# Patient Record
Sex: Female | Born: 1955
Health system: Southern US, Community
[De-identification: ages and names within clinical notes are randomized; demographics above are authoritative.]

## PROBLEM LIST (undated history)

## (undated) DIAGNOSIS — M1990 Unspecified osteoarthritis, unspecified site: Secondary | ICD-10-CM

## (undated) DIAGNOSIS — F329 Major depressive disorder, single episode, unspecified: Secondary | ICD-10-CM

## (undated) DIAGNOSIS — F32A Depression, unspecified: Secondary | ICD-10-CM

## (undated) DIAGNOSIS — B009 Herpesviral infection, unspecified: Secondary | ICD-10-CM

## (undated) DIAGNOSIS — M4302 Spondylolysis, cervical region: Secondary | ICD-10-CM

## (undated) DIAGNOSIS — E785 Hyperlipidemia, unspecified: Secondary | ICD-10-CM

## (undated) DIAGNOSIS — M419 Scoliosis, unspecified: Secondary | ICD-10-CM

## (undated) HISTORY — DX: Herpesviral infection, unspecified: B00.9

## (undated) HISTORY — DX: Depression, unspecified: F32.A

## (undated) HISTORY — PX: OTHER SURGICAL HISTORY: SHX169

## (undated) HISTORY — PX: PLACEMENT OF BREAST IMPLANTS: SHX6334

## (undated) HISTORY — DX: Major depressive disorder, single episode, unspecified: F32.9

---

## 1999-01-22 ENCOUNTER — Other Ambulatory Visit: Admission: RE | Admit: 1999-01-22 | Discharge: 1999-01-22 | Payer: Self-pay | Admitting: Obstetrics & Gynecology

## 2000-04-16 ENCOUNTER — Other Ambulatory Visit: Admission: RE | Admit: 2000-04-16 | Discharge: 2000-04-16 | Payer: Self-pay | Admitting: Obstetrics & Gynecology

## 2001-04-13 ENCOUNTER — Other Ambulatory Visit: Admission: RE | Admit: 2001-04-13 | Discharge: 2001-04-13 | Payer: Self-pay | Admitting: Obstetrics & Gynecology

## 2001-06-11 ENCOUNTER — Ambulatory Visit (HOSPITAL_COMMUNITY): Admission: RE | Admit: 2001-06-11 | Discharge: 2001-06-11 | Payer: Self-pay | Admitting: *Deleted

## 2001-06-11 ENCOUNTER — Encounter: Payer: Self-pay | Admitting: *Deleted

## 2002-03-22 ENCOUNTER — Other Ambulatory Visit: Admission: RE | Admit: 2002-03-22 | Discharge: 2002-03-22 | Payer: Self-pay | Admitting: Obstetrics & Gynecology

## 2002-06-10 ENCOUNTER — Observation Stay (HOSPITAL_COMMUNITY): Admission: AD | Admit: 2002-06-10 | Discharge: 2002-06-11 | Payer: Self-pay | Admitting: Obstetrics and Gynecology

## 2002-06-10 ENCOUNTER — Encounter: Payer: Self-pay | Admitting: Emergency Medicine

## 2003-07-05 ENCOUNTER — Other Ambulatory Visit: Admission: RE | Admit: 2003-07-05 | Discharge: 2003-07-05 | Payer: Self-pay | Admitting: Obstetrics & Gynecology

## 2004-02-13 ENCOUNTER — Encounter: Admission: RE | Admit: 2004-02-13 | Discharge: 2004-02-13 | Payer: Self-pay | Admitting: Family Medicine

## 2004-08-17 ENCOUNTER — Ambulatory Visit (HOSPITAL_COMMUNITY): Admission: RE | Admit: 2004-08-17 | Discharge: 2004-08-17 | Payer: Self-pay | Admitting: Orthopedic Surgery

## 2004-08-23 ENCOUNTER — Other Ambulatory Visit: Admission: RE | Admit: 2004-08-23 | Discharge: 2004-08-23 | Payer: Self-pay | Admitting: *Deleted

## 2004-09-07 ENCOUNTER — Ambulatory Visit (HOSPITAL_COMMUNITY): Admission: RE | Admit: 2004-09-07 | Discharge: 2004-09-07 | Payer: Self-pay | Admitting: Obstetrics and Gynecology

## 2004-09-10 ENCOUNTER — Other Ambulatory Visit: Admission: RE | Admit: 2004-09-10 | Discharge: 2004-09-10 | Payer: Self-pay | Admitting: *Deleted

## 2004-10-31 ENCOUNTER — Other Ambulatory Visit: Admission: RE | Admit: 2004-10-31 | Discharge: 2004-10-31 | Payer: Self-pay | Admitting: Obstetrics and Gynecology

## 2005-01-14 HISTORY — PX: APPENDECTOMY: SHX54

## 2005-07-12 ENCOUNTER — Other Ambulatory Visit: Admission: RE | Admit: 2005-07-12 | Discharge: 2005-07-12 | Payer: Self-pay | Admitting: Obstetrics and Gynecology

## 2005-07-12 ENCOUNTER — Other Ambulatory Visit: Admission: RE | Admit: 2005-07-12 | Discharge: 2005-07-12 | Payer: Self-pay | Admitting: *Deleted

## 2005-11-08 ENCOUNTER — Other Ambulatory Visit: Admission: RE | Admit: 2005-11-08 | Discharge: 2005-11-08 | Payer: Self-pay | Admitting: Obstetrics and Gynecology

## 2005-11-26 ENCOUNTER — Ambulatory Visit: Admission: RE | Admit: 2005-11-26 | Discharge: 2005-11-26 | Payer: Self-pay | Admitting: Gynecologic Oncology

## 2005-12-24 ENCOUNTER — Ambulatory Visit: Admission: RE | Admit: 2005-12-24 | Discharge: 2005-12-24 | Payer: Self-pay | Admitting: Gynecologic Oncology

## 2006-01-14 ENCOUNTER — Inpatient Hospital Stay (HOSPITAL_COMMUNITY): Admission: RE | Admit: 2006-01-14 | Discharge: 2006-01-21 | Payer: Self-pay | Admitting: Obstetrics & Gynecology

## 2006-01-14 HISTORY — PX: ABDOMINAL HYSTERECTOMY: SHX81

## 2006-02-06 ENCOUNTER — Ambulatory Visit (HOSPITAL_COMMUNITY): Admission: RE | Admit: 2006-02-06 | Discharge: 2006-02-06 | Payer: Self-pay | Admitting: Gynecologic Oncology

## 2006-02-09 ENCOUNTER — Emergency Department (HOSPITAL_COMMUNITY): Admission: EM | Admit: 2006-02-09 | Discharge: 2006-02-09 | Payer: Self-pay | Admitting: Emergency Medicine

## 2006-02-12 ENCOUNTER — Ambulatory Visit: Admission: RE | Admit: 2006-02-12 | Discharge: 2006-02-13 | Payer: Self-pay | Admitting: Obstetrics and Gynecology

## 2006-03-04 ENCOUNTER — Ambulatory Visit: Admission: RE | Admit: 2006-03-04 | Discharge: 2006-03-04 | Payer: Self-pay | Admitting: Gynecologic Oncology

## 2006-04-14 HISTORY — PX: VAGINAL PROLAPSE REPAIR: SHX830

## 2012-03-24 ENCOUNTER — Encounter (HOSPITAL_COMMUNITY): Payer: Self-pay | Admitting: Emergency Medicine

## 2012-03-24 ENCOUNTER — Emergency Department (HOSPITAL_COMMUNITY): Payer: Self-pay

## 2012-03-24 ENCOUNTER — Emergency Department (HOSPITAL_COMMUNITY)
Admission: EM | Admit: 2012-03-24 | Discharge: 2012-03-24 | Disposition: A | Discharge status: Home / Self Care | Payer: Self-pay | Attending: Emergency Medicine | Admitting: Emergency Medicine

## 2012-03-24 DIAGNOSIS — M5090 Cervical disc disorder, unspecified, unspecified cervical region: Secondary | ICD-10-CM | POA: Insufficient documentation

## 2012-03-24 DIAGNOSIS — R209 Unspecified disturbances of skin sensation: Secondary | ICD-10-CM | POA: Insufficient documentation

## 2012-03-24 DIAGNOSIS — M542 Cervicalgia: Secondary | ICD-10-CM | POA: Insufficient documentation

## 2012-03-24 MED ORDER — HYDROCODONE-ACETAMINOPHEN 5-325 MG PO TABS: 1.0000 | ORAL_TABLET | ORAL | Status: AC | PRN

## 2012-03-24 MED ORDER — OXYCODONE-ACETAMINOPHEN 5-325 MG PO TABS
1.0000 | ORAL_TABLET | Freq: Once | ORAL | Status: AC
  Administered 2012-03-24: 1 via ORAL
  Filled 2012-03-24: qty 1

## 2012-03-24 MED ORDER — PREDNISONE 10 MG PO TABS: 40.0000 mg | ORAL_TABLET | Freq: Every day | ORAL | Status: DC

## 2012-03-24 MED ORDER — DIAZEPAM 5 MG PO TABS
5.0000 mg | ORAL_TABLET | Freq: Once | ORAL | Status: AC
  Administered 2012-03-24: 5 mg via ORAL
  Filled 2012-03-24: qty 1

## 2012-03-24 MED ORDER — METHOCARBAMOL 500 MG PO TABS: 500.0000 mg | ORAL_TABLET | Freq: Four times a day (QID) | ORAL | Status: AC | PRN

## 2012-03-24 NOTE — Progress Notes (Signed)
Discussed the importance of a pcp for f/u. Reviewed [facility] number to assist with finding self pay provider close to her residence. Reviewed resources for medications, housing, DSS, health Department and other resources in [place] Pt voiced understanding and appreciation of resources provided

## 2012-03-24 NOTE — ED Provider Notes (Signed)
Medical screening examination/treatment/procedure(s) were performed by non-physician practitioner and as supervising physician I was immediately available for consultation/collaboration.  Ethelda Chick, MD 03/24/12 [phone]

## 2012-03-24 NOTE — ED Provider Notes (Signed)
History     CSN: [id number]  Arrival date & time 03/24/12  0906   First MD Initiated Contact with Patient 03/24/12 [phone]      No chief complaint on file.   (Consider location/radiation/quality/duration/timing/severity/associated sxs/prior treatment) The history is provided by the patient.  Pt is a generally healthy 55 y/o F who presents to the ED with c/c of sudden onset left-sided neck pain that began when she woke up on 3/24. There was no known injury. The pain is sharp, non-radiating, moderate severity. There is assoc subjective numbness to the skin of the neck over the place of most severe pain, though she denies any extremity weakness or numbness. Has been taking naproxen and using intermittent heat and ice without much relief. Denies fever, chills, HA, IV drug use, or hx cancer.  History reviewed. No pertinent past medical history.  Past Surgical History  Procedure Date  . Appendectomy   . Abdominal hysterectomy     No family history on file.  History  Substance Use Topics  . Smoking status: Never Smoker   . Smokeless tobacco: Not on file  . Alcohol Use: No     Review of Systems  HENT: Positive for neck pain.   All other systems reviewed and are negative.    Allergies  Sulfa antibiotics  Home Medications   Current Outpatient Rx  Name Route Sig Dispense Refill  . AMPHETAMINE-DEXTROAMPHETAMINE 20 MG PO TABS Oral Take 20 mg by mouth 3 (three) times daily.    Marland Kitchen FLUOXETINE HCL 40 MG PO CAPS Oral Take 40 mg by mouth daily.    Marland Kitchen NAPROXEN 250 MG PO TABS Oral Take 250 mg by mouth 2 (two) times daily with a meal. Pain    . VITAMIN E 1000 UNITS PO CAPS Oral Take 1,000 Units by mouth daily.      BP 144/80  Pulse 81  Temp 98.3 F (36.8 C)  Resp 22  SpO2 100%  Physical Exam  Nursing note and vitals reviewed. Constitutional: She is oriented to person, place, and time. She appears well-developed and well-nourished. No distress.  HENT:  Head: Normocephalic and  atraumatic.  Right Ear: External ear normal.  Left Ear: External ear normal.  Mouth/Throat: Oropharynx is clear and moist.  Eyes: Conjunctivae are normal. Pupils are equal, round, and reactive to light.  Neck: Normal range of motion. Neck supple.       See MSK exam  Cardiovascular: Normal rate, regular rhythm and normal heart sounds.   Pulmonary/Chest: Effort normal and breath sounds normal. No respiratory distress. She has no wheezes. She exhibits no tenderness.  Abdominal: Soft. Bowel sounds are normal. She exhibits no distension. There is no tenderness.  Musculoskeletal: Normal range of motion. She exhibits no edema.       Cervical back: She exhibits normal range of motion, no bony tenderness, no deformity, no spasm and normal pulse.       Back:       Increased pain with side flexion of neck. No change in pain or pain/tingling to the extremities with axial loading.  Lymphadenopathy:    She has no cervical adenopathy.  Neurological: She is alert and oriented to person, place, and time. No cranial nerve deficit.  Skin: Skin is warm and dry. No rash noted.  Psychiatric: She has a normal mood and affect.    ED Course  Procedures (including critical care time)  Labs Reviewed - No data to display Dg Cervical Spine Complete  03/24/2012  *RADIOLOGY  REPORT*  Clinical Data: Left-sided neck pain.  No injury.  CERVICAL SPINE - COMPLETE 4+ VIEW  Comparison: None.  Findings: The cervical spine is visualized the skull base through the cervicothoracic junction.  There is significant loss of disc height and endplate changes at C5-6 and C6-7.  Degenerative anterolisthesis and exaggerated flexion is present at C4-5. Osteophyte formation and facet spurring leads to bilateral foraminal narrowing at C5-6.  The lung apices are clear.  IMPRESSION:  1.  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 This could certainly account for the patient's symptoms. 2.   No acute fracture or traumatic subluxation.   Original Report Authenticated By: Jamesetta Orleans. MATTERN, M.D.     Dx 1: neck pain   MDM  Neck pain with NKI. Examination without neuro or motor deficit. Pain reproducible. X-ray without evidence of acute changes, though spondylosis could be contributing to pt pain. I suspect mainly muscular component with inflammation causing prolonged course. Will give rx for pain medication as well as short course of steroids.        Shaaron Adler, PA-C 03/24/12 1200

## 2012-03-24 NOTE — Discharge Instructions (Signed)
Cervical Radiculopathy Cervical radiculopathy happens when a nerve in the neck is pinched or bruised by a slipped (herniated) disk or by arthritic changes in the bones of the cervical spine. This can occur due to an injury or as part of the normal aging process. Pressure on the cervical nerves can cause pain or numbness that runs from your neck all the way down into your arm and fingers. CAUSES  There are many possible causes, including:  Injury.   Muscle tightness in the neck from overuse.   Swollen, painful joints (arthritis).   Breakdown or degeneration in the bones and joints of the spine (spondylosis) due to aging.   Bone spurs that may develop near the cervical nerves.  SYMPTOMS  Symptoms include pain, weakness, or numbness in the affected arm and hand. Pain can be severe or irritating. Symptoms may be worse when extending or turning the neck. DIAGNOSIS  Your caregiver will ask about your symptoms and do a physical exam. He or she may test your strength and reflexes. X-rays, CT scans, and MRI scans may be needed in cases of injury or if the symptoms do not go away after a period of time. Electromyography (EMG) or nerve conduction testing may be done to study how your nerves and muscles are working. TREATMENT  Your caregiver may recommend certain exercises to help relieve your symptoms. Cervical radiculopathy can, and often does, get better with time and treatment. If your problems continue, treatment options may include:  Wearing a soft collar for short periods of time.   Physical therapy to strengthen the neck muscles.   Medicines, such as nonsteroidal anti-inflammatory drugs (NSAIDs), oral corticosteroids, or spinal injections.   Surgery. Different types of surgery may be done depending on the cause of your problems.  HOME CARE INSTRUCTIONS   Put ice on the affected area.   Put ice in a plastic bag.   Place a towel between your skin and the bag.   Leave the ice on for 15  to 20 minutes, 3 to 4 times a day or as directed by your caregiver.   Use a flat pillow when you sleep.   Only take over-the-counter or prescription medicines for pain, discomfort, or fever as directed by your caregiver.   If physical therapy was prescribed, follow your caregiver's directions.   If a soft collar was prescribed, use it as directed.  SEEK IMMEDIATE MEDICAL CARE IF:   Your pain gets much worse and cannot be controlled with medicines.   You have weakness or numbness in your hand, arm, face, or leg.   You have a high fever or a stiff, rigid neck.   You lose bowel or bladder control (incontinence).   You have trouble with walking, balance, or speaking.  MAKE SURE YOU:   Understand these instructions.   Will watch your condition.   Will get help right away if you are not doing well or get worse.  Document Released: 09/03/2001 Document Revised: 11/28/2011 Document Reviewed: 07/23/2011 [employer] Patient Information 2012 [employer]. 

## 2012-03-24 NOTE — ED Notes (Signed)
Pt reports constant neck pain that started on March 24th and is a 7/10 and can be worse at times especially at night. Pain makes it difficult to sleep. Pain does not radiate and she reports some numbness in the area. Pt able to ambulate without difficulty and in NAD.

## 2012-03-27 ENCOUNTER — Emergency Department (HOSPITAL_COMMUNITY)
Admission: EM | Admit: 2012-03-27 | Discharge: 2012-03-27 | Discharge status: Against Medical Advice | Payer: Self-pay | Attending: Emergency Medicine | Admitting: Emergency Medicine

## 2012-03-27 ENCOUNTER — Other Ambulatory Visit: Payer: Self-pay

## 2012-03-27 ENCOUNTER — Encounter (HOSPITAL_COMMUNITY): Payer: Self-pay | Admitting: Family Medicine

## 2012-03-27 DIAGNOSIS — M79609 Pain in unspecified limb: Secondary | ICD-10-CM | POA: Insufficient documentation

## 2012-03-27 DIAGNOSIS — M542 Cervicalgia: Secondary | ICD-10-CM | POA: Insufficient documentation

## 2012-03-27 DIAGNOSIS — R209 Unspecified disturbances of skin sensation: Secondary | ICD-10-CM | POA: Insufficient documentation

## 2012-03-27 HISTORY — DX: Scoliosis, unspecified: M41.9

## 2012-03-27 HISTORY — DX: Unspecified osteoarthritis, unspecified site: M19.90

## 2012-03-27 HISTORY — DX: Spondylolysis, cervical region: M43.02

## 2012-03-27 NOTE — ED Notes (Signed)
Pt reports new numbness and pain to left arm.

## 2012-03-27 NOTE — ED Notes (Signed)
Pt reports she fell and broke her nose in March and thinks she injured her neck. States on 03/14/12 she began having severe neck pain to back of neck. States she was seen here on Tuesday of this week and was discharged with pain meds.  States pain has gotten worse. NAD noted at this time.

## 2014-10-30 ENCOUNTER — Emergency Department (HOSPITAL_COMMUNITY)
Admission: EM | Admit: 2014-10-30 | Discharge: 2014-10-30 | Disposition: A | Discharge status: Home / Self Care | Payer: Self-pay | Attending: Emergency Medicine | Admitting: Emergency Medicine

## 2014-10-30 ENCOUNTER — Encounter (HOSPITAL_COMMUNITY): Payer: Self-pay | Admitting: Emergency Medicine

## 2014-10-30 DIAGNOSIS — F329 Major depressive disorder, single episode, unspecified: Secondary | ICD-10-CM | POA: Insufficient documentation

## 2014-10-30 DIAGNOSIS — F32A Depression, unspecified: Secondary | ICD-10-CM

## 2014-10-30 DIAGNOSIS — Z79899 Other long term (current) drug therapy: Secondary | ICD-10-CM | POA: Insufficient documentation

## 2014-10-30 DIAGNOSIS — R197 Diarrhea, unspecified: Secondary | ICD-10-CM | POA: Insufficient documentation

## 2014-10-30 DIAGNOSIS — M1990 Unspecified osteoarthritis, unspecified site: Secondary | ICD-10-CM | POA: Insufficient documentation

## 2014-10-30 DIAGNOSIS — Z7952 Long term (current) use of systemic steroids: Secondary | ICD-10-CM | POA: Insufficient documentation

## 2014-10-30 LAB — CBC
HCT: 41.2 % (ref 36.0–46.0)
Hemoglobin: 14.3 g/dL (ref 12.0–15.0)
MCH: 33.1 pg (ref 26.0–34.0)
MCHC: 34.7 g/dL (ref 30.0–36.0)
MCV: 95.4 fL (ref 78.0–100.0)
Platelets: 268 10*3/uL (ref 150–400)
RBC: 4.32 MIL/uL (ref 3.87–5.11)
RDW: 12.9 % (ref 11.5–15.5)
WBC: 5.6 10*3/uL (ref 4.0–10.5)

## 2014-10-30 LAB — RAPID URINE DRUG SCREEN, HOSP PERFORMED
AMPHETAMINES: NOT DETECTED
Barbiturates: NOT DETECTED
Benzodiazepines: NOT DETECTED
Cocaine: NOT DETECTED
OPIATES: NOT DETECTED
TETRAHYDROCANNABINOL: NOT DETECTED

## 2014-10-30 LAB — URINALYSIS, ROUTINE W REFLEX MICROSCOPIC
Bilirubin Urine: NEGATIVE
GLUCOSE, UA: NEGATIVE mg/dL
HGB URINE DIPSTICK: NEGATIVE
KETONES UR: NEGATIVE mg/dL
Leukocytes, UA: NEGATIVE
NITRITE: NEGATIVE
PH: 5.5 (ref 5.0–8.0)
PROTEIN: NEGATIVE mg/dL
Specific Gravity, Urine: 1.011 (ref 1.005–1.030)
Urobilinogen, UA: 0.2 mg/dL (ref 0.0–1.0)

## 2014-10-30 LAB — COMPREHENSIVE METABOLIC PANEL
ALBUMIN: 3.8 g/dL (ref 3.5–5.2)
ALT: 13 U/L (ref 0–35)
AST: 18 U/L (ref 0–37)
Alkaline Phosphatase: 53 U/L (ref 39–117)
Anion gap: 14 (ref 5–15)
BILIRUBIN TOTAL: 0.3 mg/dL (ref 0.3–1.2)
BUN: 10 mg/dL (ref 6–23)
CHLORIDE: 105 meq/L (ref 96–112)
CO2: 22 mEq/L (ref 19–32)
Calcium: 9.4 mg/dL (ref 8.4–10.5)
Creatinine, Ser: 0.76 mg/dL (ref 0.50–1.10)
GFR calc Af Amer: >90 mL/min (ref >90)
GLUCOSE: 133 mg/dL — AB (ref 70–99)
POTASSIUM: 4.1 meq/L (ref 3.7–5.3)
Sodium: 141 mEq/L (ref 137–147)
Total Protein: 7 g/dL (ref 6.0–8.3)

## 2014-10-30 LAB — SALICYLATE LEVEL: Salicylate Lvl: <2 mg/dL — ABNORMAL LOW (ref 2.8–20.0)

## 2014-10-30 LAB — ETHANOL

## 2014-10-30 LAB — ACETAMINOPHEN LEVEL

## 2014-10-30 MED ORDER — NICOTINE 21 MG/24HR TD PT24: 21.0000 mg | MEDICATED_PATCH | Freq: Every day | TRANSDERMAL | Status: DC

## 2014-10-30 NOTE — Discharge Instructions (Signed)
Depression °Depression refers to feeling sad, low, down in the dumps, blue, gloomy, or empty. In general, there are two kinds of depression: °· Normal sadness or normal grief. This kind of depression is one that we all feel from time to time after upsetting life experiences, such as the loss of a job or the ending of a relationship. This kind of depression is considered normal, is short lived, and resolves within a few days to 2 weeks. Depression experienced after the loss of a loved one (bereavement) often lasts longer than 2 weeks but normally gets better with time. °· Clinical depression. This kind of depression lasts longer than normal sadness or normal grief or interferes with your ability to function at home, at work, and in school. It also interferes with your personal relationships. It affects almost every aspect of your life. Clinical depression is an illness. °Symptoms of depression can also be caused by conditions other than those mentioned above, such as: °· Physical illness. Some physical illnesses, including underactive thyroid gland (hypothyroidism), severe anemia, specific types of cancer, diabetes, uncontrolled seizures, heart and lung problems, strokes, and chronic pain are commonly associated with symptoms of depression. °· Side effects of some prescription medicine. In some people, certain types of medicine can cause symptoms of depression. °· Substance abuse. Abuse of alcohol and illicit drugs can cause symptoms of depression. °SYMPTOMS °Symptoms of normal sadness and normal grief include the following: °· Feeling sad or crying for short periods of time. °· Not caring about anything (apathy). °· Difficulty sleeping or sleeping too much. °· No longer able to enjoy the things you used to enjoy. °· Desire to be by oneself all the time (social isolation). °· Lack of energy or motivation. °· Difficulty concentrating or remembering. °· Change in appetite or weight. °· Restlessness or  agitation. °Symptoms of clinical depression include the same symptoms of normal sadness or normal grief and also the following symptoms: °· Feeling sad or crying all the time. °· Feelings of guilt or worthlessness. °· Feelings of hopelessness or helplessness. °· Thoughts of suicide or the desire to harm yourself (suicidal ideation). °· Loss of touch with reality (psychotic symptoms). Seeing or hearing things that are not real (hallucinations) or having false beliefs about your life or the people around you (delusions and paranoia). °DIAGNOSIS  °The diagnosis of clinical depression is usually based on how bad the symptoms are and how long they have lasted. Your health care provider will also ask you questions about your medical history and substance use to find out if physical illness, use of prescription medicine, or substance abuse is causing your depression. Your health care provider may also order blood tests. °TREATMENT  °Often, normal sadness and normal grief do not require treatment. However, sometimes antidepressant medicine is given for bereavement to ease the depressive symptoms until they resolve. °The treatment for clinical depression depends on how bad the symptoms are but often includes antidepressant medicine, counseling with a mental health professional, or both. Your health care provider will help to determine what treatment is best for you. °Depression caused by physical illness usually goes away with appropriate medical treatment of the illness. If prescription medicine is causing depression, talk with your health care provider about stopping the medicine, decreasing the dose, or changing to another medicine. °Depression caused by the abuse of alcohol or illicit drugs goes away when you stop using these substances. Some adults need professional help in order to stop drinking or using drugs. °SEEK IMMEDIATE MEDICAL   CARE IF:  You have thoughts about hurting yourself or others.  You lose touch  with reality (have psychotic symptoms).  You are taking medicine for depression and have a serious side effect. FOR MORE INFORMATION  National Alliance on Mental Illness: www.nami.[employer]  National Institute of Mental Health: http://www.maynard.net/www.nimh.nih.gov Document Released: 12/06/2000 Document Revised: 04/25/2014 Document Reviewed: 03/09/2012 [facility] Patient Information 2015 [place], [place]. This information is not intended to replace advice given to you by your health care provider. Make sure you discuss any questions you have with your health care provider.  Diarrhea Diarrhea is frequent loose and watery bowel movements. It can cause you to feel weak and dehydrated. Dehydration can cause you to become tired and thirsty, have a dry mouth, and have decreased urination that often is dark yellow. Diarrhea is a sign of another problem, most often an infection that will not last long. In most cases, diarrhea typically lasts 2-3 days. However, it can last longer if it is a sign of something more serious. It is important to treat your diarrhea as directed by your caregiver to lessen or prevent future episodes of diarrhea. CAUSES  Some common causes include:  Gastrointestinal infections caused by viruses, bacteria, or parasites.  Food poisoning or food allergies.  Certain medicines, such as antibiotics, chemotherapy, and laxatives.  Artificial sweeteners and fructose.  Digestive disorders. HOME CARE INSTRUCTIONS  Ensure adequate fluid intake (hydration): Have 1 cup (8 oz) of fluid for each diarrhea episode. Avoid fluids that contain simple sugars or sports drinks, fruit juices, whole milk products, and sodas. Your urine should be clear or pale yellow if you are drinking enough fluids. Hydrate with an oral rehydration solution that you can purchase at pharmacies, retail stores, and online. You can prepare an oral rehydration solution at home by mixing the following ingredients together:   - tsp table  salt.   tsp baking soda.   tsp salt substitute containing potassium chloride.  1  tablespoons sugar.  1 L (34 oz) of water.  Certain foods and beverages may increase the speed at which food moves through the gastrointestinal (GI) tract. These foods and beverages should be avoided and include:  Caffeinated and alcoholic beverages.  High-fiber foods, such as raw fruits and vegetables, nuts, seeds, and whole grain breads and cereals.  Foods and beverages sweetened with sugar alcohols, such as xylitol, sorbitol, and mannitol.  Some foods may be well tolerated and may help thicken stool including:  Starchy foods, such as rice, toast, pasta, low-sugar cereal, oatmeal, grits, baked potatoes, crackers, and bagels.  Bananas.  Applesauce.  Add probiotic-rich foods to help increase healthy bacteria in the GI tract, such as yogurt and fermented milk products.  Wash your hands well after each diarrhea episode.  Only take over-the-counter or prescription medicines as directed by your caregiver.  Take a warm bath to relieve any burning or pain from frequent diarrhea episodes. SEEK IMMEDIATE MEDICAL CARE IF:   You are unable to keep fluids down.  You have persistent vomiting.  You have blood in your stool, or your stools are black and tarry.  You do not urinate in 6-8 hours, or there is only a small amount of very dark urine.  You have abdominal pain that increases or localizes.  You have weakness, dizziness, confusion, or light-headedness.  You have a severe headache.  Your diarrhea gets worse or does not get better.  You have a fever or persistent symptoms for more than 2-3 days.  You have a  fever and your symptoms suddenly get worse. MAKE SURE YOU:   Understand these instructions.  Will watch your condition.  Will get help right away if you are not doing well or get worse. Document Released: 11/29/2002 Document Revised: 04/25/2014 Document Reviewed:  08/16/2012 [facility] Patient Information 2015 [place]ExitCare, [place]. This information is not intended to replace advice given to you by your health care provider. Make sure you discuss any questions you have with your health care provider.

## 2014-10-30 NOTE — ED Notes (Signed)
Pt given discharge instructions to include quitting smoking.

## 2014-10-30 NOTE — ED Notes (Signed)
Pt states that for the past 2 weeks she has had explosive diarrhea denies any blood or dark stools, denies that anything makes it better or worse. Pt states that food "just goes through her" denies any abd pain. Has not seen anyone for this. Pt also spoke with [facility] and they told pt to come to the ER for medical clearance and detox from prescribed medications such as trazadone. She wants to come off the medication. She has an appointment with them on Tues. Denies any SI/Hi.

## 2014-10-30 NOTE — ED Provider Notes (Signed)
CSN: [id number]     Arrival date & time 10/30/14  1152 History   First MD Initiated Contact with Patient 10/30/14 1233     Chief Complaint  Patient presents with  . Diarrhea  . Drug / Alcohol Assessment     (Consider location/radiation/quality/duration/timing/severity/associated sxs/prior Treatment) HPI Comments: Patient [facility]. She states that she would like to get off of her clonazepam, Seroquel, Zoloft. She denies any other drug use or alcohol use. She denies SI or HI but does report she has been depressed for some time due to her current social situation. She also reports about 6 weeks of diarrhea that occurs after she eats. She has anywhere from 1-3 episodes a day about 45 minutes an hour 50 minutes after by mouth intake area distal she states looks like "whenever she eats". Nonbloody. She's had no recent travel, no fevers, no recent healthcare admissions, no sick contacts.  Patient is a 58 y.o. female presenting with diarrhea and drug/alcohol assessment.  Diarrhea Associated symptoms: no abdominal pain, no arthralgias, no chills, no diaphoresis, no fever, no headaches and no vomiting   Drug / Alcohol Assessment Associated symptoms: no abdominal pain, no agitation, no confusion, no headaches, no nausea, no palpitations, no shortness of breath, no vomiting and no weakness     Past Medical History  Diagnosis Date  . Spondylolysis of cervical region   . Scoliosis   . Arthritis    Past Surgical History  Procedure Laterality Date  . Appendectomy    . Abdominal hysterectomy     No family history on file. History  Substance Use Topics  . Smoking status: Never Smoker   . Smokeless tobacco: Not on file  . Alcohol Use: No   OB History    No data available     Review of Systems  Constitutional: Negative for fever, chills, diaphoresis, activity change, appetite change and fatigue.  HENT: Negative for congestion, facial swelling,  rhinorrhea and sore throat.   Eyes: Negative for photophobia and discharge.  Respiratory: Negative for cough, chest tightness and shortness of breath.   Cardiovascular: Negative for chest pain, palpitations and leg swelling.  Gastrointestinal: Positive for diarrhea. Negative for nausea, vomiting and abdominal pain.  Endocrine: Negative for polydipsia and polyuria.  Genitourinary: Negative for dysuria, frequency, difficulty urinating and pelvic pain.  Musculoskeletal: Negative for back pain, arthralgias, neck pain and neck stiffness.  Skin: Negative for color change and wound.  Allergic/Immunologic: Negative for immunocompromised state.  Neurological: Negative for facial asymmetry, weakness, numbness and headaches.  Hematological: Does not bruise/bleed easily.  Psychiatric/Behavioral: Negative for confusion and agitation.      Allergies  Sulfa antibiotics  Home Medications   Prior to Admission medications   Medication Sig Start Date End Date Taking? Authorizing Provider  clonazePAM (KLONOPIN) 0.5 MG tablet Take 0.5 mg by mouth 2 (two) times daily as needed for anxiety.   Yes Historical Provider, MD  QUEtiapine (SEROQUEL XR) 300 MG 24 hr tablet Take 300 mg by mouth at bedtime.   Yes Historical Provider, MD  sertraline (ZOLOFT) 100 MG tablet Take 100 mg by mouth daily.   Yes Historical Provider, MD  amphetamine-dextroamphetamine (ADDERALL) 20 MG tablet Take 20 mg by mouth 3 (three) times daily.    Historical Provider, MD  FLUoxetine (PROZAC) 40 MG capsule Take 40 mg by mouth daily.    Historical Provider, MD  nicotine (NICODERM CQ) 21 mg/24hr patch Place 1 patch (21 mg total) onto the  skin daily. 10/30/14   Toy CookeyMegan Eitan Doubleday, MD  predniSONE (DELTASONE) 10 MG tablet Take 4 tablets (40 mg total) by mouth daily. 03/24/12   Shaaron AdlerStephanie Justice Wolfe, PA-C  vitamin E (VITAMIN E) 1000 UNIT capsule Take 1,000 Units by mouth daily.    Historical Provider, MD   BP 133/79 mmHg  Pulse 77  Temp(Src) 98.8  F (37.1 C) (Oral)  Resp 16  SpO2 100% Physical Exam  Constitutional: She is oriented to person, place, and time. She appears well-developed and well-nourished. No distress.  HENT:  Head: Normocephalic and atraumatic.  Mouth/Throat: No oropharyngeal exudate.  Eyes: Pupils are equal, round, and reactive to light.  Neck: Normal range of motion. Neck supple.  Cardiovascular: Normal rate, regular rhythm and normal heart sounds.  Exam reveals no gallop and no friction rub.   No murmur heard. Pulmonary/Chest: Effort normal and breath sounds normal. No respiratory distress. She has no wheezes. She has no rales.  Abdominal: Soft. Bowel sounds are normal. She exhibits no distension and no mass. There is no tenderness. There is no rebound and no guarding.  Musculoskeletal: Normal range of motion. She exhibits no edema or tenderness.  Neurological: She is alert and oriented to person, place, and time.  Skin: Skin is warm and dry.  Psychiatric: Thought content is not paranoid and not delusional. She exhibits a depressed mood. She expresses no homicidal and no suicidal ideation. She expresses no suicidal plans and no homicidal plans.    ED Course  Procedures (including critical care time) Labs Review Labs Reviewed  COMPREHENSIVE METABOLIC PANEL - Abnormal; Notable for the following:    Glucose, Bld 133 (*)    All other components within normal limits  SALICYLATE LEVEL - Abnormal; Notable for the following:    Salicylate Lvl <2.0 (*)    All other components within normal limits  URINE CULTURE  CLOSTRIDIUM DIFFICILE BY PCR  STOOL CULTURE  ACETAMINOPHEN LEVEL  CBC  ETHANOL  URINALYSIS, ROUTINE W REFLEX MICROSCOPIC  URINE RAPID DRUG SCREEN (HOSP PERFORMED)    Imaging Review No results found.   EKG Interpretation None      MDM   Final diagnoses:  Depression  Diarrhea    Pt is a 58 y.o. female with Pmhx as above who presents with request for medical clearance treatment [facility].  Patient states that she is on multiple medications that she would like to get off of including Seroquel, Zoloft, clonazepam. She has been taking the clonazepam regularly twice a day as directed for about one month. She takes this Seroquel intermittently for sleep and takes Zoloft daily. She denies any other drug use or alcohol use. She states that she has been depressed for several years due to losing her job and social situation. She denies SI or HI as well as AVH. She is a secondary complaint of diarrhea for about 6 weeks. Stool is non bloody. Abdominal exam is benign.  Chief she has no risk factors for Clostridium difficile. I do not suspect that this is an infectious diarrhea. Patient did not have episode of diarrhea in the emergency Department and could not send a sample to the lab.  She is medically clear until she can follow-up with [facility] as planned. I will also refer her to GI for her ongoing diarrhea. I have recommended trial of increased fiber and/or probiotics.      Toy CookeyMegan Jamilya Sarrazin, MD 10/30/14 [phone]

## 2014-11-01 LAB — URINE CULTURE
CULTURE: NO GROWTH
Colony Count: NO GROWTH
SPECIAL REQUESTS: NORMAL

## 2015-03-07 ENCOUNTER — Encounter (HOSPITAL_COMMUNITY): Payer: Self-pay | Admitting: Emergency Medicine

## 2015-03-07 ENCOUNTER — Emergency Department (HOSPITAL_COMMUNITY): Payer: Self-pay

## 2015-03-07 ENCOUNTER — Inpatient Hospital Stay (HOSPITAL_COMMUNITY)
Admission: EM | Admit: 2015-03-07 | Discharge: 2015-03-12 | DRG: 871 | Disposition: A | Discharge status: Home / Self Care | Payer: Self-pay | Attending: Internal Medicine | Admitting: Internal Medicine

## 2015-03-07 ENCOUNTER — Other Ambulatory Visit (HOSPITAL_COMMUNITY): Payer: Self-pay

## 2015-03-07 DIAGNOSIS — A419 Sepsis, unspecified organism: Principal | ICD-10-CM | POA: Diagnosis present

## 2015-03-07 DIAGNOSIS — F329 Major depressive disorder, single episode, unspecified: Secondary | ICD-10-CM | POA: Diagnosis present

## 2015-03-07 DIAGNOSIS — G4700 Insomnia, unspecified: Secondary | ICD-10-CM | POA: Diagnosis present

## 2015-03-07 DIAGNOSIS — J9601 Acute respiratory failure with hypoxia: Secondary | ICD-10-CM | POA: Diagnosis present

## 2015-03-07 DIAGNOSIS — R05 Cough: Secondary | ICD-10-CM

## 2015-03-07 DIAGNOSIS — Z720 Tobacco use: Secondary | ICD-10-CM | POA: Diagnosis present

## 2015-03-07 DIAGNOSIS — F32A Depression, unspecified: Secondary | ICD-10-CM | POA: Diagnosis present

## 2015-03-07 DIAGNOSIS — E871 Hypo-osmolality and hyponatremia: Secondary | ICD-10-CM | POA: Diagnosis present

## 2015-03-07 DIAGNOSIS — R059 Cough, unspecified: Secondary | ICD-10-CM

## 2015-03-07 DIAGNOSIS — J189 Pneumonia, unspecified organism: Secondary | ICD-10-CM | POA: Diagnosis present

## 2015-03-07 DIAGNOSIS — J1001 Influenza due to other identified influenza virus with the same other identified influenza virus pneumonia: Secondary | ICD-10-CM | POA: Diagnosis present

## 2015-03-07 DIAGNOSIS — F17210 Nicotine dependence, cigarettes, uncomplicated: Secondary | ICD-10-CM | POA: Diagnosis present

## 2015-03-07 DIAGNOSIS — E860 Dehydration: Secondary | ICD-10-CM | POA: Diagnosis present

## 2015-03-07 DIAGNOSIS — Z8701 Personal history of pneumonia (recurrent): Secondary | ICD-10-CM

## 2015-03-07 LAB — BASIC METABOLIC PANEL
Anion gap: 8 (ref 5–15)
BUN: 7 mg/dL (ref 6–23)
CHLORIDE: 98 mmol/L (ref 96–112)
CO2: 26 mmol/L (ref 19–32)
Calcium: 8.5 mg/dL (ref 8.4–10.5)
Creatinine, Ser: 0.62 mg/dL (ref 0.50–1.10)
GFR calc Af Amer: >90 mL/min (ref >90)
Glucose, Bld: 117 mg/dL — ABNORMAL HIGH (ref 70–99)
POTASSIUM: 3.7 mmol/L (ref 3.5–5.1)
SODIUM: 132 mmol/L — AB (ref 135–145)

## 2015-03-07 LAB — CBC
HCT: 39 % (ref 36.0–46.0)
Hemoglobin: 13.2 g/dL (ref 12.0–15.0)
MCH: 32.2 pg (ref 26.0–34.0)
MCHC: 33.8 g/dL (ref 30.0–36.0)
MCV: 95.1 fL (ref 78.0–100.0)
PLATELETS: 161 10*3/uL (ref 150–400)
RBC: 4.1 MIL/uL (ref 3.87–5.11)
RDW: 12.7 % (ref 11.5–15.5)
WBC: 3.3 10*3/uL — AB (ref 4.0–10.5)

## 2015-03-07 LAB — EXPECTORATED SPUTUM ASSESSMENT W REFEX TO RESP CULTURE

## 2015-03-07 LAB — CBC WITH DIFFERENTIAL/PLATELET
Basophils Absolute: 0 10*3/uL (ref 0.0–0.1)
Basophils Relative: 0 % (ref 0–1)
EOS PCT: 0 % (ref 0–5)
Eosinophils Absolute: 0 10*3/uL (ref 0.0–0.7)
HCT: 42 % (ref 36.0–46.0)
Hemoglobin: 14.6 g/dL (ref 12.0–15.0)
LYMPHS ABS: 0.8 10*3/uL (ref 0.7–4.0)
LYMPHS PCT: 17 % (ref 12–46)
MCH: 32.4 pg (ref 26.0–34.0)
MCHC: 34.8 g/dL (ref 30.0–36.0)
MCV: 93.3 fL (ref 78.0–100.0)
MONO ABS: 0.3 10*3/uL (ref 0.1–1.0)
Monocytes Relative: 6 % (ref 3–12)
Neutro Abs: 3.3 10*3/uL (ref 1.7–7.7)
Neutrophils Relative %: 77 % (ref 43–77)
Platelets: 167 10*3/uL (ref 150–400)
RBC: 4.5 MIL/uL (ref 3.87–5.11)
RDW: 12.6 % (ref 11.5–15.5)
WBC: 4.4 10*3/uL (ref 4.0–10.5)

## 2015-03-07 LAB — STREP PNEUMONIAE URINARY ANTIGEN: Strep Pneumo Urinary Antigen: NEGATIVE

## 2015-03-07 LAB — I-STAT CG4 LACTIC ACID, ED: Lactic Acid, Venous: 0.77 mmol/L (ref 0.5–2.0)

## 2015-03-07 LAB — TSH: TSH: 0.946 u[IU]/mL (ref 0.350–4.500)

## 2015-03-07 LAB — CREATININE, SERUM
Creatinine, Ser: 0.61 mg/dL (ref 0.50–1.10)
GFR calc Af Amer: >90 mL/min (ref >90)
GFR calc non Af Amer: >90 mL/min (ref >90)

## 2015-03-07 LAB — EXPECTORATED SPUTUM ASSESSMENT W GRAM STAIN, RFLX TO RESP C

## 2015-03-07 LAB — D-DIMER, QUANTITATIVE (NOT AT ARMC): D DIMER QUANT: 0.47 ug{FEU}/mL (ref 0.00–0.48)

## 2015-03-07 MED ORDER — CETYLPYRIDINIUM CHLORIDE 0.05 % MT LIQD
7.0000 mL | Freq: Two times a day (BID) | OROMUCOSAL | Status: DC
  Administered 2015-03-07 – 2015-03-11 (×7): 7 mL via OROMUCOSAL

## 2015-03-07 MED ORDER — IPRATROPIUM-ALBUTEROL 0.5-2.5 (3) MG/3ML IN SOLN
3.0000 mL | Freq: Three times a day (TID) | RESPIRATORY_TRACT | Status: DC
  Administered 2015-03-08 – 2015-03-11 (×8): 3 mL via RESPIRATORY_TRACT
  Filled 2015-03-07 (×10): qty 3

## 2015-03-07 MED ORDER — DEXTROSE 5 % IV SOLN
500.0000 mg | INTRAVENOUS | Status: DC
  Administered 2015-03-07: 500 mg via INTRAVENOUS
  Filled 2015-03-07: qty 500

## 2015-03-07 MED ORDER — DEXTROSE 5 % IV SOLN
1.0000 g | INTRAVENOUS | Status: DC
  Administered 2015-03-08 – 2015-03-12 (×5): 1 g via INTRAVENOUS
  Filled 2015-03-07 (×6): qty 10

## 2015-03-07 MED ORDER — NICOTINE 21 MG/24HR TD PT24
21.0000 mg | MEDICATED_PATCH | Freq: Every day | TRANSDERMAL | Status: DC
  Administered 2015-03-07 – 2015-03-12 (×6): 21 mg via TRANSDERMAL
  Filled 2015-03-07 (×6): qty 1

## 2015-03-07 MED ORDER — OSELTAMIVIR PHOSPHATE 75 MG PO CAPS
75.0000 mg | ORAL_CAPSULE | Freq: Two times a day (BID) | ORAL | Status: AC
  Administered 2015-03-07 – 2015-03-11 (×10): 75 mg via ORAL
  Filled 2015-03-07 (×10): qty 1

## 2015-03-07 MED ORDER — DM-GUAIFENESIN ER 30-600 MG PO TB12
1.0000 | ORAL_TABLET | Freq: Two times a day (BID) | ORAL | Status: DC
  Administered 2015-03-07 – 2015-03-12 (×11): 1 via ORAL
  Filled 2015-03-07 (×12): qty 1

## 2015-03-07 MED ORDER — ACETAMINOPHEN 325 MG PO TABS
650.0000 mg | ORAL_TABLET | Freq: Four times a day (QID) | ORAL | Status: DC | PRN
  Administered 2015-03-07: 650 mg via ORAL
  Filled 2015-03-07 (×3): qty 2

## 2015-03-07 MED ORDER — ALBUTEROL SULFATE (2.5 MG/3ML) 0.083% IN NEBU: 2.5000 mg | INHALATION_SOLUTION | RESPIRATORY_TRACT | Status: DC | PRN

## 2015-03-07 MED ORDER — QUETIAPINE FUMARATE ER 50 MG PO TB24
100.0000 mg | ORAL_TABLET | Freq: Every day | ORAL | Status: DC
  Administered 2015-03-07 – 2015-03-11 (×5): 100 mg via ORAL
  Filled 2015-03-07 (×6): qty 2

## 2015-03-07 MED ORDER — MORPHINE SULFATE 2 MG/ML IJ SOLN
2.0000 mg | Freq: Four times a day (QID) | INTRAMUSCULAR | Status: DC | PRN
  Administered 2015-03-07 – 2015-03-09 (×7): 2 mg via INTRAVENOUS
  Filled 2015-03-07 (×7): qty 1

## 2015-03-07 MED ORDER — MORPHINE SULFATE 2 MG/ML IJ SOLN
2.0000 mg | Freq: Once | INTRAMUSCULAR | Status: AC
  Administered 2015-03-07: 2 mg via INTRAVENOUS
  Filled 2015-03-07: qty 1

## 2015-03-07 MED ORDER — BUSPIRONE HCL 15 MG PO TABS
15.0000 mg | ORAL_TABLET | Freq: Two times a day (BID) | ORAL | Status: DC
  Administered 2015-03-07 – 2015-03-12 (×10): 15 mg via ORAL
  Filled 2015-03-07 (×11): qty 1

## 2015-03-07 MED ORDER — SODIUM CHLORIDE 0.9 % IV SOLN
INTRAVENOUS | Status: AC
  Administered 2015-03-07 – 2015-03-08 (×2): via INTRAVENOUS

## 2015-03-07 MED ORDER — ROPINIROLE HCL 1 MG PO TABS
1.0000 mg | ORAL_TABLET | Freq: Every day | ORAL | Status: DC | PRN
  Administered 2015-03-07 – 2015-03-11 (×5): 2 mg via ORAL
  Filled 2015-03-07 (×9): qty 2

## 2015-03-07 MED ORDER — ACETAMINOPHEN 325 MG PO TABS
650.0000 mg | ORAL_TABLET | Freq: Four times a day (QID) | ORAL | Status: DC | PRN
  Administered 2015-03-07 – 2015-03-10 (×4): 650 mg via ORAL
  Filled 2015-03-07 (×2): qty 2

## 2015-03-07 MED ORDER — DEXTROSE 5 % IV SOLN
500.0000 mg | INTRAVENOUS | Status: DC
  Administered 2015-03-08: 500 mg via INTRAVENOUS
  Filled 2015-03-07 (×2): qty 500

## 2015-03-07 MED ORDER — DEXTROSE 5 % IV SOLN
1.0000 g | INTRAVENOUS | Status: DC
  Administered 2015-03-07: 1 g via INTRAVENOUS
  Filled 2015-03-07: qty 10

## 2015-03-07 MED ORDER — SODIUM CHLORIDE 0.9 % IV BOLUS (SEPSIS)
1000.0000 mL | Freq: Once | INTRAVENOUS | Status: AC
  Administered 2015-03-07: 1000 mL via INTRAVENOUS

## 2015-03-07 MED ORDER — ENOXAPARIN SODIUM 40 MG/0.4ML ~~LOC~~ SOLN
40.0000 mg | SUBCUTANEOUS | Status: DC
  Administered 2015-03-07 – 2015-03-10 (×4): 40 mg via SUBCUTANEOUS
  Filled 2015-03-07 (×6): qty 0.4

## 2015-03-07 MED ORDER — ONDANSETRON HCL 4 MG/2ML IJ SOLN
4.0000 mg | Freq: Once | INTRAMUSCULAR | Status: AC
  Administered 2015-03-07: 4 mg via INTRAVENOUS
  Filled 2015-03-07: qty 2

## 2015-03-07 MED ORDER — IPRATROPIUM-ALBUTEROL 0.5-2.5 (3) MG/3ML IN SOLN
3.0000 mL | RESPIRATORY_TRACT | Status: DC
  Administered 2015-03-07: 3 mL via RESPIRATORY_TRACT
  Filled 2015-03-07: qty 3

## 2015-03-07 MED ORDER — CHLORHEXIDINE GLUCONATE 0.12 % MT SOLN
15.0000 mL | Freq: Two times a day (BID) | OROMUCOSAL | Status: DC
  Administered 2015-03-07 – 2015-03-11 (×9): 15 mL via OROMUCOSAL
  Filled 2015-03-07 (×12): qty 15

## 2015-03-07 MED ORDER — SERTRALINE HCL 50 MG PO TABS
150.0000 mg | ORAL_TABLET | Freq: Every day | ORAL | Status: DC
  Administered 2015-03-07 – 2015-03-12 (×6): 150 mg via ORAL
  Filled 2015-03-07 (×6): qty 1

## 2015-03-07 NOTE — ED Notes (Signed)
Ambulated pt in hall. Pt's HR 112. O2 sat was between 88% and 92% RA.

## 2015-03-07 NOTE — H&P (Signed)
[facility] History and Physical  Cynthia Best ZOX:[id number] DOB: 1956/04/24 DOA: 03/07/2015  Referring physician: Dr. Jaci Carrel PCP: No primary care provider on file.  Specialists:    Chief Complaint: Cough and fever  HPI: Cynthia Best is a 59 y.o. female  With a history of tobacco abuse and depression, presented to the emergency department with complaints of shortness of breath and cough along with fever. Patient states her fever started just today. She also states her her roommate also was sick recently and went to the urgent care but was told he had an upper respiratory infection. Patient has had several coworkers with flu. Patient states she's been coughing however it has worsened.  She does state is productive however she has not been able to spit it out. She denies any chest pain, dizziness, headache. Patient does complain of some back pain with coughing. Patient denies any recent travel. In the emergency department, patient had a chest x-ray was noted to have pneumonia. She did have fever with tachycardia and was started on antibiotics. Oxygen saturations also dropped with ambulation. [facility] was called for admission.  Review of Systems:  Constitutional: Complains of fever, chills, weakness. HEENT: Denies photophobia, eye pain, redness, hearing loss, ear pain, congestion, sore throat, rhinorrhea, sneezing, mouth sores, trouble swallowing, neck pain, neck stiffness and tinnitus.   Respiratory: Complains of shortness of breath and cough. Cardiovascular: Denies chest pain, palpitations and leg swelling.  Gastrointestinal: Denies nausea, vomiting, abdominal pain, diarrhea, constipation, blood in stool and abdominal distention.  Genitourinary: Denies dysuria, urgency, frequency, hematuria, flank pain and difficulty urinating.  Musculoskeletal: Complains of back pain from cough. Skin: Denies pallor, rash and wound.  Neurological: Denies dizziness, seizures, syncope, weakness,  light-headedness, numbness and headaches.  Hematological: Denies adenopathy. Easy bruising, personal or family bleeding history  Psychiatric/Behavioral: Denies suicidal ideation, mood changes, confusion, nervousness, sleep disturbance and agitation  Past Medical History  Diagnosis Date  . Spondylolysis of cervical region   . Scoliosis   . Arthritis    Past Surgical History  Procedure Laterality Date  . Appendectomy    . Abdominal hysterectomy     Social History:  Reports smoking, approximately 1 pack per day, would like to quit. Denies any alcohol or illicit drug use. Currently lives at home with a roommate.  Allergies  Allergen Reactions  . Sulfa Antibiotics Other (See Comments)    Doesn't remember     Family history -Reviewed with patient, no history of hypertension or diabetes, coronary artery disease  Prior to Admission medications   Medication Sig Start Date End Date Taking? Authorizing Provider  busPIRone (BUSPAR) 15 MG tablet Take 15 mg by mouth 2 (two) times daily.   Yes Historical Provider, MD  QUEtiapine (SEROQUEL XR) 200 MG 24 hr tablet Take 100 mg by mouth at bedtime.   Yes Historical Provider, MD  rOPINIRole (REQUIP) 1 MG tablet Take 1-2 mg by mouth daily as needed (restless leg syndrome.).   Yes Historical Provider, MD  sertraline (ZOLOFT) 100 MG tablet Take 150 mg by mouth daily.    Yes Historical Provider, MD  nicotine (NICODERM CQ) 21 mg/24hr patch Place 1 patch (21 mg total) onto the skin daily. Patient not taking: Reported on 03/07/2015 10/30/14   Toy Cookey, MD   Physical Exam: Filed Vitals:   03/07/15 1055  BP: 105/56  Pulse: 88  Temp: 100.4 F (38 C)  Resp: 24     General: Well developed, well nourished, NAD, appears stated age  HEENT: NCAT, PERRLA, EOMI, Anicteic Sclera, mucous membranes dry.   Neck: Supple, no JVD, no masses  Cardiovascular: S1 S2 auscultated, no rubs, murmurs or gallops. Tachycardic  Respiratory: Diminished breath  sounds, no wheezing  Abdomen: Soft, nontender, nondistended, + bowel sounds  Extremities: warm dry without cyanosis clubbing or edema  Neuro: AAOx3, cranial nerves grossly intact. Strength 5/5 in patient's upper and lower extremities bilaterally  Skin: Without rashes exudates or nodules  Psych: Normal affect and demeanor with intact judgement and insight  Labs on Admission:  Basic Metabolic Panel:  Recent Labs Lab 03/07/15 0856  NA 132*  K 3.7  CL 98  CO2 26  GLUCOSE 117*  BUN 7  CREATININE 0.62  CALCIUM 8.5   Liver Function Tests: No results for input(s): AST, ALT, ALKPHOS, BILITOT, PROT, ALBUMIN in the last 168 hours. No results for input(s): LIPASE, AMYLASE in the last 168 hours. No results for input(s): AMMONIA in the last 168 hours. CBC:  Recent Labs Lab 03/07/15 0856  WBC 4.4  NEUTROABS 3.3  HGB 14.6  HCT 42.0  MCV 93.3  PLT 167   Cardiac Enzymes: No results for input(s): CKTOTAL, CKMB, CKMBINDEX, TROPONINI in the last 168 hours.  BNP (last 3 results) No results for input(s): BNP in the last 8760 hours.  ProBNP (last 3 results) No results for input(s): PROBNP in the last 8760 hours.  CBG: No results for input(s): GLUCAP in the last 168 hours.  Radiological Exams on Admission: Dg Chest 2 View  03/07/2015   CLINICAL DATA:  Fever cough and short of breath  EXAM: CHEST  2 VIEW  COMPARISON:  01/17/2006  FINDINGS: Patchy airspace disease right upper lobe anteriorly, worrisome for pneumonia. This was not present previously. There may also be some mild left perihilar infiltrate.  Lung volume normal.  Negative for heart failure or effusion  Thoracic scoliosis.  IMPRESSION: Bilateral airspace disease, concerning for pneumonia.  No effusion.   Electronically Signed   By: Marlan Palauharles  Clark M.D.   On: 03/07/2015 09:11    EKG: None  Assessment/Plan Sepsis secondary to community acquired pneumonia -Patient is febrile with tachycardia and tachypnea upon  admission. -Chest x-ray shows: Bilateral airspace disease concerning for pneumonia -Will test patient for influenza as she has had some flulike symptoms with achiness as well as fever; will start patient on Tamiflu empirically -Will place patient on ceftriaxone and azithromycin, antitussives, Tylenol for fever -Will obtain sputum culture and Gram stain, urine strep pneumonia and legionella antigens -Lactic acid 0.77  Acute hypoxic respiratory failure likely secondary to the above -Upon ambulating, patient's oxygen saturations fell to 88%, 91% on room air at rest -Will continue treatment plan as above -Place on supplemental oxygen to maintain saturations above 92%  Tobacco abuse -Will place patient on nicotine patch -Counseled on smoking cessation  Depression -Continue Zoloft, Seroquel, BuSpar  Insomnia -Continue Seroquel  Hyponatremia -Likely secondary to dehydration and insensible losses -Will place on IV fluids and continue to monitor CBC -Will check urine electrolytes as well as TSH  DVT prophylaxis: Lovenox  Code Status: Full  Condition: Guarded  Family Communication: None at bedside. Admission, patients condition and plan of care including tests being ordered have been discussed with the patient,  who indicates understanding and agrees with the plan and Code Status.  Disposition Plan: Admitted  Time spent: 60 minutes  Qualyn Oyervides D.O. [facility] Pager [phone]  If 7PM-7AM, please contact night-coverage [email or web address] Password [email or web address] 03/07/2015, 11:06 AM

## 2015-03-07 NOTE — ED Notes (Signed)
Pt reports productive cough that started on Friday, cough has since become unproductive. Pt has not slept in 24 hours. Pain with coughing in back. No wheezing or rhonchi noted. Pt reports symptoms are similar to when she had pneumonia.

## 2015-03-07 NOTE — ED Provider Notes (Signed)
CSN: [id number]     Arrival date & time 03/07/15  [id number] History   First MD Initiated Contact with Patient 03/07/15 [phone]     Chief Complaint  Patient presents with  . Cough     (Consider location/radiation/quality/duration/timing/severity/associated sxs/prior Treatment) HPI Comments: Patient presents to the ER for evaluation of cough, fever and chills. Symptoms began 4 days ago. She reports that she has a history of pneumonia and this feels similar. No vomiting, diarrhea. Patient reports that the cough has been productive of sputum and has been very persistent. She is having pain in the muscles in her back and around her rib cage from coughing.  Patient is a 59 y.o. female presenting with cough.  Cough Associated symptoms: chills, diaphoresis and fever     Past Medical History  Diagnosis Date  . Spondylolysis of cervical region   . Scoliosis   . Arthritis    Past Surgical History  Procedure Laterality Date  . Appendectomy    . Abdominal hysterectomy     History reviewed. No pertinent family history. History  Substance Use Topics  . Smoking status: Never Smoker   . Smokeless tobacco: Not on file  . Alcohol Use: No   OB History    No data available     Review of Systems  Constitutional: Positive for fever, chills and diaphoresis.  Respiratory: Positive for cough.   All other systems reviewed and are negative.     Allergies  Sulfa antibiotics  Home Medications   Prior to Admission medications   Medication Sig Start Date End Date Taking? Authorizing Provider  busPIRone (BUSPAR) 15 MG tablet Take 15 mg by mouth 2 (two) times daily.   Yes Historical Provider, MD  QUEtiapine (SEROQUEL XR) 200 MG 24 hr tablet Take 100 mg by mouth at bedtime.   Yes Historical Provider, MD  rOPINIRole (REQUIP) 1 MG tablet Take 1-2 mg by mouth daily as needed (restless leg syndrome.).   Yes Historical Provider, MD  sertraline (ZOLOFT) 100 MG tablet Take 150 mg by mouth daily.    Yes  Historical Provider, MD  amphetamine-dextroamphetamine (ADDERALL) 20 MG tablet Take 20 mg by mouth 3 (three) times daily.    Historical Provider, MD  clonazePAM (KLONOPIN) 0.5 MG tablet Take 0.5 mg by mouth 2 (two) times daily as needed for anxiety.    Historical Provider, MD  FLUoxetine (PROZAC) 40 MG capsule Take 40 mg by mouth daily.    Historical Provider, MD  nicotine (NICODERM CQ) 21 mg/24hr patch Place 1 patch (21 mg total) onto the skin daily. Patient not taking: Reported on 03/07/2015 10/30/14   Toy CookeyMegan Docherty, MD  predniSONE (DELTASONE) 10 MG tablet Take 4 tablets (40 mg total) by mouth daily. Patient not taking: Reported on 03/07/2015 03/24/12   Lorenz CoasterStephanie Wolfe, PA-C  QUEtiapine (SEROQUEL XR) 300 MG 24 hr tablet Take 300 mg by mouth at bedtime.    Historical Provider, MD  vitamin E (VITAMIN E) 1000 UNIT capsule Take 1,000 Units by mouth daily.    Historical Provider, MD   BP 172/97 mmHg  Pulse 111  Temp(Src) 99.9 F (37.7 C) (Oral)  Resp 24  Wt 145 lb 4 oz (65.885 kg)  SpO2 92% Physical Exam  Constitutional: She is oriented to person, place, and time. She appears well-developed and well-nourished. No distress.  HENT:  Head: Normocephalic and atraumatic.  Right Ear: Hearing normal.  Left Ear: Hearing normal.  Nose: Nose normal.  Mouth/Throat: Oropharynx is clear and moist and mucous  membranes are normal.  Eyes: Conjunctivae and EOM are normal. Pupils are equal, round, and reactive to light.  Neck: Normal range of motion. Neck supple.  Cardiovascular: Regular rhythm, S1 normal and S2 normal.  Exam reveals no gallop and no friction rub.   No murmur heard. Pulmonary/Chest: Effort normal. No respiratory distress. She has rales in the right lower field. She exhibits no tenderness.  Abdominal: Soft. Normal appearance and bowel sounds are normal. There is no hepatosplenomegaly. There is no tenderness. There is no rebound, no guarding, no tenderness at McBurney's point and negative  Murphy's sign. No hernia.  Musculoskeletal: Normal range of motion.  Neurological: She is alert and oriented to person, place, and time. She has normal strength. No cranial nerve deficit or sensory deficit. Coordination normal. GCS eye subscore is 4. GCS verbal subscore is 5. GCS motor subscore is 6.  Skin: Skin is warm, dry and intact. No rash noted. No cyanosis.  Psychiatric: She has a normal mood and affect. Her speech is normal and behavior is normal. Thought content normal.  Nursing note and vitals reviewed.   ED Course  Procedures (including critical care time) Labs Review Labs Reviewed  BASIC METABOLIC PANEL - Abnormal; Notable for the following:    Sodium 132 (*)    Glucose, Bld 117 (*)    All other components within normal limits  CULTURE, BLOOD (ROUTINE X 2)  CULTURE, BLOOD (ROUTINE X 2)  CBC WITH DIFFERENTIAL/PLATELET  I-STAT CG4 LACTIC ACID, ED    Imaging Review Dg Chest 2 View  03/07/2015   CLINICAL DATA:  Fever cough and short of breath  EXAM: CHEST  2 VIEW  COMPARISON:  01/17/2006  FINDINGS: Patchy airspace disease right upper lobe anteriorly, worrisome for pneumonia. This was not present previously. There may also be some mild left perihilar infiltrate.  Lung volume normal.  Negative for heart failure or effusion  Thoracic scoliosis.  IMPRESSION: Bilateral airspace disease, concerning for pneumonia.  No effusion.   Electronically Signed   By: Marlan Palau M.D.   On: 03/07/2015 09:11     EKG Interpretation None      MDM   Final diagnoses:  Cough  CAP (community acquired pneumonia)   Patient presents to the ER for evaluation of fever, chills, cough. Symptoms have been ongoing for 4 days. Cough is very harsh, has had significant proximal symptoms now resulting in pain in her ribs and back muscles. At arrival she is mildly hypoxic. Room air oxygen saturation is 91%. She was tachycardic, but febrile at arrival. She remains somewhat tachycardic after temperature has  improved, however. She does not appear to be septic at this time, however. Lactate is normal. White count is slightly elevated at 12. Chest x-ray confirms bilateral pneumonia. Patient was ambulated here in the ER and her oxygen saturations dropped to 80%. She does not have any known lung disease, is not on oxygen or inhalers at home. No bronchospasm here in the ER. Patient will require hospitalization for further management of bilateral kidney acquired pneumonia with hypoxia.    Gilda Crease, MD 03/07/15 1029

## 2015-03-08 LAB — BASIC METABOLIC PANEL
Anion gap: 4 — ABNORMAL LOW (ref 5–15)
BUN: 6 mg/dL (ref 6–23)
CALCIUM: 7.9 mg/dL — AB (ref 8.4–10.5)
CO2: 30 mmol/L (ref 19–32)
Chloride: 104 mmol/L (ref 96–112)
Creatinine, Ser: 0.65 mg/dL (ref 0.50–1.10)
GFR calc non Af Amer: >90 mL/min (ref >90)
Glucose, Bld: 98 mg/dL (ref 70–99)
POTASSIUM: 4.2 mmol/L (ref 3.5–5.1)
Sodium: 138 mmol/L (ref 135–145)

## 2015-03-08 LAB — CBC
HEMATOCRIT: 37.1 % (ref 36.0–46.0)
Hemoglobin: 12.4 g/dL (ref 12.0–15.0)
MCH: 31.9 pg (ref 26.0–34.0)
MCHC: 33.4 g/dL (ref 30.0–36.0)
MCV: 95.4 fL (ref 78.0–100.0)
Platelets: 153 10*3/uL (ref 150–400)
RBC: 3.89 MIL/uL (ref 3.87–5.11)
RDW: 12.8 % (ref 11.5–15.5)
WBC: 3.6 10*3/uL — AB (ref 4.0–10.5)

## 2015-03-08 LAB — LEGIONELLA ANTIGEN, URINE

## 2015-03-08 LAB — INFLUENZA PANEL BY PCR (TYPE A & B)
H1N1FLUPCR: DETECTED — AB
Influenza A By PCR: POSITIVE — AB
Influenza B By PCR: NEGATIVE

## 2015-03-08 LAB — HIV ANTIBODY (ROUTINE TESTING W REFLEX): HIV Screen 4th Generation wRfx: NONREACTIVE

## 2015-03-08 MED ORDER — ZOLPIDEM TARTRATE 5 MG PO TABS
5.0000 mg | ORAL_TABLET | Freq: Once | ORAL | Status: AC
  Administered 2015-03-08: 5 mg via ORAL
  Filled 2015-03-08: qty 1

## 2015-03-08 MED ORDER — ZOLPIDEM TARTRATE 5 MG PO TABS
5.0000 mg | ORAL_TABLET | Freq: Every evening | ORAL | Status: DC | PRN
  Administered 2015-03-08 – 2015-03-10 (×3): 5 mg via ORAL
  Filled 2015-03-08 (×3): qty 1

## 2015-03-08 NOTE — Progress Notes (Addendum)
Patient ID: Cynthia Best, female   DOB: December 27, 1955, 59 y.o.   MRN: [id number] [facility] PROGRESS NOTE  Cynthia Best VQM:[id number] DOB: 03-19-1956 DOA: 03/07/2015 PCP: No primary care provider on file.  Brief narrative:    59 y.o. female with past medical history of tobacco abuse, depression who presented to [facility] hospital with worsening shortness of breath, cough, fevers for past few days and worsening in last 24 hours prior to this admission. Patient reports having roommate who is sick and was just recently diagnosed with upper respiratory tract infection. Patient also reported several coworkers who had a flu. On admission, blood pressure was 95/76, heart rate 111, respiratory rate 24, T max 102.5 F and oxygen saturation 90% on room air. Blood work was unremarkable. Chest x-ray showed bilateral airspace disease concerning for pneumonia. She was started on azithromycin and Rocephin.  Assessment/Plan:    Principal Problem:   Sepsis due to pneumonia / community acquired pneumonia / acute respiratory failure with hypoxia - Sepsis criteria met on the admission with hypotension, tachycardia, tachypnea, fever and hypoxia. In addition evidence of pneumonia noted on chest x-ray. - Continue azithromycin and Rocephin. - Follow-up influenza panel. - Blood cultures to date are negative. Strep pneumonia is negative. Legionella is pending. - Respiratory status is stable. May continue duoneb 3 times daily scheduled and albuterol every 4 hours as needed for shortness of breath or wheezing  Active Problems:   Depression - Continue BuSpar, Seroquel and Zoloft    Tobacco abuse - Counseled on cessation.   DVT Prophylaxis  - Lovenox subcutaneous ordered   Code Status: Full.  Family Communication:  plan of care discussed with the patient Disposition Plan: Patient is on antibiotics for pneumonia. Possible discharge in the next 24 hours.  IV access:  Peripheral IV  Procedures and  diagnostic studies:    Dg Chest 2 View 03/07/2015  Bilateral airspace disease, concerning for pneumonia.  No effusion.   Electronically Signed   By: Franchot Gallo M.D.   On: 03/07/2015 09:11    Medical Consultants:  None  Other Consultants:  None  IAnti-Infectives:   Azithromycin 03/07/2015 --> Rocephin 03/07/2015 --> Tamiflu 03/07/2015 -->   Leisa Lenz, MD  [facility] Pager [phone]  If 7PM-7AM, please contact night-coverage [email or web address] Password [email or web address] 03/08/2015, 11:19 AM   LOS: 1 day    HPI/Subjective: No acute overnight events.  Objective: Filed Vitals:   03/07/15 1827 03/07/15 1922 03/07/15 2004 03/08/15 0508  BP:   115/63 122/57  Pulse:   84 84  Temp: 99.1 F (37.3 C)  99.5 F (37.5 C) 99.7 F (37.6 C)  TempSrc: Oral  Oral Oral  Resp:   16 16  Height:      Weight:      SpO2:  98% 97% 98%   No intake or output data in the 24 hours ending 03/08/15 1119  Exam:   General:  Pt is alert, follows commands appropriately, not in acute distress  Cardiovascular: Regular rate and rhythm, S1/S2, no murmurs  Respiratory: Clear to auscultation bilaterally, no wheezing, no crackles, no rhonchi  Abdomen: Soft, non tender, non distended, bowel sounds present  Extremities: No edema, pulses DP and PT palpable bilaterally  Neuro: Grossly nonfocal  Data Reviewed: Basic Metabolic Panel:  Recent Labs Lab 03/07/15 0856 03/07/15 1333 03/08/15 0705  NA 132*  --  138  K 3.7  --  4.2  CL 98  --  104  CO2 26  --  30  GLUCOSE 117*  --  98  BUN 7  --  6  CREATININE 0.62 0.61 0.65  CALCIUM 8.5  --  7.9*   Liver Function Tests: No results for input(s): AST, ALT, ALKPHOS, BILITOT, PROT, ALBUMIN in the last 168 hours. No results for input(s): LIPASE, AMYLASE in the last 168 hours. No results for input(s): AMMONIA in the last 168 hours. CBC:  Recent Labs Lab 03/07/15 0856 03/07/15 1539 03/08/15 0705  WBC 4.4 3.3* 3.6*  NEUTROABS 3.3  --   --    HGB 14.6 13.2 12.4  HCT 42.0 39.0 37.1  MCV 93.3 95.1 95.4  PLT 167 161 153   Cardiac Enzymes: No results for input(s): CKTOTAL, CKMB, CKMBINDEX, TROPONINI in the last 168 hours. BNP: Invalid input(s): POCBNP CBG: No results for input(s): GLUCAP in the last 168 hours.  Recent Results (from the past 240 hour(s))  Culture, blood (routine x 2)     Status: None (Preliminary result)   Collection Time: 03/07/15  9:43 AM  Result Value Ref Range Status   Specimen Description BLOOD LEFT HAND  Final   Special Requests BOTTLES DRAWN AEROBIC AND ANAEROBIC 5ML  Final   Culture   Final           BLOOD CULTURE RECEIVED NO GROWTH TO DATE    Report Status PENDING  Incomplete  Culture, blood (routine x 2)     Status: None (Preliminary result)   Collection Time: 03/07/15  9:47 AM  Result Value Ref Range Status   Specimen Description BLOOD RAC  Final   Special Requests BOTTLES DRAWN AEROBIC AND ANAEROBIC 5ML  Final   Culture   Final           BLOOD CULTURE RECEIVED NO GROWTH TO DATE    Report Status PENDING  Incomplete  Culture, sputum-assessment     Status: None   Collection Time: 03/07/15  4:39 PM  Result Value Ref Range Status   Specimen Description SPUTUM  Final   Special Requests NONE  Final   Sputum evaluation   Final    THIS SPECIMEN IS ACCEPTABLE. RESPIRATORY CULTURE REPORT TO FOLLOW.   Report Status 03/07/2015 FINAL  Final  Culture, respiratory (NON-Expectorated)     Status: None (Preliminary result)   Collection Time: 03/07/15  4:39 PM  Result Value Ref Range Status   Specimen Description SPUTUM  Final   Special Requests NONE  Final   Gram Stain   Final    ABUNDANT WBC PRESENT,BOTH PMN AND MONONUCLEAR FEW SQUAMOUS EPITHELIAL CELLS PRESENT NO ORGANISMS SEEN    Culture PENDING  Incomplete   Report Status PENDING  Incomplete     Scheduled Meds: . azithromycin  500 mg Intravenous Q24H  . busPIRone  15 mg Oral BID  . cefTRIAXone (ROCEPHIN)  IV  1 g Intravenous Q24H  .  dextromethorphan-guai  1 tablet Oral BID  . enoxaparin (LOVENOX) injection  40 mg Subcutaneous Q24H  . ipratropium-albuterol  3 mL Nebulization TID  . nicotine  21 mg Transdermal Daily  . oseltamivir  75 mg Oral BID  . QUEtiapine  100 mg Oral QHS  . sertraline  150 mg Oral Daily   Continuous Infusions: . sodium chloride 100 mL/hr at 03/07/15 1627

## 2015-03-09 DIAGNOSIS — J1100 Influenza due to unidentified influenza virus with unspecified type of pneumonia: Secondary | ICD-10-CM

## 2015-03-09 MED ORDER — HYDROMORPHONE HCL 1 MG/ML IJ SOLN
1.0000 mg | INTRAMUSCULAR | Status: DC | PRN
  Administered 2015-03-09 – 2015-03-12 (×16): 1 mg via INTRAVENOUS
  Filled 2015-03-09 (×17): qty 1

## 2015-03-09 MED ORDER — AZITHROMYCIN 500 MG PO TABS
500.0000 mg | ORAL_TABLET | Freq: Every day | ORAL | Status: DC
  Administered 2015-03-09 – 2015-03-12 (×4): 500 mg via ORAL
  Filled 2015-03-09 (×4): qty 1

## 2015-03-09 NOTE — Progress Notes (Signed)
PHARMACIST - PHYSICIAN COMMUNICATION DR:   Devine CONCERNING: Antibiotic IV to Oral Route Change Policy  RECOMMENDATION: This patient is receiving Azithromycin by the intravenous route.  Based on criteria approved by the Pharmacy and Therapeutics Committee, the antibiotic(s) is/are being converted to the equivalent oral dose form(s).   DESCRIPTION: These criteria include:  Patient being treated for a respiratory tract infection, urinary tract infection, cellulitis or clostridium difficile associated diarrhea if on metronidazole  The patient is not neutropenic and does not exhibit a GI malabsorption state  The patient is eating (either orally or via tube) and/or has been taking other orally administered medications for a least 24 hours  The patient is improving clinically and has a Tmax < 100.5  If you have questions about this conversion, please contact the Pharmacy Department  []  ( [phone] )  [place] []  ( [phone] )  [place]  []  ( [phone] )  [facility] [x]  ( [phone] )  [facility]   Mikell Camp, PharmD, BCPS   

## 2015-03-09 NOTE — Progress Notes (Addendum)
Patient ID: Cynthia Best, female   DOB: January 12, 1956, 59 y.o.   MRN: [id number] [facility] PROGRESS NOTE  JALYSSA FLEISHER JJO:[id number] DOB: August 14, 1956 DOA: 03/07/2015 PCP: No primary care provider on file.  Brief narrative:    59 y.o. female with past medical history of tobacco abuse, depression who presented to [facility] hospital with worsening shortness of breath, cough, fevers for past few days and worsening in last 24 hours prior to this admission. Patient reports having roommate who is sick and was just recently diagnosed with upper respiratory tract infection. Patient also reported several coworkers who had a flu. On admission, blood pressure was 95/76, heart rate 111, respiratory rate 24, T max 102.5 F and oxygen saturation 90% on room air. Blood work was unremarkable. Chest x-ray showed bilateral airspace disease concerning for pneumonia. She was started on azithromycin and Rocephin. Influenza panel is positive. She is on Tamiflu  Assessment/Plan:    Principal Problem:   Sepsis due to influenza pneumonia / community acquired pneumonia / acute respiratory failure with hypoxia - Sepsis criteria met on the admission with hypotension, tachycardia, tachypnea, fever and hypoxia. In addition evidence of pneumonia noted on chest x-ray. In addition she was found to have influenza pneumonia. - We will continue current antibiotics, azithromycin and Rocephin in addition to Tamiflu. - Blood cultures to date are negative. Strep pneumonia is negative. Legionella is pending. - Respiratory status remains stable.  Active Problems:   Depression - Continue BuSpar, Seroquel and Zoloft    Tobacco abuse - Counseled on cessation.   DVT Prophylaxis  - Lovenox subcutaneous ordered   Code Status: Full.  Family Communication:  plan of care discussed with the patient Disposition Plan: Patient feels weak this morning but will see if she feels better later on today. She may go home either today or  tomorrow.  IV access:  Peripheral IV  Procedures and diagnostic studies:    Dg Chest 2 View 03/07/2015  Bilateral airspace disease, concerning for pneumonia.  No effusion.   Electronically Signed   By: Franchot Gallo M.D.   On: 03/07/2015 09:11    Medical Consultants:  None  Other Consultants:  None  IAnti-Infectives:   Azithromycin 03/07/2015 --> Rocephin 03/07/2015 --> Tamiflu 03/07/2015 -->   Leisa Lenz, MD  [facility] Pager [phone]  If 7PM-7AM, please contact night-coverage [email or web address] Password [email or web address] 03/09/2015, 11:10 AM   LOS: 2 days    HPI/Subjective: No acute overnight events.  Objective: Filed Vitals:   03/08/15 1909 03/08/15 2018 03/09/15 0439 03/09/15 0957  BP:  147/54 127/62   Pulse:  101 82   Temp:  99.2 F (37.3 C) 99.4 F (37.4 C)   TempSrc:  Oral Oral   Resp:  16 16   Height:      Weight:      SpO2: 97% 97% 93% 93%    Intake/Output Summary (Last 24 hours) at 03/09/15 1110 Last data filed at 03/09/15 1039  Gross per 24 hour  Intake    360 ml  Output    500 ml  Net   -140 ml    Exam:   General:  Pt is not in acute distress  Cardiovascular: Rhythm regular, controlled rate, S1/S2, no murmurs  Respiratory: No wheezing, no crackles  Abdomen: Nontender, nondistended, pressure bowel sounds  Extremities: No lower extremity swelling, pulses palpable  Neuro: No focal deficits  Data Reviewed: Basic Metabolic Panel:  Recent Labs Lab 03/07/15 0856 03/07/15 1333 03/08/15 0705  NA 132*  --  138  K 3.7  --  4.2  CL 98  --  104  CO2 26  --  30  GLUCOSE 117*  --  98  BUN 7  --  6  CREATININE 0.62 0.61 0.65  CALCIUM 8.5  --  7.9*   Liver Function Tests: No results for input(s): AST, ALT, ALKPHOS, BILITOT, PROT, ALBUMIN in the last 168 hours. No results for input(s): LIPASE, AMYLASE in the last 168 hours. No results for input(s): AMMONIA in the last 168 hours. CBC:  Recent Labs Lab 03/07/15 0856 03/07/15 1539  03/08/15 0705  WBC 4.4 3.3* 3.6*  NEUTROABS 3.3  --   --   HGB 14.6 13.2 12.4  HCT 42.0 39.0 37.1  MCV 93.3 95.1 95.4  PLT 167 161 153   Cardiac Enzymes: No results for input(s): CKTOTAL, CKMB, CKMBINDEX, TROPONINI in the last 168 hours. BNP: Invalid input(s): POCBNP CBG: No results for input(s): GLUCAP in the last 168 hours.  Recent Results (from the past 240 hour(s))  Culture, blood (routine x 2)     Status: None (Preliminary result)   Collection Time: 03/07/15  9:43 AM  Result Value Ref Range Status   Specimen Description BLOOD LEFT HAND  Final   Special Requests BOTTLES DRAWN AEROBIC AND ANAEROBIC 5ML  Final   Culture   Final           BLOOD CULTURE RECEIVED NO GROWTH TO DATE    Report Status PENDING  Incomplete  Culture, blood (routine x 2)     Status: None (Preliminary result)   Collection Time: 03/07/15  9:47 AM  Result Value Ref Range Status   Specimen Description BLOOD RAC  Final   Special Requests BOTTLES DRAWN AEROBIC AND ANAEROBIC 5ML  Final   Culture   Final           BLOOD CULTURE RECEIVED NO GROWTH TO DATE    Report Status PENDING  Incomplete  Culture, sputum-assessment     Status: None   Collection Time: 03/07/15  4:39 PM  Result Value Ref Range Status   Specimen Description SPUTUM  Final   Special Requests NONE  Final   Sputum evaluation   Final    THIS SPECIMEN IS ACCEPTABLE. RESPIRATORY CULTURE REPORT TO FOLLOW.   Report Status 03/07/2015 FINAL  Final  Culture, respiratory (NON-Expectorated)     Status: None (Preliminary result)   Collection Time: 03/07/15  4:39 PM  Result Value Ref Range Status   Specimen Description SPUTUM  Final   Special Requests NONE  Final   Gram Stain   Final    ABUNDANT WBC PRESENT,BOTH PMN AND MONONUCLEAR FEW SQUAMOUS EPITHELIAL CELLS PRESENT NO ORGANISMS SEEN    Culture PENDING  Incomplete   Report Status PENDING  Incomplete     Scheduled Meds: . azithromycin  500 mg Intravenous Q24H  . busPIRone  15 mg Oral BID   . cefTRIAXone (ROCEPHIN)  IV  1 g Intravenous Q24H  . dextromethorphan-guai  1 tablet Oral BID  . enoxaparin (LOVENOX) injection  40 mg Subcutaneous Q24H  . ipratropium-albuterol  3 mL Nebulization TID  . nicotine  21 mg Transdermal Daily  . oseltamivir  75 mg Oral BID  . QUEtiapine  100 mg Oral QHS  . sertraline  150 mg Oral Daily   Continuous Infusions:

## 2015-03-10 LAB — CULTURE, RESPIRATORY

## 2015-03-10 LAB — CULTURE, RESPIRATORY W GRAM STAIN: Culture: NORMAL

## 2015-03-10 NOTE — Care Management Note (Signed)
CARE MANAGEMENT NOTE 03/10/2015  Patient:  Delma OfficerLOWE,Roni L   Account Number:  [id number]  Date Initiated:  03/10/2015  Documentation initiated by:  Sandford CrazeLEMENTS,Kaulana Brindle  Subjective/Objective Assessment:   59 yo admitted with Sepsis due to pneumonia     Action/Plan:   From home with roomate   Anticipated DC Date:  03/12/2015   Anticipated DC Plan:  HOME/SELF CARE      DC Planning Services  CM consult      Choice offered to / List presented to:             Status of service:  In process, will continue to follow Medicare Important Message given?   (If response is "NO", the following Medicare IM given date fields will be blank) Date Medicare IM given:   Medicare IM given by:   Date Additional Medicare IM given:   Additional Medicare IM given by:    Discharge Disposition:    Per UR Regulation:  Reviewed for med. necessity/level of care/duration of stay  If discussed at Long Length of Stay Meetings, dates discussed:    Comments:  03/10/15 Sandford Crazeora Comfort Iversen RN,BSN,NCM [phone] Chart reviewed. Pt has no PCP and no insurance. [facility]  She was encouraged to make a f/u appointment there at DC. It was explained to pt that they have a pharmacy and could assist her with medications at DC. CM will continue to follow.

## 2015-03-10 NOTE — Progress Notes (Signed)
Pt's O2 sat was checked without oxygen and she sated at 89%. Will cont to monitor.

## 2015-03-10 NOTE — Progress Notes (Addendum)
Patient ID: Cynthia Best, female   DOB: 1956/07/01, 59 y.o.   MRN: [id number] [facility] PROGRESS NOTE  Cynthia Best [id number]:[id number] DOB: 09-17-1956 DOA: 03/07/2015 PCP: No primary care provider on file.  Brief narrative:    59 y.o. female with past medical history of tobacco abuse, depression who presented to [facility] with worsening shortness of breath, cough, fevers for past few days and worsening in last 24 hours prior to this admission. Patient reports having roommate who is sick and was just recently diagnosed with upper respiratory tract infection. Patient also reported several coworkers who had a flu. On admission, blood pressure was 95/76, heart rate 111, respiratory rate 24, T max 102.5 F and oxygen saturation 90% on room air. Blood work was unremarkable. Chest x-ray showed bilateral airspace disease concerning for pneumonia. She was started on azithromycin and Rocephin. Influenza panel is positive. She is on Tamiflu  Assessment/Plan:    Principal Problem:   Sepsis due to influenza pneumonia / community acquired pneumonia / acute respiratory failure with hypoxia - Sepsis criteria met on the admission with hypotension, tachycardia, tachypnea, fever and hypoxia. In addition evidence of pneumonia noted on chest x-ray. - Continue azithromycin and Rocephin in addition to Tamiflu. - Blood cultures so far negative. Strep pneumonia  negative. Legionella negative.  Active Problems:   Depression - Continue BuSpar, Seroquel and Zoloft    Tobacco abuse - Counseled on cessation.   DVT Prophylaxis  - Lovenox subcutaneous ordered   Code Status: Full.  Family Communication:  plan of care discussed with the patient Disposition Plan: Patient still feels weak, discharge likely in next 24 hours  IV access:  Peripheral IV  Procedures and diagnostic studies:    Dg Chest 2 View 03/07/2015  Bilateral airspace disease, concerning for pneumonia.  No effusion.   Electronically  Signed   By: Franchot Gallo M.D.   On: 03/07/2015 09:11    Medical Consultants:  None  Other Consultants:  None  IAnti-Infectives:   Azithromycin 03/07/2015 --> Rocephin 03/07/2015 --> Tamiflu 03/07/2015 -->   Leisa Lenz, MD  [facility] Pager [phone]  If 7PM-7AM, please contact night-coverage [email or web address] Password [email or web address] 03/10/2015, 1:02 PM   LOS: 3 days    HPI/Subjective: No acute overnight events.  Objective: Filed Vitals:   03/10/15 0845 03/10/15 0846 03/10/15 1146 03/10/15 1157  BP:      Pulse:      Temp:   100.1 F (37.8 C) 100.3 F (37.9 C)  TempSrc:   Oral Oral  Resp:      Height:      Weight:      SpO2: 93% 89%      Intake/Output Summary (Last 24 hours) at 03/10/15 1302 Last data filed at 03/10/15 0846  Gross per 24 hour  Intake    820 ml  Output    650 ml  Net    170 ml    Exam:   General:  Pt is alert, anxious  Cardiovascular: Rhythm regular, appreciate S1, S2  Respiratory: bilateral air entry, no crackles  Abdomen: (+) BS, non tender, non distended  Extremities: No edema, pulses palpable  Neuro: Non focal  Data Reviewed: Basic Metabolic Panel:  Recent Labs Lab 03/07/15 0856 03/07/15 1333 03/08/15 0705  NA 132*  --  138  K 3.7  --  4.2  CL 98  --  104  CO2 26  --  30  GLUCOSE 117*  --  98  BUN 7  --  6  CREATININE 0.62 0.61 0.65  CALCIUM 8.5  --  7.9*   Liver Function Tests: No results for input(s): AST, ALT, ALKPHOS, BILITOT, PROT, ALBUMIN in the last 168 hours. No results for input(s): LIPASE, AMYLASE in the last 168 hours. No results for input(s): AMMONIA in the last 168 hours. CBC:  Recent Labs Lab 03/07/15 0856 03/07/15 1539 03/08/15 0705  WBC 4.4 3.3* 3.6*  NEUTROABS 3.3  --   --   HGB 14.6 13.2 12.4  HCT 42.0 39.0 37.1  MCV 93.3 95.1 95.4  PLT 167 161 153   Cardiac Enzymes: No results for input(s): CKTOTAL, CKMB, CKMBINDEX, TROPONINI in the last 168 hours. BNP: Invalid input(s):  POCBNP CBG: No results for input(s): GLUCAP in the last 168 hours.  Recent Results (from the past 240 hour(s))  Culture, blood (routine x 2)     Status: None (Preliminary result)   Collection Time: 03/07/15  9:43 AM  Result Value Ref Range Status   Specimen Description BLOOD LEFT HAND  Final   Special Requests BOTTLES DRAWN AEROBIC AND ANAEROBIC 5ML  Final   Culture   Final           BLOOD CULTURE RECEIVED NO GROWTH TO DATE    Report Status PENDING  Incomplete  Culture, blood (routine x 2)     Status: None (Preliminary result)   Collection Time: 03/07/15  9:47 AM  Result Value Ref Range Status   Specimen Description BLOOD RAC  Final   Special Requests BOTTLES DRAWN AEROBIC AND ANAEROBIC 5ML  Final   Culture   Final           BLOOD CULTURE RECEIVED NO GROWTH TO DATE    Report Status PENDING  Incomplete  Culture, sputum-assessment     Status: None   Collection Time: 03/07/15  4:39 PM  Result Value Ref Range Status   Specimen Description SPUTUM  Final   Special Requests NONE  Final   Sputum evaluation   Final    THIS SPECIMEN IS ACCEPTABLE. RESPIRATORY CULTURE REPORT TO FOLLOW.   Report Status 03/07/2015 FINAL  Final  Culture, respiratory (NON-Expectorated)     Status: None (Preliminary result)   Collection Time: 03/07/15  4:39 PM  Result Value Ref Range Status   Specimen Description SPUTUM  Final   Special Requests NONE  Final   Gram Stain   Final    ABUNDANT WBC PRESENT,BOTH PMN AND MONONUCLEAR FEW SQUAMOUS EPITHELIAL CELLS PRESENT NO ORGANISMS SEEN    Culture PENDING  Incomplete   Report Status PENDING  Incomplete     Scheduled Meds: . azithromycin  500 mg Intravenous Q24H  . busPIRone  15 mg Oral BID  . cefTRIAXone (ROCEPHIN)  IV  1 g Intravenous Q24H  . dextromethorphan-guai  1 tablet Oral BID  . enoxaparin (LOVENOX) injection  40 mg Subcutaneous Q24H  . ipratropium-albuterol  3 mL Nebulization TID  . nicotine  21 mg Transdermal Daily  . oseltamivir  75 mg Oral  BID  . QUEtiapine  100 mg Oral QHS  . sertraline  150 mg Oral Daily   Continuous Infusions:

## 2015-03-11 DIAGNOSIS — J101 Influenza due to other identified influenza virus with other respiratory manifestations: Secondary | ICD-10-CM

## 2015-03-11 MED ORDER — ONDANSETRON HCL 4 MG/2ML IJ SOLN
4.0000 mg | Freq: Four times a day (QID) | INTRAMUSCULAR | Status: DC | PRN
  Administered 2015-03-11: 4 mg via INTRAVENOUS
  Filled 2015-03-11: qty 2

## 2015-03-11 NOTE — Progress Notes (Addendum)
Patient ID: Cynthia Best, female   DOB: 1956/12/10, 59 y.o.   MRN: [id number] [facility] PROGRESS NOTE  Cynthia Best [id number]:[id number] DOB: Jun 10, 1956 DOA: 03/07/2015 PCP: No primary care provider on file.  Brief narrative:    59 y.o. female with past medical history of tobacco abuse, depression who presented to [facility] hospital with worsening shortness of breath, cough, fevers for past few days and worsening in last 24 hours prior to this admission. Patient reports having roommate who is sick and was just recently diagnosed with upper respiratory tract infection. Patient also reported several coworkers who had a flu. On admission, blood pressure was 95/76, heart rate 111, respiratory rate 24, T max 102.5 F and oxygen saturation 90% on room air. Blood work was unremarkable. Chest x-ray showed bilateral airspace disease concerning for pneumonia. She was started on azithromycin and Rocephin. Influenza panel is positive. She is on Tamiflu.  Assessment/Plan:    Principal Problem:   Sepsis due to influenza pneumonia / community acquired pneumonia / acute respiratory failure with hypoxia - Sepsis criteria met on the admission with hypotension, tachycardia, tachypnea, fever and hypoxia. In addition evidence of pneumonia noted on chest x-ray and positive H1N1. - Patient has spiked fever overnight, T max 101.4 F - Continue current antibiotics: Azithromycin and Rocephin in addition to Tamiflu. - Blood cultures so far negative. Strep pneumonia  negative. Legionella negative.  Active Problems:   Depression - Continue BuSpar, Seroquel and Zoloft - Stable     Tobacco abuse - Counseled on cessation.   DVT Prophylaxis  - Lovenox subcutaneous ordered while pt is in hospital    Code Status: Full.  Family Communication:  plan of care discussed with the patient Disposition Plan: Fever overnight so will observe for next 24 hours.   IV access:  Peripheral IV  Procedures and diagnostic studies:     Dg Chest 2 View 03/07/2015  Bilateral airspace disease, concerning for pneumonia.  No effusion.      Medical Consultants:  None  Other Consultants:  None  IAnti-Infectives:   Azithromycin 03/07/2015 --> Rocephin 03/07/2015 --> Tamiflu 03/07/2015 -->   Leisa Lenz, MD  [facility] Pager [phone]  If 7PM-7AM, please contact night-coverage [email or web address] Password [facility] 03/11/2015, 12:18 PM   LOS: 4 days    HPI/Subjective: No acute overnight events.  Objective: Filed Vitals:   03/10/15 2142 03/10/15 2223 03/11/15 0155 03/11/15 0435  BP:  136/90  125/70  Pulse:  82  84  Temp: 98.8 F (37.1 C) 97.6 F (36.4 C) 98.5 F (36.9 C) 98.8 F (37.1 C)  TempSrc: Oral Oral Oral Oral  Resp:  20  20  Height:      Weight:      SpO2:  97%  97%    Intake/Output Summary (Last 24 hours) at 03/11/15 1218 Last data filed at 03/11/15 0554  Gross per 24 hour  Intake    600 ml  Output   1400 ml  Net   -800 ml    Exam:   General:  Pt is awake, alert, no distress  Cardiovascular: RRR, appreciate S1,S2  Respiratory: mild wheezing in upper lung lobes, no crackles   Abdomen: no tenderness or distention, appreciate BS  Extremities: No LE swelling, palpable pulses   Neuro: No focal neuro deficits   Data Reviewed: Basic Metabolic Panel:  Recent Labs Lab 03/07/15 0856 03/07/15 1333 03/08/15 0705  NA 132*  --  138  K 3.7  --  4.2  CL 98  --  104  CO2 26  --  30  GLUCOSE 117*  --  98  BUN 7  --  6  CREATININE 0.62 0.61 0.65  CALCIUM 8.5  --  7.9*   Liver Function Tests: No results for input(s): AST, ALT, ALKPHOS, BILITOT, PROT, ALBUMIN in the last 168 hours. No results for input(s): LIPASE, AMYLASE in the last 168 hours. No results for input(s): AMMONIA in the last 168 hours. CBC:  Recent Labs Lab 03/07/15 0856 03/07/15 1539 03/08/15 0705  WBC 4.4 3.3* 3.6*  NEUTROABS 3.3  --   --   HGB 14.6 13.2 12.4  HCT 42.0 39.0 37.1  MCV 93.3 95.1 95.4  PLT  167 161 153   Cardiac Enzymes: No results for input(s): CKTOTAL, CKMB, CKMBINDEX, TROPONINI in the last 168 hours. BNP: Invalid input(s): POCBNP CBG: No results for input(s): GLUCAP in the last 168 hours.  Culture, blood (routine x 2)     Status: None (Preliminary result)   Collection Time: 03/07/15  9:43 AM  Result Value Ref Range Status   Specimen Description BLOOD LEFT HAND  Final   Culture   Final           BLOOD CULTURE RECEIVED NO GROWTH TO DATE    Report Status PENDING  Incomplete  Culture, blood (routine x 2)     Status: None (Preliminary result)   Collection Time: 03/07/15  9:47 AM  Result Value Ref Range Status   Specimen Description BLOOD RAC  Final   Culture   Final           BLOOD CULTURE RECEIVED NO GROWTH TO DATE    Report Status PENDING  Incomplete  Culture, sputum-assessment     Status: None   Collection Time: 03/07/15  4:39 PM  Result Value Ref Range Status   Specimen Description SPUTUM  Final   Special Requests NONE  Final   Sputum evaluation   Final    THIS SPECIMEN IS ACCEPTABLE. RESPIRATORY CULTURE REPORT TO FOLLOW.   Report Status 03/07/2015 FINAL  Final  Culture, respiratory (NON-Expectorated)     Status: None (Preliminary result)   Collection Time: 03/07/15  4:39 PM  Result Value Ref Range Status   Specimen Description SPUTUM  Final   Special Requests NONE  Final   Gram Stain   Final    NO ORGANISMS SEEN     Scheduled Meds: . azithromycin  500 mg Intravenous Q24H  . busPIRone  15 mg Oral BID  . cefTRIAXone (ROCEPHIN)  IV  1 g Intravenous Q24H  . dextromethorphan-guai  1 tablet Oral BID  . enoxaparin (LOVENOX) injection  40 mg Subcutaneous Q24H  . ipratropium-albuterol  3 mL Nebulization TID  . nicotine  21 mg Transdermal Daily  . oseltamivir  75 mg Oral BID  . QUEtiapine  100 mg Oral QHS  . sertraline  150 mg Oral Daily   Continuous Infusions:

## 2015-03-12 ENCOUNTER — Emergency Department (HOSPITAL_COMMUNITY)
Admission: EM | Admit: 2015-03-12 | Discharge: 2015-03-13 | Disposition: A | Discharge status: Home / Self Care | Payer: Self-pay | Attending: Emergency Medicine | Admitting: Emergency Medicine

## 2015-03-12 ENCOUNTER — Encounter (HOSPITAL_COMMUNITY): Payer: Self-pay | Admitting: Oncology

## 2015-03-12 DIAGNOSIS — J189 Pneumonia, unspecified organism: Secondary | ICD-10-CM

## 2015-03-12 DIAGNOSIS — Z87891 Personal history of nicotine dependence: Secondary | ICD-10-CM | POA: Insufficient documentation

## 2015-03-12 DIAGNOSIS — R0902 Hypoxemia: Secondary | ICD-10-CM | POA: Insufficient documentation

## 2015-03-12 DIAGNOSIS — J159 Unspecified bacterial pneumonia: Secondary | ICD-10-CM | POA: Insufficient documentation

## 2015-03-12 DIAGNOSIS — Z79899 Other long term (current) drug therapy: Secondary | ICD-10-CM | POA: Insufficient documentation

## 2015-03-12 DIAGNOSIS — Z8739 Personal history of other diseases of the musculoskeletal system and connective tissue: Secondary | ICD-10-CM | POA: Insufficient documentation

## 2015-03-12 LAB — CBC
HCT: 32.2 % — ABNORMAL LOW (ref 36.0–46.0)
Hemoglobin: 10.9 g/dL — ABNORMAL LOW (ref 12.0–15.0)
MCH: 31.7 pg (ref 26.0–34.0)
MCHC: 33.9 g/dL (ref 30.0–36.0)
MCV: 93.6 fL (ref 78.0–100.0)
Platelets: 476 10*3/uL — ABNORMAL HIGH (ref 150–400)
RBC: 3.44 MIL/uL — ABNORMAL LOW (ref 3.87–5.11)
RDW: 12.8 % (ref 11.5–15.5)
WBC: 8.4 10*3/uL (ref 4.0–10.5)

## 2015-03-12 MED ORDER — LEVOFLOXACIN 750 MG PO TABS: 750.0000 mg | ORAL_TABLET | Freq: Every day | ORAL | Status: DC

## 2015-03-12 MED ORDER — OXYCODONE-ACETAMINOPHEN 5-325 MG PO TABS: 1.0000 | ORAL_TABLET | Freq: Three times a day (TID) | ORAL | Status: DC | PRN

## 2015-03-12 MED ORDER — DM-GUAIFENESIN ER 30-600 MG PO TB12: 1.0000 | ORAL_TABLET | Freq: Two times a day (BID) | ORAL | Status: DC | PRN

## 2015-03-12 NOTE — ED Notes (Signed)
Per EMS pt reported being dx w/ H1N1 and PNE.  Pt is on home O2 at this time.  Upon arrival of EMS pt was 84% on RA.  Pt placed on 4L o2 w/ an improvement of sats to 96%.  Pt reported to EMS that she is almost out of home O2.

## 2015-03-12 NOTE — Progress Notes (Signed)
Patient discharged home, all discharge medications and instructions reviewed and questions answered. Patient to be assisted to vehicle by wheelchair.  

## 2015-03-12 NOTE — Discharge Instructions (Signed)

## 2015-03-12 NOTE — Progress Notes (Addendum)
Room air sats 80% at rest.

## 2015-03-12 NOTE — ED Notes (Signed)
Bed: [id number] Expected date: 03/12/15 Expected time: 10:41 PM Means of arrival: Ambulance Comments: Short of breath

## 2015-03-12 NOTE — Care Management Note (Addendum)
CARE MANAGEMENT NOTE 03/12/2015  Patient:  Cynthia Best,Cynthia Best   Account Number:  [id number]  Date Initiated:  03/10/2015  Documentation initiated by:  Sandford CrazeLEMENTS,NORA  Subjective/Objective Assessment:   59 yo admitted with Sepsis due to pneumonia     Action/Plan:   From home with roomate   Anticipated DC Date:  03/12/2015   Anticipated DC Plan:  HOME/SELF CARE      DC Planning Services  CM consult  MATCH Program      Choice offered to / List presented to:             Status of service:  Completed, signed off Medicare Important Message given?   (If response is "NO", the following Medicare IM given date fields will be blank) Date Medicare IM given:   Medicare IM given by:   Date Additional Medicare IM given:   Additional Medicare IM given by:    Discharge Disposition:    Per UR Regulation:  Reviewed for med. necessity/level of care/duration of stay  If discussed at Long Length of Stay Meetings, dates discussed:    Comments:  03/12/15 1615 [facility]. Rubie MaidCrystal Fraidy Mccarrick RN BSN CCM [phone]  03/12/15 1315 - CM spoke with patient at the bedside. Provided with MATCH letter. [facility]. Rubie MaidCrystal Anyi Fels RN BSN CCM [phone]  03/10/15 Sandford Crazeora Clements RN,BSN,NCM [phone] Chart reviewed. Pt has no PCP and no insurance. [facility]  She was encouraged to make a f/u appointment there at DC. It was explained to pt that they have a pharmacy and could assist her with medications at DC. CM will continue to follow.

## 2015-03-12 NOTE — Discharge Summary (Signed)
Physician Discharge Summary  Cynthia Best PVX:[id number] DOB: 1956/10/10 DOA: 03/07/2015  PCP: No primary care provider on file.  Admit date: 03/07/2015 Discharge date: 03/12/2015  Recommendations for Outpatient Follow-up:  1. Take Levaquin for 5 days on discharge 2. Already completed tamiflu in hospital so not needed at the time of the discharge   Discharge Diagnoses:  Principal Problem:   Sepsis due to pneumonia Active Problems:   CAP (community acquired pneumonia)   Acute respiratory failure with hypoxia   Tobacco abuse   Depression   Insomnia    Discharge Condition: stable   Diet recommendation: as tolerated   History of present illness:  59 y.o. female with past medical history of tobacco abuse, depression who presented to [facility] hospital with worsening shortness of breath, cough, fevers for past few days and worsening in last 24 hours prior to this admission. Patient reports having roommate who is sick and was just recently diagnosed with upper respiratory tract infection. Patient also reported several coworkers who had a flu. On admission, blood pressure was 95/76, heart rate 111, respiratory rate 24, T max 102.5 F and oxygen saturation 90% on room air. Blood work was unremarkable. Chest x-ray showed bilateral airspace disease concerning for pneumonia. She was started on azithromycin and Rocephin. Influenza panel was positive. She completed Tamiflu.  Assessment/Plan:    Principal Problem:  Sepsis due to influenza pneumonia / community acquired pneumonia / acute respiratory failure with hypoxia - Sepsis criteria met on the admission with hypotension, tachycardia, tachypnea, fever and hypoxia. In addition evidence of pneumonia noted on chest x-ray and positive H1N1. - She was started on broad spectrum abx, azithromycin and rocephin. In addition, she was started on Tamiflu for H1N1. She completed tamiflu. She is discharged with Levaquin for 5 days on discharge. - Blood  cultures so far negative. Strep pneumonia negative. Legionella negative.  Active Problems:  Depression - Continue BuSpar, Seroquel and Zoloft - Stable    Tobacco abuse - Counseled on cessation.   DVT Prophylaxis  - Lovenox subcutaneous ordered while pt is in hospital    Code Status: Full.  Family Communication: plan of care discussed with the patient   IV access:  Peripheral IV  Procedures and diagnostic studies:   Dg Chest 2 View 03/07/2015 Bilateral airspace disease, concerning for pneumonia. No effusion.    Medical Consultants:  None  Other Consultants:  None  IAnti-Infectives:   Azithromycin 03/07/2015 --> 03/12/2015 Rocephin 03/07/2015 --> 03/12/2015 Tamiflu 03/07/2015 --> 03/12/2015     Signed:  Leisa Lenz, MD  [facility] 03/12/2015, 9:57 AM  Pager #: [phone]   Discharge Exam: Filed Vitals:   03/12/15 0552  BP: 140/67  Pulse: 85  Temp: 98.5 F (36.9 C)  Resp: 20   Filed Vitals:   03/11/15 1403 03/11/15 1934 03/11/15 2007 03/12/15 0552  BP: 124/69  128/60 140/67  Pulse: 92  92 85  Temp: 99.6 F (37.6 C)  99.4 F (37.4 C) 98.5 F (36.9 C)  TempSrc: Oral  Oral Oral  Resp: 20  20 20   Height:      Weight:      SpO2: 93% 93% 93% 91%    General: Pt is alert, follows commands appropriately, not in acute distress Cardiovascular: Regular rate and rhythm, S1/S2 +, no murmurs Respiratory: Clear to auscultation bilaterally, no wheezing, no crackles, no rhonchi Abdominal: Soft, non tender, non distended, bowel sounds +, no guarding Extremities: no edema, no cyanosis, pulses palpable bilaterally DP and PT Neuro:  Grossly nonfocal  Discharge Instructions  Discharge Instructions    Call MD for:  difficulty breathing, headache or visual disturbances    Complete by:  As directed      Call MD for:  persistant nausea and vomiting    Complete by:  As directed      Call MD for:  severe uncontrolled pain     Complete by:  As directed      Diet - low sodium heart healthy    Complete by:  As directed      Discharge instructions    Complete by:  As directed   Take Levaquin for 5 days on discharge.     Increase activity slowly    Complete by:  As directed             Medication List    STOP taking these medications        nicotine 21 mg/24hr patch  Commonly known as:  NICODERM CQ      TAKE these medications        busPIRone 15 MG tablet  Commonly known as:  BUSPAR  Take 15 mg by mouth 2 (two) times daily.     dextromethorphan-guaiFENesin 30-600 MG per 12 hr tablet  Commonly known as:  MUCINEX DM  Take 1 tablet by mouth 2 (two) times daily as needed for cough.     levofloxacin 750 MG tablet  Commonly known as:  LEVAQUIN  Take 1 tablet (750 mg total) by mouth daily.     oxyCODONE-acetaminophen 5-325 MG per tablet  Commonly known as:  PERCOCET  Take 1 tablet by mouth every 8 (eight) hours as needed for severe pain.     QUEtiapine 200 MG 24 hr tablet  Commonly known as:  SEROQUEL XR  Take 100 mg by mouth at bedtime.     rOPINIRole 1 MG tablet  Commonly known as:  REQUIP  Take 1-2 mg by mouth daily as needed (restless leg syndrome.).     sertraline 100 MG tablet  Commonly known as:  ZOLOFT  Take 150 mg by mouth daily.           Follow-up Information    Follow up with [facility]    . Schedule an appointment as soon as possible for a visit in 1 week.   Why:  Follow up appt after recent hospitalization   Contact information:   [address] [place] [address] [phone]       The results of significant diagnostics from this hospitalization (including imaging, microbiology, ancillary and laboratory) are listed below for reference.    Significant Diagnostic Studies: Dg Chest 2 View  03/07/2015   CLINICAL DATA:  Fever cough and short of breath  EXAM: CHEST  2 VIEW  COMPARISON:  01/17/2006  FINDINGS: Patchy  airspace disease right upper lobe anteriorly, worrisome for pneumonia. This was not present previously. There may also be some mild left perihilar infiltrate.  Lung volume normal.  Negative for heart failure or effusion  Thoracic scoliosis.  IMPRESSION: Bilateral airspace disease, concerning for pneumonia.  No effusion.   Electronically Signed   By: Franchot Gallo M.D.   On: 03/07/2015 09:11    Microbiology: Culture, blood (routine x 2)     Status: None (Preliminary result)   Collection Time: 03/07/15  9:43 AM  Result Value Ref Range Status   Specimen Description BLOOD LEFT HAND  Final   Special Requests BOTTLES DRAWN AEROBIC  AND ANAEROBIC 5ML  Final   Culture   Final           BLOOD CULTURE RECEIVED NO GROWTH TO DATE   Report Status PENDING  Incomplete  Culture, blood (routine x 2)     Status: None (Preliminary result)   Collection Time: 03/07/15  9:47 AM  Result Value Ref Range Status   Specimen Description BLOOD RAC  Final   Special Requests BOTTLES DRAWN AEROBIC AND ANAEROBIC 5ML  Final   Culture   Final           BLOOD CULTURE RECEIVED NO GROWTH TO DATE    Report Status PENDING  Incomplete  Culture, sputum-assessment     Status: None   Collection Time: 03/07/15  4:39 PM  Result Value Ref Range Status   Specimen Description SPUTUM  Final   Special Requests NONE  Final   Sputum evaluation   Final    THIS SPECIMEN IS ACCEPTABLE. RESPIRATORY CULTURE REPORT TO FOLLOW.   Report Status 03/07/2015 FINAL  Final  Culture, respiratory (NON-Expectorated)     Status: None   Collection Time: 03/07/15  4:39 PM  Result Value Ref Range Status   Specimen Description SPUTUM  Final   Special Requests NONE  Final   Gram Stain   Final   Culture   Final    NORMAL OROPHARYNGEAL FLORA Performed at [facility]    Report Status 03/10/2015 FINAL  Final     Labs: Basic Metabolic Panel:  Recent Labs Lab 03/07/15 0856 03/07/15 1333 03/08/15 0705  NA 132*  --  138  K 3.7  --  4.2  CL  98  --  104  CO2 26  --  30  GLUCOSE 117*  --  98  BUN 7  --  6  CREATININE 0.62 0.61 0.65  CALCIUM 8.5  --  7.9*   Liver Function Tests: No results for input(s): AST, ALT, ALKPHOS, BILITOT, PROT, ALBUMIN in the last 168 hours. No results for input(s): LIPASE, AMYLASE in the last 168 hours. No results for input(s): AMMONIA in the last 168 hours. CBC:  Recent Labs Lab 03/07/15 0856 03/07/15 1539 03/08/15 0705  WBC 4.4 3.3* 3.6*  NEUTROABS 3.3  --   --   HGB 14.6 13.2 12.4  HCT 42.0 39.0 37.1  MCV 93.3 95.1 95.4  PLT 167 161 153   Cardiac Enzymes: No results for input(s): CKTOTAL, CKMB, CKMBINDEX, TROPONINI in the last 168 hours. BNP: BNP (last 3 results) No results for input(s): BNP in the last 8760 hours.  ProBNP (last 3 results) No results for input(s): PROBNP in the last 8760 hours.  CBG: No results for input(s): GLUCAP in the last 168 hours.  Time coordinating discharge: Over 30 minutes

## 2015-03-13 LAB — CULTURE, BLOOD (ROUTINE X 2)
CULTURE: NO GROWTH
Culture: NO GROWTH

## 2015-03-13 LAB — BASIC METABOLIC PANEL
Anion gap: 8 (ref 5–15)
BUN: 8 mg/dL (ref 6–23)
CO2: 29 mmol/L (ref 19–32)
Calcium: 8.1 mg/dL — ABNORMAL LOW (ref 8.4–10.5)
Chloride: 97 mmol/L (ref 96–112)
Creatinine, Ser: 0.6 mg/dL (ref 0.50–1.10)
GFR calc non Af Amer: >90 mL/min (ref >90)
GLUCOSE: 109 mg/dL — AB (ref 70–99)
Potassium: 3.3 mmol/L — ABNORMAL LOW (ref 3.5–5.1)
SODIUM: 134 mmol/L — AB (ref 135–145)

## 2015-03-13 MED ORDER — OXYCODONE-ACETAMINOPHEN 5-325 MG PO TABS
2.0000 | ORAL_TABLET | Freq: Once | ORAL | Status: AC
  Administered 2015-03-13: 2 via ORAL
  Filled 2015-03-13: qty 2

## 2015-03-13 NOTE — Discharge Instructions (Signed)
Use your oxygen as instructed.  Return to the emergency department for worsening condition or new concerning symptoms.  Follow-up as per your hospital discharge instructions from earlier today.    Pneumonia Pneumonia is an infection of the lungs.  CAUSES Pneumonia may be caused by bacteria or a virus. Usually, these infections are caused by breathing infectious particles into the lungs (respiratory tract). SIGNS AND SYMPTOMS   Cough.  Fever.  Chest pain.  Increased rate of breathing.  Wheezing.  Mucus production. DIAGNOSIS  If you have the common symptoms of pneumonia, your health care provider will typically confirm the diagnosis with a chest X-ray. The X-ray will show an abnormality in the lung (pulmonary infiltrate) if you have pneumonia. Other tests of your blood, urine, or sputum may be done to find the specific cause of your pneumonia. Your health care provider may also do tests (blood gases or pulse oximetry) to see how well your lungs are working. TREATMENT  Some forms of pneumonia may be spread to other people when you cough or sneeze. You may be asked to wear a mask before and during your exam. Pneumonia that is caused by bacteria is treated with antibiotic medicine. Pneumonia that is caused by the influenza virus may be treated with an antiviral medicine. Most other viral infections must run their course. These infections will not respond to antibiotics.  HOME CARE INSTRUCTIONS   Cough suppressants may be used if you are losing too much rest. However, coughing protects you by clearing your lungs. You should avoid using cough suppressants if you can.  Your health care provider may have prescribed medicine if he or she thinks your pneumonia is caused by bacteria or influenza. Finish your medicine even if you start to feel better.  Your health care provider may also prescribe an expectorant. This loosens the mucus to be coughed up.  Take medicines only as directed by your  health care provider.  Do not smoke. Smoking is a common cause of bronchitis and can contribute to pneumonia. If you are a smoker and continue to smoke, your cough may last several weeks after your pneumonia has cleared.  A cold steam vaporizer or humidifier in your room or home may help loosen mucus.  Coughing is often worse at night. Sleeping in a semi-upright position in a recliner or using a couple pillows under your head will help with this.  Get rest as you feel it is needed. Your body will usually let you know when you need to rest. PREVENTION A pneumococcal shot (vaccine) is available to prevent a common bacterial cause of pneumonia. This is usually suggested for:  People over 32 years old.  Patients on chemotherapy.  People with chronic lung problems, such as bronchitis or emphysema.  People with immune system problems. If you are over 65 or have a high risk condition, you may receive the pneumococcal vaccine if you have not received it before. In some countries, a routine influenza vaccine is also recommended. This vaccine can help prevent some cases of pneumonia.You may be offered the influenza vaccine as part of your care. If you smoke, it is time to quit. You may receive instructions on how to stop smoking. Your health care provider can provide medicines and counseling to help you quit. SEEK MEDICAL CARE IF: You have a fever. SEEK IMMEDIATE MEDICAL CARE IF:   Your illness becomes worse. This is especially true if you are elderly or weakened from any other disease.  You cannot control  your cough with suppressants and are losing sleep.  You begin coughing up blood.  You develop pain which is getting worse or is uncontrolled with medicines.  Any of the symptoms which initially brought you in for treatment are getting worse rather than better.  You develop shortness of breath or chest pain. MAKE SURE YOU:   Understand these instructions.  Will watch your  condition.  Will get help right away if you are not doing well or get worse. Document Released: 12/09/2005 Document Revised: 04/25/2014 Document Reviewed: 02/28/2011 [facility] Patient Information 2015 [employer] [place]. This information is not intended to replace advice given to you by your health care provider. Make sure you discuss any questions you have with your health care provider.

## 2015-03-13 NOTE — ED Provider Notes (Signed)
CSN: [id number]     Arrival date & time 03/12/15  2251 History   First MD Initiated Contact with Patient 03/13/15 [id number]     Chief Complaint  Patient presents with  . Shortness of Breath     (Consider location/radiation/quality/duration/timing/severity/associated sxs/prior Treatment) HPI 59 year old female discharged from the hospital earlier this evening with flu and pneumonia returns to the emergency department due to home oxygen running out.  Patient was discharged on 4 L, was given a two-hour tank.  She reports she arrived home around 8:00.  [facility] and was told someone would come out and supply her with additional oxygen.  When patient was nearing the end of her oxygen tank and still had not received a call back.  She called 911.  Sats were 84% on room air.  When EMS arrived.  She is feeling much better after oxygen was given.  She reports she is back to her baseline at discharge Past Medical History  Diagnosis Date  . Spondylolysis of cervical region   . Scoliosis   . Arthritis    Past Surgical History  Procedure Laterality Date  . Appendectomy    . Abdominal hysterectomy     History reviewed. No pertinent family history. History  Substance Use Topics  . Smoking status: Former Games developer  . Smokeless tobacco: Never Used  . Alcohol Use: No   OB History    No data available     Review of Systems   See History of Present Illness; otherwise all other systems are reviewed and negative  Allergies  Sulfa antibiotics  Home Medications   Prior to Admission medications   Medication Sig Start Date End Date Taking? Authorizing Provider  busPIRone (BUSPAR) 15 MG tablet Take 15 mg by mouth 2 (two) times daily.   Yes Historical Provider, MD  dextromethorphan-guaiFENesin (MUCINEX DM) 30-600 MG per 12 hr tablet Take 1 tablet by mouth 2 (two) times daily as needed for cough. 03/12/15  Yes Alison Murray, MD  levofloxacin (LEVAQUIN) 750 MG tablet Take 1 tablet (750 mg  total) by mouth daily. 03/12/15  Yes Alison Murray, MD  QUEtiapine (SEROQUEL XR) 200 MG 24 hr tablet Take 100 mg by mouth at bedtime.   Yes Historical Provider, MD  rOPINIRole (REQUIP) 1 MG tablet Take 1-2 mg by mouth daily as needed (restless leg syndrome.).   Yes Historical Provider, MD  sertraline (ZOLOFT) 100 MG tablet Take 150 mg by mouth daily.    Yes Historical Provider, MD  oxyCODONE-acetaminophen (PERCOCET) 5-325 MG per tablet Take 1 tablet by mouth every 8 (eight) hours as needed for severe pain. 03/12/15   Alison Murray, MD   BP 144/65 mmHg  Pulse 79  Temp(Src) 99.9 F (37.7 C) (Oral)  Resp 20  SpO2 95% Physical Exam  Constitutional: She is oriented to person, place, and time. She appears well-developed and well-nourished.  Uncomfortable appearing  HENT:  Head: Normocephalic and atraumatic.  Nose: Nose normal.  Mouth/Throat: Oropharynx is clear and moist.  Eyes: Conjunctivae and EOM are normal. Pupils are equal, round, and reactive to light.  Neck: Normal range of motion. Neck supple. No JVD present. No tracheal deviation present. No thyromegaly present.  Cardiovascular: Normal rate, regular rhythm, normal heart sounds and intact distal pulses.  Exam reveals no gallop and no friction rub.   No murmur heard. Pulmonary/Chest: Effort normal and breath sounds normal. No stridor. No respiratory distress. She has no wheezes. She has no rales. She exhibits  no tenderness.  Rhonchi bilaterally  Abdominal: Soft. Bowel sounds are normal. She exhibits no distension and no mass. There is no tenderness. There is no rebound and no guarding.  Musculoskeletal: Normal range of motion. She exhibits no edema or tenderness.  Lymphadenopathy:    She has no cervical adenopathy.  Neurological: She is alert and oriented to person, place, and time. She displays normal reflexes. She exhibits normal muscle tone. Coordination normal.  Skin: Skin is warm and dry. No rash noted. No erythema. No pallor.   Psychiatric: She has a normal mood and affect. Her behavior is normal. Judgment and thought content normal.  Nursing note and vitals reviewed.   ED Course  Procedures (including critical care time) Labs Review Labs Reviewed  CBC - Abnormal; Notable for the following:    RBC 3.44 (*)    Hemoglobin 10.9 (*)    HCT 32.2 (*)    Platelets 476 (*)    All other components within normal limits  BASIC METABOLIC PANEL - Abnormal; Notable for the following:    Sodium 134 (*)    Potassium 3.3 (*)    Glucose, Bld 109 (*)    Calcium 8.1 (*)    All other components within normal limits    Imaging Review No results found.   EKG Interpretation   Date/Time:  Sunday March 12 2015 23:19:34 EDT Ventricular Rate:  86 PR Interval:  154 QRS Duration: 98 QT Interval:  394 QTC Calculation: 471 R Axis:   61 Text Interpretation:  Sinus rhythm Probable left atrial enlargement  Baseline wander in lead(s) V3 Confirmed by Sully Manzi  MD, Skylen Spiering ([id number]) on  03/13/2015 12:20:59 AM      MDM   Final diagnoses:  CAP (community acquired pneumonia)  Hypoxia    59 year old female discharged from the hospital earlier this evening with 2 hours of oxygen.  Upon arrival home.  [facility] and has not yet heard back.  She called 911 at 11 PM and she is getting more short of breath and was running out of her oxygen.  Patient reports that now she is back on oxygen.  She feels back to her baseline at discharge earlier this evening.  2:00 AM Have made second call to [facility].  Spoke with [employer].  She reports they are going down the list trying to find someone who can deliver oxygen to this patient.  Marisa Severinlga Merrel Crabbe, MD 03/13/15 [phone]

## 2015-03-28 ENCOUNTER — Encounter: Payer: Self-pay | Admitting: Internal Medicine

## 2015-03-28 ENCOUNTER — Ambulatory Visit: Payer: Self-pay | Attending: Internal Medicine | Admitting: Internal Medicine

## 2015-03-28 VITALS — BP 145/83 | HR 99 | Temp 98.0°F | Resp 16 | Wt 142.2 lb

## 2015-03-28 DIAGNOSIS — F329 Major depressive disorder, single episode, unspecified: Secondary | ICD-10-CM | POA: Insufficient documentation

## 2015-03-28 DIAGNOSIS — Z0000 Encounter for general adult medical examination without abnormal findings: Secondary | ICD-10-CM

## 2015-03-28 DIAGNOSIS — Z1211 Encounter for screening for malignant neoplasm of colon: Secondary | ICD-10-CM

## 2015-03-28 DIAGNOSIS — G2581 Restless legs syndrome: Secondary | ICD-10-CM

## 2015-03-28 DIAGNOSIS — D649 Anemia, unspecified: Secondary | ICD-10-CM

## 2015-03-28 DIAGNOSIS — F32A Depression, unspecified: Secondary | ICD-10-CM

## 2015-03-28 DIAGNOSIS — J189 Pneumonia, unspecified organism: Secondary | ICD-10-CM

## 2015-03-28 DIAGNOSIS — H6120 Impacted cerumen, unspecified ear: Secondary | ICD-10-CM

## 2015-03-28 DIAGNOSIS — IMO0001 Reserved for inherently not codable concepts without codable children: Secondary | ICD-10-CM

## 2015-03-28 DIAGNOSIS — R030 Elevated blood-pressure reading, without diagnosis of hypertension: Secondary | ICD-10-CM

## 2015-03-28 DIAGNOSIS — Z1231 Encounter for screening mammogram for malignant neoplasm of breast: Secondary | ICD-10-CM

## 2015-03-28 LAB — ANEMIA PANEL
%SAT: 36 % (ref 20–55)
ABS Retic: 55.9 10*3/uL (ref 19.0–186.0)
FOLATE: 5.4 ng/mL
Ferritin: 325 ng/mL — ABNORMAL HIGH (ref 10–291)
Iron: 130 ug/dL (ref 42–145)
RBC.: 4.66 MIL/uL (ref 3.87–5.11)
RETIC CT PCT: 1.2 % (ref 0.4–2.3)
TIBC: 358 ug/dL (ref 250–470)
UIBC: 228 ug/dL (ref 125–400)
Vitamin B-12: 457 pg/mL (ref 211–911)

## 2015-03-28 LAB — CBC WITH DIFFERENTIAL/PLATELET
Basophils Absolute: 0.1 10*3/uL (ref 0.0–0.1)
Basophils Relative: 1 % (ref 0–1)
EOS PCT: 1 % (ref 0–5)
Eosinophils Absolute: 0.1 10*3/uL (ref 0.0–0.7)
HCT: 43.8 % (ref 36.0–46.0)
Hemoglobin: 14.9 g/dL (ref 12.0–15.0)
LYMPHS PCT: 48 % — AB (ref 12–46)
Lymphs Abs: 3 10*3/uL (ref 0.7–4.0)
MCH: 32 pg (ref 26.0–34.0)
MCHC: 34 g/dL (ref 30.0–36.0)
MCV: 94 fL (ref 78.0–100.0)
MONO ABS: 0.7 10*3/uL (ref 0.1–1.0)
MONOS PCT: 11 % (ref 3–12)
MPV: 9.4 fL (ref 8.6–12.4)
NEUTROS ABS: 2.4 10*3/uL (ref 1.7–7.7)
NEUTROS PCT: 39 % — AB (ref 43–77)
Platelets: 414 10*3/uL — ABNORMAL HIGH (ref 150–400)
RBC: 4.66 MIL/uL (ref 3.87–5.11)
RDW: 14 % (ref 11.5–15.5)
WBC: 6.2 10*3/uL (ref 4.0–10.5)

## 2015-03-28 LAB — COMPLETE METABOLIC PANEL WITH GFR
ALK PHOS: 77 U/L (ref 39–117)
ALT: 26 U/L (ref 0–35)
AST: 22 U/L (ref 0–37)
Albumin: 4.4 g/dL (ref 3.5–5.2)
BUN: 15 mg/dL (ref 6–23)
CHLORIDE: 102 meq/L (ref 96–112)
CO2: 28 mEq/L (ref 19–32)
Calcium: 9.7 mg/dL (ref 8.4–10.5)
Creat: 0.75 mg/dL (ref 0.50–1.10)
GFR, Est African American: >89 mL/min
GFR, Est Non African American: 88 mL/min
GLUCOSE: 86 mg/dL (ref 70–99)
Potassium: 5.1 mEq/L (ref 3.5–5.3)
Sodium: 141 mEq/L (ref 135–145)
Total Bilirubin: 0.3 mg/dL (ref 0.2–1.2)
Total Protein: 7.3 g/dL (ref 6.0–8.3)

## 2015-03-28 LAB — TSH: TSH: 0.837 u[IU]/mL (ref 0.350–4.500)

## 2015-03-28 LAB — HEMOGLOBIN A1C
HEMOGLOBIN A1C: 5.8 % — AB (ref <5.7)
MEAN PLASMA GLUCOSE: 120 mg/dL — AB (ref <117)

## 2015-03-28 MED ORDER — CARBAMIDE PEROXIDE 6.5 % OT SOLN: 5.0000 [drp] | Freq: Two times a day (BID) | OTIC | Status: DC

## 2015-03-28 NOTE — Patient Instructions (Signed)
DASH Eating Plan °DASH stands for "Dietary Approaches to Stop Hypertension." The DASH eating plan is a healthy eating plan that has been shown to reduce high blood pressure (hypertension). Additional health benefits may include reducing the risk of type 2 diabetes mellitus, heart disease, and stroke. The DASH eating plan may also help with weight loss. °WHAT DO I NEED TO KNOW ABOUT THE DASH EATING PLAN? °For the DASH eating plan, you will follow these general guidelines: °· Choose foods with a percent daily value for sodium of less than 5% (as listed on the food label). °· Use salt-free seasonings or herbs instead of table salt or sea salt. °· Check with your health care provider or pharmacist before using salt substitutes. °· Eat lower-sodium products, often labeled as "lower sodium" or "no salt added." °· Eat fresh foods. °· Eat more vegetables, fruits, and low-fat dairy products. °· Choose whole grains. Look for the word "whole" as the first word in the ingredient list. °· Choose fish and skinless chicken or turkey more often than red meat. Limit fish, poultry, and meat to 6 oz (170 g) each day. °· Limit sweets, desserts, sugars, and sugary drinks. °· Choose heart-healthy fats. °· Limit cheese to 1 oz (28 g) per day. °· Eat more home-cooked food and less restaurant, buffet, and fast food. °· Limit fried foods. °· Cook foods using methods other than frying. °· Limit canned vegetables. If you do use them, rinse them well to decrease the sodium. °· When eating at a restaurant, ask that your food be prepared with less salt, or no salt if possible. °WHAT FOODS CAN I EAT? °Seek help from a dietitian for individual calorie needs. °Grains °Whole grain or whole wheat bread. Brown rice. Whole grain or whole wheat pasta. Quinoa, bulgur, and whole grain cereals. Low-sodium cereals. Corn or whole wheat flour tortillas. Whole grain cornbread. Whole grain crackers. Low-sodium crackers. °Vegetables °Fresh or frozen vegetables  (raw, steamed, roasted, or grilled). Low-sodium or reduced-sodium tomato and vegetable juices. Low-sodium or reduced-sodium tomato sauce and paste. Low-sodium or reduced-sodium canned vegetables.  °Fruits °All fresh, canned (in natural juice), or frozen fruits. °Meat and Other Protein Products °Ground beef (85% or leaner), grass-fed beef, or beef trimmed of fat. Skinless chicken or turkey. Ground chicken or turkey. Pork trimmed of fat. All fish and seafood. Eggs. Dried beans, peas, or lentils. Unsalted nuts and seeds. Unsalted canned beans. °Dairy °Low-fat dairy products, such as skim or 1% milk, 2% or reduced-fat cheeses, low-fat ricotta or cottage cheese, or plain low-fat yogurt. Low-sodium or reduced-sodium cheeses. °Fats and Oils °Tub margarines without trans fats. Light or reduced-fat mayonnaise and salad dressings (reduced sodium). Avocado. Safflower, olive, or canola oils. Natural peanut or almond butter. °Other °Unsalted popcorn and pretzels. °The items listed above may not be a complete list of recommended foods or beverages. Contact your dietitian for more options. °WHAT FOODS ARE NOT RECOMMENDED? °Grains °White bread. White pasta. White rice. Refined cornbread. Bagels and croissants. Crackers that contain trans fat. °Vegetables °Creamed or fried vegetables. Vegetables in a cheese sauce. Regular canned vegetables. Regular canned tomato sauce and paste. Regular tomato and vegetable juices. °Fruits °Dried fruits. Canned fruit in light or heavy syrup. Fruit juice. °Meat and Other Protein Products °Fatty cuts of meat. Ribs, chicken wings, bacon, sausage, bologna, salami, chitterlings, fatback, hot dogs, bratwurst, and packaged luncheon meats. Salted nuts and seeds. Canned beans with salt. °Dairy °Whole or 2% milk, cream, half-and-half, and cream cheese. Whole-fat or sweetened yogurt. Full-fat   cheeses or blue cheese. Nondairy creamers and whipped toppings. Processed cheese, cheese spreads, or cheese  curds. °Condiments °Onion and garlic salt, seasoned salt, table salt, and sea salt. Canned and packaged gravies. Worcestershire sauce. Tartar sauce. Barbecue sauce. Teriyaki sauce. Soy sauce, including reduced sodium. Steak sauce. Fish sauce. Oyster sauce. Cocktail sauce. Horseradish. Ketchup and mustard. Meat flavorings and tenderizers. Bouillon cubes. Hot sauce. Tabasco sauce. Marinades. Taco seasonings. Relishes. °Fats and Oils °Butter, stick margarine, lard, shortening, ghee, and bacon fat. Coconut, palm kernel, or palm oils. Regular salad dressings. °Other °Pickles and olives. Salted popcorn and pretzels. °The items listed above may not be a complete list of foods and beverages to avoid. Contact your dietitian for more information. °WHERE CAN I FIND MORE INFORMATION? °National Heart, Lung, and [employer] Institute: www.nhlbi.nih.gov/health/health-topics/topics/dash/ °Document Released: 11/28/2011 Document Revised: 04/25/2014 Document Reviewed: 10/13/2013 °ExitCare® Patient Information ©2015 [employer]. This information is not intended to replace advice given to you by your health care provider. Make sure you discuss any questions you have with your health care provider. ° °

## 2015-03-28 NOTE — Progress Notes (Signed)
Patient here for hospital follow up Was admitted with pneumonia  And flu Was in hospital for six days  Has oxygen at the home [facility] Patient follow up with [facility] for her psych medications

## 2015-03-28 NOTE — Progress Notes (Signed)
Patient Demographics  Cynthia CowmanRonda Ayala, is a 59 y.o. female  [id number]  [id number]  DOB - 05/18/1956  CC:  Chief Complaint  Patient presents with  . Hospitalization Follow-up       HPI: Cynthia Best is a 59 y.o. female here today to establish medical care and is requesting annual physical examination.Patient has history of depression, tobacco abuse, recently hospitalized with symptoms of worsening shortness of breath, EMR reviewed patient was hypotensive tachycardic febrile, chest x-ray showed bilateral air space disease concerning for pneumonia, she was treated with her Zithromax and Rocephin also her influenza panel came back positive and she was treated with Tamiflu, she symptomatically improved and was discharged on Levaquin which she has already completed, for depression she was continued with BuSpar Seroquel Zoloft and is [facility], she was also discharged on oxygen.currently patient denies any acute symptoms.patient is already finished a course of antibiotic. Patient has No headache, No chest pain, No abdominal pain - No Nausea, No new weakness tingling or numbness, No Cough - SOB.  Allergies  Allergen Reactions  . Sulfa Antibiotics Other (See Comments)    Doesn't remember    Past Medical History  Diagnosis Date  . Spondylolysis of cervical region   . Scoliosis   . Arthritis    Current Outpatient Prescriptions on File Prior to Visit  Medication Sig Dispense Refill  . busPIRone (BUSPAR) 15 MG tablet Take 15 mg by mouth 2 (two) times daily.    Marland Kitchen. dextromethorphan-guaiFENesin (MUCINEX DM) 30-600 MG per 12 hr tablet Take 1 tablet by mouth 2 (two) times daily as needed for cough. 45 tablet 0  . levofloxacin (LEVAQUIN) 750 MG tablet Take 1 tablet (750 mg total) by mouth daily. 5 tablet 0  . oxyCODONE-acetaminophen (PERCOCET) 5-325 MG per tablet Take 1 tablet by mouth every 8 (eight) hours as needed for severe pain. 21 tablet 0  . QUEtiapine (SEROQUEL XR) 200  MG 24 hr tablet Take 100 mg by mouth at bedtime.    Marland Kitchen. rOPINIRole (REQUIP) 1 MG tablet Take 1-2 mg by mouth daily as needed (restless leg syndrome.).    Marland Kitchen. sertraline (ZOLOFT) 100 MG tablet Take 150 mg by mouth daily.      No current facility-administered medications on file prior to visit.   Family History  Problem Relation Age of Onset  . Stroke Mother   . Heart disease Father   . Hyperlipidemia Brother   . Diabetes Maternal Grandmother    History   Social History  . Marital Status: Legally Separated    Spouse Name: N/A  . Number of Children: N/A  . Years of Education: N/A   Occupational History  . Not on file.   Social History Main Topics  . Smoking status: Former Smoker -- 15 years  . Smokeless tobacco: Never Used  . Alcohol Use: No  . Drug Use: No  . Sexual Activity: Not Currently   Other Topics Concern  . Not on file   Social History Narrative    Review of Systems: Constitutional: Negative for fever, chills, diaphoresis, activity change, appetite change and fatigue. HENT: Negative for ear pain, nosebleeds, congestion, facial swelling, rhinorrhea, neck pain, neck stiffness and ear discharge.  Eyes: Negative for pain, discharge, redness, itching and visual disturbance. Respiratory: Negative for cough, choking, chest tightness, shortness of breath, wheezing and stridor.  Cardiovascular: Negative for chest pain, palpitations and leg swelling. Gastrointestinal: Negative for abdominal distention. Genitourinary: Negative for dysuria, urgency, frequency, hematuria, flank  pain, decreased urine volume, difficulty urinating and dyspareunia.  Musculoskeletal: Negative for back pain, joint swelling, arthralgia and gait problem. Neurological: Negative for dizziness, tremors, seizures, syncope, facial asymmetry, speech difficulty, weakness, light-headedness, numbness and headaches.  Hematological: Negative for adenopathy. Does not bruise/bleed easily. Psychiatric/Behavioral:  Negative for hallucinations, behavioral problems, confusion, dysphoric mood, decreased concentration and agitation.    Objective:   Filed Vitals:   03/28/15 1411  BP: 145/83  Pulse: 99  Temp: 98 F (36.7 C)  Resp: 16    Physical Exam: Constitutional: Patient appears well-developed and well-nourished. No distress. HENT: Normocephalic, atraumatic, External right and left ear normal. Oropharynx is clear and moist.  Eyes: Conjunctivae and EOM are normal. PERRLA, no scleral icterus. Neck: Normal ROM. Neck supple. No JVD. No tracheal deviation. No thyromegaly. CVS: RRR, S1/S2 +, no murmurs, no gallops, no carotid bruit.  Pulmonary: Effort and breath sounds normal, no stridor, rhonchi, wheezes, rales.  Abdominal: Soft. BS +, no distension, tenderness, rebound or guarding.  Musculoskeletal: Normal range of motion. No edema and no tenderness.  Lymphadenopathy: No lymphadenopathy noted, cervical, inguinal or axillary Neuro: Alert. Normal reflexes, muscle tone coordination. No cranial nerve deficit. Skin: Skin is warm and dry. No rash noted. Not diaphoretic. No erythema. No pallor. Psychiatric: Normal mood and affect. Behavior, judgment, thought content normal.  Lab Results  Component Value Date   WBC 8.4 03/12/2015   HGB 10.9* 03/12/2015   HCT 32.2* 03/12/2015   MCV 93.6 03/12/2015   PLT 476* 03/12/2015   Lab Results  Component Value Date   CREATININE 0.60 03/12/2015   BUN 8 03/12/2015   NA 134* 03/12/2015   K 3.3* 03/12/2015   CL 97 03/12/2015   CO2 29 03/12/2015    No results found for: HGBA1C Lipid Panel  No results found for: CHOL, TRIG, HDL, CHOLHDL, VLDL, LDLCALC     Assessment and plan:   1. Annual physical exam Ordered baseline blood work  - COMPLETE METABOLIC PANEL WITH GFR - Hemoglobin A1c - CBC with Differential/Platelet - Vit D  25 hydroxy (rtn osteoporosis monitoring) - TSH  2. CAP (community acquired pneumonia) Will repeat chest x-ray - DG Chest 2  View; Future  3. Depression Following up with [facility]  4. Elevated BP Advised patient for DASH diet  5. Special screening for malignant neoplasms, colon  - Ambulatory referral to Gastroenterology  6. Encounter for screening mammogram for breast cancer  - MM DIGITAL SCREENING BILATERAL; Future  7. Restless leg syndrome Currently patient is taking Requip  8. Anemia, unspecified anemia type  - Anemia panel  9. Excessive ear wax, unspecified laterality  - carbamide peroxide (DEBROX) 6.5 % otic solution; Place 5 drops into both ears 2 (two) times daily.  Dispense: 15 mL; Refill: 1      Health Maintenance -Colonoscopy:referred to GI -Pap Smear:will be scheduled -Mammogram:ordered   Return in about 3 months (around 06/27/2015), or if symptoms worsen or fail to improve.    The patient was given clear instructions to go to ER or return to medical center if symptoms don't improve, worsen or new problems develop. The patient verbalized understanding. The patient was told to call to get lab results if they haven't heard anything in the next week.    This note has been created with Education officer, environmental. Any transcriptional errors are unintentional.   Doris Cheadle, MD

## 2015-03-29 ENCOUNTER — Ambulatory Visit (HOSPITAL_COMMUNITY)
Admission: RE | Admit: 2015-03-29 | Discharge: 2015-03-29 | Disposition: A | Discharge status: Home / Self Care | Payer: Self-pay | Source: Ambulatory Visit | Attending: Internal Medicine | Admitting: Internal Medicine

## 2015-03-29 DIAGNOSIS — M47894 Other spondylosis, thoracic region: Secondary | ICD-10-CM | POA: Insufficient documentation

## 2015-03-29 DIAGNOSIS — R918 Other nonspecific abnormal finding of lung field: Secondary | ICD-10-CM | POA: Insufficient documentation

## 2015-03-29 DIAGNOSIS — J439 Emphysema, unspecified: Secondary | ICD-10-CM | POA: Insufficient documentation

## 2015-03-29 DIAGNOSIS — M419 Scoliosis, unspecified: Secondary | ICD-10-CM | POA: Insufficient documentation

## 2015-03-29 DIAGNOSIS — J189 Pneumonia, unspecified organism: Secondary | ICD-10-CM

## 2015-03-29 LAB — VITAMIN D 25 HYDROXY (VIT D DEFICIENCY, FRACTURES): Vit D, 25-Hydroxy: 7 ng/mL — ABNORMAL LOW (ref 30–100)

## 2015-03-30 ENCOUNTER — Telehealth: Payer: Self-pay | Admitting: *Deleted

## 2015-03-30 DIAGNOSIS — R918 Other nonspecific abnormal finding of lung field: Secondary | ICD-10-CM

## 2015-03-31 ENCOUNTER — Telehealth: Payer: Self-pay

## 2015-03-31 MED ORDER — VITAMIN D (ERGOCALCIFEROL) 1.25 MG (50000 UNIT) PO CAPS: 50000.0000 [IU] | ORAL_CAPSULE | ORAL | Status: DC

## 2015-03-31 NOTE — Telephone Encounter (Signed)
Per PCP chest CT with contrast ordered to f/u on CXR which revealed nodular opacities in right mid lung. [facility]15 at 1000 with arrival time of 0945 HIPAA compliant message left on patient's home VM to return call  Mobile number with voice message of female. No message left at this number.

## 2015-03-31 NOTE — Telephone Encounter (Signed)
-----   Message from Doris Cheadleeepak Advani, MD sent at 03/29/2015 12:43 PM EDT ----- Blood work reviewed, noticed low vitamin D, call patient advise to start ergocalciferol 50,000 units once a week for the duration of  12 weeks.   noticed hemoglobin A1c of 5.8 %, patient has prediabetes, call and advise patient for low carbohydrate diet. Also let the patient know that her hemoglobin level as well as iron is in normal range.

## 2015-03-31 NOTE — Telephone Encounter (Signed)
Patient aware of date and time of Chest CT Also given info on walk-in financial counseling hours

## 2015-03-31 NOTE — Telephone Encounter (Signed)
Patient is aware of her lab results 

## 2015-04-04 ENCOUNTER — Ambulatory Visit: Payer: Self-pay | Attending: Family Medicine | Admitting: Family Medicine

## 2015-04-04 ENCOUNTER — Encounter: Payer: Self-pay | Admitting: Family Medicine

## 2015-04-04 VITALS — BP 127/83 | HR 99 | Temp 98.6°F | Resp 19 | Ht 69.0 in | Wt 141.0 lb

## 2015-04-04 DIAGNOSIS — Z780 Asymptomatic menopausal state: Secondary | ICD-10-CM | POA: Insufficient documentation

## 2015-04-04 DIAGNOSIS — Z113 Encounter for screening for infections with a predominantly sexual mode of transmission: Secondary | ICD-10-CM | POA: Insufficient documentation

## 2015-04-04 DIAGNOSIS — Z90710 Acquired absence of both cervix and uterus: Secondary | ICD-10-CM | POA: Insufficient documentation

## 2015-04-04 DIAGNOSIS — N766 Ulceration of vulva: Secondary | ICD-10-CM | POA: Insufficient documentation

## 2015-04-04 NOTE — Assessment & Plan Note (Signed)
Wet prep  GC/chlam HSV today

## 2015-04-04 NOTE — Progress Notes (Signed)
Pap smear  Hx HYST due to heavy bleeding in 2007

## 2015-04-04 NOTE — Patient Instructions (Signed)
Ms. Cynthia Best,  Normal pelvic exam today except for shallow ulcerations on R labia concerning for genital herpes  Plan: HSV blood test Wet prep and GC/chlamydia swab  I recommend gyn referral for hormone replacement therapy. I recommend lamisil for toenail fungus which I will defer to your primary   You will be called with results  F/u with Dr. Orpah CobbAdvani  Dr. Armen PickupFunches

## 2015-04-04 NOTE — Assessment & Plan Note (Signed)
A: shallow ulcers on R labia. I suspect HSV P: HSV blood test

## 2015-04-04 NOTE — Progress Notes (Signed)
   Subjective:    Patient ID: Cynthia Best, female    DOB: 01/07/1956, 59 y.o.   MRN: [id number] CC: pelvic exam only, requesting STD testing  PCP: Advani  HPI  1. Pelvic exam: requesting STD testing. Same sex partner x 4 years   2. Menopausal: requesting estradiol.  Surg Hx: s/p hysterectomy for menorrhagia  Review of Systems  Genitourinary: Negative for vaginal bleeding, vaginal discharge and vaginal pain.       Objective:   Physical Exam BP 127/83 mmHg  Pulse 99  Temp(Src) 98.6 F (37 C) (Oral)  Resp 19  Ht 5\' 9"  (1.753 m)  Wt 141 lb (63.957 kg)  BMI 20.81 kg/m2  SpO2 96% General appearance: alert, cooperative and no distress Abdomen: soft, non-tender; bowel sounds normal; no masses,  no organomegaly Pelvic: cervix normal in appearance, no adnexal masses or tenderness, no cervical motion tenderness, rectovaginal septum normal, uterus surgically absent, vagina normal without discharge and two small shallow ulcerations on R labia majora, non tender  Lymph nodes: negative inguinal adenopathy  Ext: yellow and discolored toenail R middle foot with fissuring       Assessment & Plan:

## 2015-04-06 ENCOUNTER — Ambulatory Visit (HOSPITAL_COMMUNITY)
Admission: RE | Admit: 2015-04-06 | Discharge: 2015-04-06 | Disposition: A | Discharge status: Home / Self Care | Payer: Self-pay | Source: Ambulatory Visit | Attending: Internal Medicine | Admitting: Internal Medicine

## 2015-04-06 ENCOUNTER — Telehealth: Payer: Self-pay

## 2015-04-06 DIAGNOSIS — J189 Pneumonia, unspecified organism: Secondary | ICD-10-CM | POA: Insufficient documentation

## 2015-04-06 DIAGNOSIS — R9389 Abnormal findings on diagnostic imaging of other specified body structures: Secondary | ICD-10-CM

## 2015-04-06 DIAGNOSIS — R918 Other nonspecific abnormal finding of lung field: Secondary | ICD-10-CM

## 2015-04-06 LAB — HSV(HERPES SMPLX)ABS-I+II(IGG+IGM)-BLD
HERPES SIMPLEX VRS I-IGM AB (EIA): 1.44 {index} — AB
HSV 1 GLYCOPROTEIN G AB, IGG: 9.27 IV — AB
HSV 2 Glycoprotein G Ab, IgG: 4.44 IV — ABNORMAL HIGH

## 2015-04-06 LAB — CERVICOVAGINAL ANCILLARY ONLY
Chlamydia: NEGATIVE
Neisseria Gonorrhea: NEGATIVE
Wet Prep (BD Affirm): NEGATIVE

## 2015-04-06 MED ORDER — IOHEXOL 300 MG/ML  SOLN
80.0000 mL | Freq: Once | INTRAMUSCULAR | Status: AC | PRN
  Administered 2015-04-06: 80 mL via INTRAVENOUS

## 2015-04-06 MED ORDER — ACYCLOVIR 400 MG PO TABS: 400.0000 mg | ORAL_TABLET | Freq: Three times a day (TID) | ORAL | Status: DC

## 2015-04-06 NOTE — Telephone Encounter (Signed)
Patient is aware of her CT scan Referral to pulmonology placed in epic

## 2015-04-06 NOTE — Telephone Encounter (Signed)
-----   Message from Deepak Advani, MD sent at 04/06/2015 12:05 PM EDT ----- Call and let the patient know that her CT scan reported abnormal nodular opacities, has some COPD findings patient has already been treated for pneumonia, would recommend evaluation by pulmonologist, put in the referral 

## 2015-04-06 NOTE — Telephone Encounter (Signed)
Patient is aware of her pap results [facility]

## 2015-04-06 NOTE — Telephone Encounter (Signed)
-----   Message from Doris Cheadleeepak Advani, MD sent at 04/06/2015 11:29 AM EDT ----- Call and let the patient know that her STD test is negative

## 2015-04-06 NOTE — Telephone Encounter (Signed)
-----   Message from Doris Cheadleeepak Advani, MD sent at 04/06/2015 12:05 PM EDT ----- Call and let the patient know that her CT scan reported abnormal nodular opacities, has some COPD findings patient has already been treated for pneumonia, would recommend evaluation by pulmonologist, put in the referral

## 2015-04-10 ENCOUNTER — Encounter (INDEPENDENT_AMBULATORY_CARE_PROVIDER_SITE_OTHER): Payer: Self-pay

## 2015-04-10 ENCOUNTER — Encounter: Payer: Self-pay | Admitting: Internal Medicine

## 2015-04-10 ENCOUNTER — Ambulatory Visit (INDEPENDENT_AMBULATORY_CARE_PROVIDER_SITE_OTHER): Payer: Self-pay | Admitting: Internal Medicine

## 2015-04-10 VITALS — BP 118/76 | HR 82 | Ht 69.0 in | Wt 142.4 lb

## 2015-04-10 DIAGNOSIS — R011 Cardiac murmur, unspecified: Secondary | ICD-10-CM

## 2015-04-10 DIAGNOSIS — R918 Other nonspecific abnormal finding of lung field: Secondary | ICD-10-CM

## 2015-04-10 DIAGNOSIS — Z720 Tobacco use: Secondary | ICD-10-CM

## 2015-04-10 DIAGNOSIS — J189 Pneumonia, unspecified organism: Secondary | ICD-10-CM

## 2015-04-10 DIAGNOSIS — I358 Other nonrheumatic aortic valve disorders: Secondary | ICD-10-CM

## 2015-04-10 NOTE — Patient Instructions (Signed)
Please don't smoke at all.  Order 2D Echo   Dx  Systolic murmur  Order future CT scan of chest non-contrast   In 2 months    Dx lung nodules

## 2015-04-10 NOTE — Progress Notes (Signed)
04/10/15- 89 yoF former smoker, retired Pensions consultantattorney, with hx CAP, acute respiratory failure with hypoxia. Quit smoking 03/07/2015. FOLLOW FOR:  Referred by Dr. Orpah CobbAdvani.  Patient has abnormal CT results. CBC 03/28/15- OK, no eosinophilia Hospitalized March 15-21 with a flu syndrome and pneumonia. Follow-up chest x-ray then CT scan reviewed with her, showing patchy bilateral infiltrates consistent with pneumonia. Infiltrates were peripheral so radiologist questioned possibility of eosinophilic pneumonia area nodular densities in both lower lobes, nonspecific with follow-up recommended. Mild emphysema and lower lobe bronchiectasis. She now feels a little weak but almost back to normal with minimal residual cough and scant white sputum.  CXR 03/29/15- IMPRESSION: 1. Nodular opacities in the right mid lung persist and raise suspicion for possible malignancy. Chest CT is recommended. 2. Thoracic scoliosis and spondylosis. 3. Emphysema. Electronically Signed  By: Gaylyn RongWalter Liebkemann M.D.  On: 03/29/2015 13:54 CT chest 04/06/15 IMPRESSION: Patchy areas of peripheral infiltrate bilaterally, most consistent with pneumonia. The peripheral nature of these opacities does raise question of possible eosinophilic pneumonitis. Appropriate laboratory assessment advised. Nodular opacities are noted in both lower lobes, a finding which warrants a followup noncontrast enhanced chest CT in approximately 8 weeks. Mild emphysematous change with lower lobe bronchiectasis bilaterally. No adenopathy. Hepatic steatosis. Electronically Signed  By: Bretta BangWilliam Woodruff III M.D.  Prior to Admission medications   Medication Sig Start Date End Date Taking? Authorizing Provider  acyclovir (ZOVIRAX) 400 MG tablet Take 1 tablet (400 mg total) by mouth 3 (three) times daily. 04/06/15  Yes Doris Cheadleeepak Advani, MD  clonazePAM (KLONOPIN) 0.5 MG tablet Take 0.5 mg by mouth at bedtime.   Yes Historical Provider, MD  QUEtiapine (SEROQUEL XR)  200 MG 24 hr tablet Take 100 mg by mouth at bedtime.   Yes Historical Provider, MD  rOPINIRole (REQUIP) 1 MG tablet Take 1-2 mg by mouth daily as needed (restless leg syndrome.).   Yes Historical Provider, MD  sertraline (ZOLOFT) 100 MG tablet Take 150 mg by mouth daily.    Yes Historical Provider, MD  Vitamin D, Ergocalciferol, (DRISDOL) 50000 UNITS CAPS capsule Take 1 capsule (50,000 Units total) by mouth every 7 (seven) days. 03/31/15  Yes Quentin Angstlugbemiga E Jegede, MD   Past Medical History  Diagnosis Date  . Spondylolysis of cervical region   . Scoliosis   . Arthritis    Past Surgical History  Procedure Laterality Date  . Appendectomy  01/14/2005  . Abdominal hysterectomy  01/14/2006  . Placement of breast implants    . Myoectomy    . Vaginal prolapse repair  04/14/2006   Family History  Problem Relation Age of Onset  . Stroke Mother   . Heart disease Father   . Hyperlipidemia Brother   . Diabetes Maternal Grandmother    History   Social History  . Marital Status: Legally Separated    Spouse Name: N/A  . Number of Children: N/A  . Years of Education: N/A   Occupational History  . Not on file.   Social History Main Topics  . Smoking status: Former Smoker -- 1.00 packs/day for 17 years    Types: Cigarettes  . Smokeless tobacco: Former NeurosurgeonUser    Quit date: 03/07/2015  . Alcohol Use: 0.0 oz/week    0 Standard drinks or equivalent per week     Comment: 2 glass wine daily  . Drug Use: No  . Sexual Activity: Not Currently   Other Topics Concern  . Not on file   Social History Narrative   ROS-see HPI  Negative unless "+" Constitutional:    weight loss, night sweats, fevers, chills, fatigue, lassitude. HEENT:    headaches, difficulty swallowing, tooth/dental problems, sore throat,       sneezing, itching, ear ache, +nasal congestion, post nasal drip, snoring CV:    chest pain, orthopnea, PND, swelling in lower extremities, anasarca,                                  dizziness,  palpitations Resp:   +shortness of breath with exertion or at rest.                productive cough,   non-productive cough, coughing up of blood.              change in color of mucus.  wheezing.   Skin:    rash or lesions. GI:  No-   heartburn, indigestion, abdominal pain, nausea, vomiting, diarrhea,                 change in bowel habits, loss of appetite GU: dysuria, change in color of urine, no urgency or frequency.   flank pain. MS:   joint pain, stiffness, decreased range of motion, back pain. Neuro-     nothing unusual Psych:  change in mood or affect.  +depression or +anxiety.   memory loss.  OBJ- Physical Exam General- Alert, Oriented, Affect-appropriate, Distress- none acute Skin- rash-none, lesions- none, excoriation- none Lymphadenopathy- none Head- atraumatic            Eyes- Gross vision intact, PERRLA, conjunctivae and secretions clear            Ears- Hearing, canals-normal            Nose- Clear, no-Septal dev, mucus, polyps, erosion, perforation             Throat- Mallampati II , mucosa clear , drainage- none, tonsils- atrophic Neck- flexible , trachea midline, no stridor , thyroid nl, carotid no bruit Chest - symmetrical excursion , unlabored           Heart/CV- RRR , +2/6 systolic AS  murmur , no gallop  , no rub, nl s1 s2                           - JVD- none , edema- none, stasis changes- none, varices- none           Lung- clear to P&A, wheeze- none, cough- none , dullness-none, rub- none           Chest wall-  Abd-  Br/ Gen/ Rectal- Not done, not indicated Extrem- cyanosis- none, clubbing, none, atrophy- none, strength- nl Neuro- grossly intact to observation    On: 04/06/2015 11:39

## 2015-04-11 DIAGNOSIS — R918 Other nonspecific abnormal finding of lung field: Secondary | ICD-10-CM | POA: Insufficient documentation

## 2015-04-11 DIAGNOSIS — R011 Cardiac murmur, unspecified: Secondary | ICD-10-CM | POA: Insufficient documentation

## 2015-04-11 NOTE — Assessment & Plan Note (Signed)
She asks about E cigarettes and is strongly encouraged not to smoke anything.

## 2015-04-11 NOTE — Assessment & Plan Note (Signed)
Noted for tracking. Plan-schedule echocardiogram

## 2015-04-11 NOTE — Assessment & Plan Note (Signed)
Clinically this was consistent with a flu syndrome and viral pneumonia. Question if the abnormalities on imaging are residual from this or a separate process.

## 2015-04-11 NOTE — Assessment & Plan Note (Signed)
There are peripheral densities that might be residual from recent pneumonitis but need follow-up given her long smoking history. Particular attention to an apparent soft tissue density adjacent to osteophytic change left anterior thoracic spine on CT Plan-follow-up chest CT in mid June

## 2015-04-12 ENCOUNTER — Ambulatory Visit (HOSPITAL_COMMUNITY): Payer: Self-pay | Attending: Cardiovascular Disease

## 2015-04-12 DIAGNOSIS — R011 Cardiac murmur, unspecified: Secondary | ICD-10-CM | POA: Insufficient documentation

## 2015-04-12 NOTE — Progress Notes (Signed)
2D Echo completed. 04/12/2015 

## 2015-04-14 ENCOUNTER — Telehealth: Payer: Self-pay | Admitting: Internal Medicine

## 2015-04-19 ENCOUNTER — Telehealth: Payer: Self-pay | Admitting: Internal Medicine

## 2015-04-19 NOTE — Telephone Encounter (Signed)
Pt is requesting results on echocardiogram.  CY - please advise. Thanks.

## 2015-04-20 NOTE — Telephone Encounter (Signed)
Echo showed mild mitral valve regurgitation and a little stiffness on relaxation of the heart muscle. Normal pumping power of the heart with no major concerns.

## 2015-04-20 NOTE — Telephone Encounter (Signed)
We reviewed the Echo report and discussed mild mitral regurg and grade 1 diastolic dysfunction. Her PCP may eventually consider afterload reduction med, but for now she is asymptomatic. We will discuss again in context when she returns as scheduled.

## 2015-06-12 ENCOUNTER — Ambulatory Visit (INDEPENDENT_AMBULATORY_CARE_PROVIDER_SITE_OTHER)
Admission: RE | Admit: 2015-06-12 | Discharge: 2015-06-12 | Disposition: A | Discharge status: Home / Self Care | Payer: Self-pay | Source: Ambulatory Visit | Attending: Internal Medicine | Admitting: Internal Medicine

## 2015-06-12 DIAGNOSIS — R918 Other nonspecific abnormal finding of lung field: Secondary | ICD-10-CM

## 2015-06-14 ENCOUNTER — Ambulatory Visit (INDEPENDENT_AMBULATORY_CARE_PROVIDER_SITE_OTHER): Payer: Self-pay | Admitting: Internal Medicine

## 2015-06-14 ENCOUNTER — Encounter (INDEPENDENT_AMBULATORY_CARE_PROVIDER_SITE_OTHER): Payer: Self-pay

## 2015-06-14 ENCOUNTER — Encounter: Payer: Self-pay | Admitting: Internal Medicine

## 2015-06-14 VITALS — BP 114/68 | HR 95 | Ht 69.0 in | Wt 145.8 lb

## 2015-06-14 DIAGNOSIS — Z720 Tobacco use: Secondary | ICD-10-CM

## 2015-06-14 DIAGNOSIS — R918 Other nonspecific abnormal finding of lung field: Secondary | ICD-10-CM

## 2015-06-14 DIAGNOSIS — J449 Chronic obstructive pulmonary disease, unspecified: Secondary | ICD-10-CM

## 2015-06-14 DIAGNOSIS — J9601 Acute respiratory failure with hypoxia: Secondary | ICD-10-CM

## 2015-06-14 DIAGNOSIS — J42 Unspecified chronic bronchitis: Secondary | ICD-10-CM

## 2015-06-14 NOTE — Assessment & Plan Note (Signed)
Hopefully with extended abstinence from smoking this will clear. She is asymptomatic now.

## 2015-06-14 NOTE — Progress Notes (Signed)
04/10/15- 37 yoF former smoker, retired Pensions consultant, with hx CAP, acute respiratory failure with hypoxia. Quit smoking 03/07/2015. FOLLOW FOR:  Referred by Dr. Orpah Cobb.  Patient has abnormal CT results. CBC 03/28/15- OK, no eosinophilia Hospitalized March 15-21 with a flu syndrome and pneumonia. Follow-up chest x-ray then CT scan reviewed with her, showing patchy bilateral infiltrates consistent with pneumonia. Infiltrates were peripheral so radiologist questioned possibility of eosinophilic pneumonia area nodular densities in both lower lobes, nonspecific with follow-up recommended. Mild emphysema and lower lobe bronchiectasis. She now feels a little weak but almost back to normal with minimal residual cough and scant white sputum.  CXR 03/29/15- IMPRESSION: 1. Nodular opacities in the right mid lung persist and raise suspicion for possible malignancy. Chest CT is recommended. 2. Thoracic scoliosis and spondylosis. 3. Emphysema. Electronically Signed  By: Gaylyn Rong M.D.  On: 03/29/2015 13:54 CT chest 04/06/15 IMPRESSION: Patchy areas of peripheral infiltrate bilaterally, most consistent with pneumonia. The peripheral nature of these opacities does raise question of possible eosinophilic pneumonitis. Appropriate laboratory assessment advised. Nodular opacities are noted in both lower lobes, a finding which warrants a followup noncontrast enhanced chest CT in approximately 8 weeks. Mild emphysematous change with lower lobe bronchiectasis bilaterally. No adenopathy. Hepatic steatosis. Electronically Signed  By: Bretta Bang III M.D.  06/14/15- 45 yoF former smoker, retired Pensions consultant, with hx CAP, acute respiratory failure with hypoxia. Quit smoking 03/07/2015. FOLLOWS FOR: Review CT Chest results with patient; states her breathing is fine. "Trying to" stay off cigarettes.Some residual cough brown.  2D Echo- 04/12/15 mild mitral valve regurg, Grade 1 diastolic dysfunction CT chest  06/12/15 IMPRESSION: 1. Minimal patchy residual areas of peripheral ground-glass opacity may suggest some residual inflammation or edema but overall the lungs are much improved and no focal infiltrates or worrisome pulmonary lesions are identified. 2. No mediastinal or hilar mass or adenopathy. 3. Bilateral adrenal gland adenomas. Electronically Signed  By: Rudie Meyer M.D.  On: 06/12/2015 10:25   ROS-see HPI   Negative unless "+" Constitutional:    weight loss, night sweats, fevers, chills, fatigue, lassitude. HEENT:    headaches, difficulty swallowing, tooth/dental problems, sore throat,       sneezing, itching, ear ache, nasal congestion, post nasal drip, snoring CV:    chest pain, orthopnea, PND, swelling in lower extremities, anasarca,                                  dizziness, palpitations Resp:   +shortness of breath with exertion or at rest.                +productive cough,   non-productive cough, coughing up of blood.              change in color of mucus.  wheezing.   Skin:    rash or lesions. GI:  No-   heartburn, indigestion, abdominal pain, nausea, vomiting, diarrhea,                 change in bowel habits, loss of appetite GU:  MS:   joint pain, stiffness,  Neuro-     nothing unusual Psych:  change in mood or affect.  +depression or +anxiety.   memory loss.  OBJ- Physical Exam General- Alert, Oriented, Affect-appropriate, Distress- none acute Skin- rash-none, lesions- none, excoriation- none Lymphadenopathy- none Head- atraumatic            Eyes- Gross vision intact, PERRLA, conjunctivae  and secretions clear            Ears- Hearing, canals-normal            Nose- Clear, no-Septal dev, mucus, polyps, erosion, perforation             Throat- Mallampati II , mucosa clear , drainage- none, tonsils- atrophic Neck- flexible , trachea midline, no stridor , thyroid nl, carotid no bruit Chest - symmetrical excursion , unlabored           Heart/CV- RRR , +2/6 systolic  MR  murmur , no gallop  , no rub, nl s1 s2                           - JVD- none , edema- none, stasis changes- none, varices- none           Lung- clear to P&A, wheeze- none, cough- none , dullness-none, rub- none           Chest wall-  Abd-  Br/ Gen/ Rectal- Not done, not indicated Extrem- cyanosis- none, clubbing, none, atrophy- none, strength- nl Neuro- grossly intact to observation

## 2015-06-14 NOTE — Assessment & Plan Note (Signed)
Strongly encouraged to absolutely stay off tobacco products/ smoking

## 2015-06-14 NOTE — Assessment & Plan Note (Signed)
Follow up CT c/w resolution of inflammatory process/ pneumonia. Images were reviewed in detail with her.  Plan- I suggested, because of smoking hx, that she have f/u CXR in a year.

## 2015-06-14 NOTE — Patient Instructions (Signed)
Suggest you ask Dr Orpah Cobb whether you should have more evaluation for the mitral valve heart murmur, which is apparently newly recognized.  Order- [facility]- d/c home O2  Order- future CXR dx chronic bronchitis   To be done at one year ov f/u

## 2015-09-25 ENCOUNTER — Other Ambulatory Visit: Payer: Self-pay | Admitting: Internal Medicine

## 2015-10-17 ENCOUNTER — Telehealth: Payer: Self-pay

## 2015-10-17 NOTE — Telephone Encounter (Signed)
Pt. Returned call. Please f/u with pt. °

## 2015-10-17 NOTE — Telephone Encounter (Signed)
Returned patient phone call Patient not available Left message on voice mail to return our call 

## 2015-10-18 NOTE — Telephone Encounter (Signed)
Patient would like a prescription to treat a cold blister she has. Please f/u

## 2015-10-19 ENCOUNTER — Telehealth: Payer: Self-pay

## 2015-10-19 NOTE — Telephone Encounter (Signed)
Returned patient phone call Patient is requesting a RX for her fever blisters Patient was instructed she can use OTC Abreva

## 2015-10-19 NOTE — Telephone Encounter (Signed)
Patient wanted to be prescribed Acyclovir for her herpes. Please f/u

## 2015-10-19 NOTE — Telephone Encounter (Signed)
Nurse called patient, patient reports " Angelique BlonderDenise has already called me back this morning". Patient has no needs at this time.

## 2015-11-06 ENCOUNTER — Encounter: Payer: Self-pay | Admitting: Internal Medicine

## 2015-12-15 ENCOUNTER — Telehealth: Payer: Self-pay | Admitting: Family Medicine

## 2015-12-15 ENCOUNTER — Other Ambulatory Visit: Payer: Self-pay | Admitting: *Deleted

## 2015-12-15 ENCOUNTER — Ambulatory Visit: Payer: Self-pay | Attending: Family Medicine | Admitting: Family Medicine

## 2015-12-15 ENCOUNTER — Encounter: Payer: Self-pay | Admitting: Family Medicine

## 2015-12-15 VITALS — BP 121/81 | HR 84 | Temp 99.2°F | Resp 16 | Ht 69.0 in | Wt 151.0 lb

## 2015-12-15 DIAGNOSIS — K5900 Constipation, unspecified: Secondary | ICD-10-CM | POA: Insufficient documentation

## 2015-12-15 DIAGNOSIS — Z79899 Other long term (current) drug therapy: Secondary | ICD-10-CM | POA: Insufficient documentation

## 2015-12-15 DIAGNOSIS — A6000 Herpesviral infection of urogenital system, unspecified: Secondary | ICD-10-CM | POA: Insufficient documentation

## 2015-12-15 DIAGNOSIS — R011 Cardiac murmur, unspecified: Secondary | ICD-10-CM | POA: Insufficient documentation

## 2015-12-15 DIAGNOSIS — M542 Cervicalgia: Secondary | ICD-10-CM

## 2015-12-15 DIAGNOSIS — Z0000 Encounter for general adult medical examination without abnormal findings: Secondary | ICD-10-CM | POA: Insufficient documentation

## 2015-12-15 DIAGNOSIS — L609 Nail disorder, unspecified: Secondary | ICD-10-CM | POA: Insufficient documentation

## 2015-12-15 DIAGNOSIS — E559 Vitamin D deficiency, unspecified: Secondary | ICD-10-CM | POA: Insufficient documentation

## 2015-12-15 DIAGNOSIS — I358 Other nonrheumatic aortic valve disorders: Secondary | ICD-10-CM | POA: Insufficient documentation

## 2015-12-15 DIAGNOSIS — Z87891 Personal history of nicotine dependence: Secondary | ICD-10-CM | POA: Insufficient documentation

## 2015-12-15 DIAGNOSIS — Z8249 Family history of ischemic heart disease and other diseases of the circulatory system: Secondary | ICD-10-CM | POA: Insufficient documentation

## 2015-12-15 LAB — VITAMIN B12: Vitamin B-12: 338 pg/mL (ref 211–911)

## 2015-12-15 LAB — LIPID PANEL
CHOLESTEROL: 245 mg/dL — AB (ref 125–200)
HDL: 54 mg/dL (ref >=46)
LDL Cholesterol: 168 mg/dL — ABNORMAL HIGH (ref <130)
TRIGLYCERIDES: 113 mg/dL (ref <150)
Total CHOL/HDL Ratio: 4.5 Ratio (ref <=5.0)
VLDL: 23 mg/dL (ref <30)

## 2015-12-15 MED ORDER — CYCLOBENZAPRINE HCL 10 MG PO TABS: 10.0000 mg | ORAL_TABLET | Freq: Three times a day (TID) | ORAL | Status: DC | PRN

## 2015-12-15 MED ORDER — ACYCLOVIR 400 MG PO TABS: 400.0000 mg | ORAL_TABLET | Freq: Two times a day (BID) | ORAL | Status: DC

## 2015-12-15 NOTE — Telephone Encounter (Signed)
Pt. Called stating that the pharmacy where her Rx were sent to is closed and [facility] [address] Pt. Stated she can wait for her other Rx until Tuesday but needs her Flexeril.    Fax number-[phone]  Please f/u

## 2015-12-15 NOTE — Assessment & Plan Note (Addendum)
A: patient had ECHO with showed mild changes. The cause of murmur is mitral regurg P: Reassurance Reviewed s/s to prompt f/u  BP control probnp

## 2015-12-15 NOTE — Assessment & Plan Note (Signed)
Starting suppressive therapy

## 2015-12-15 NOTE — Progress Notes (Signed)
Subjective:  Patient ID: Cynthia Best, female    DOB: [birth year]  Age: 59 y.o. MRN: [id number]  CC: Neck Pain; Nail Problem; and URI   HPI Cynthia Best presents for   1. Neck pain: x 4 months. Started on L side also on R side. No injury. Daily pain. Occipital area. Constant burning pain. Sometimes sharp. No headaches. Is not radiating down are now. Tried ibuprofen without relief. Pain is 10/10 at worse. Pain is 4.5/10 today.   2. Finger nails: dark line on a few of her nails. Come and go. No pain in nails.    3. Genital herpes: diagnosed in 03/2015. Having monthly outbreaks. Not sexually active.   4. Bloating: in abdomen. No stool. No hx of colonoscopy. Stools has changed. Has intermittent bowel diarrhea and constipation.   5. Heart murmur: diagnosed last year. She has an ECHO in 03/2015 it revealed grade 1 diastolic dysfunction and mild mitral regurgitation.  She has family hx of heart attack and death in her brother and father before age 59. No CP or SOB. No dizziness or lightheadedness. She has quit smoking.   Social History  Substance Use Topics  . Smoking status: Former Smoker -- 1.00 packs/day for 17 years    Types: Cigarettes  . Smokeless tobacco: Former Neurosurgeon    Quit date: 03/07/2015  . Alcohol Use: 0.0 oz/week    0 Standard drinks or equivalent per week     Comment: 2 glass wine daily   Outpatient Prescriptions Prior to Visit  Medication Sig Dispense Refill  . clonazePAM (KLONOPIN) 0.5 MG tablet Take 0.5 mg by mouth at bedtime.    Marland Kitchen QUEtiapine (SEROQUEL XR) 200 MG 24 hr tablet Take 100 mg by mouth at bedtime.    Marland Kitchen rOPINIRole (REQUIP) 1 MG tablet Take 1-2 mg by mouth daily as needed (restless leg syndrome.).    Marland Kitchen sertraline (ZOLOFT) 100 MG tablet Take 150 mg by mouth daily.      No facility-administered medications prior to visit.    ROS Review of Systems  Constitutional: Negative for fever and chills.  Eyes: Negative for visual disturbance.  Respiratory: Negative for  shortness of breath.   Cardiovascular: Negative for chest pain.  Gastrointestinal: Positive for diarrhea and constipation. Negative for nausea, vomiting, abdominal pain, blood in stool, anal bleeding and rectal pain.  Genitourinary: Positive for genital sores.  Musculoskeletal: Positive for neck pain. Negative for back pain and arthralgias.  Skin: Negative for rash.  Allergic/Immunologic: Negative for immunocompromised state.  Hematological: Negative for adenopathy. Does not bruise/bleed easily.  Psychiatric/Behavioral: Negative for suicidal ideas and dysphoric mood.    Objective:  BP 121/81 mmHg  Pulse 84  Temp(Src) 99.2 F (37.3 C) (Oral)  Resp 16  Ht  (1.753 m)  Wt 151 lb (68.493 kg)  BMI 22.29 kg/m2  SpO2 96%  BP/Weight 12/15/2015 06/14/2015 04/10/2015  Systolic BP 121 114 118  Diastolic BP 81 68 76  Wt. (Lbs) 151 145.8 142.4  BMI 22.29 21.52 21.02   Physical Exam  Constitutional: She is oriented to person, place, and time. She appears well-developed and well-nourished. No distress.  HENT:  Head: Normocephalic and atraumatic.  Neck: Normal range of motion. Neck supple. Muscular tenderness (R occipital ) present. No spinous process tenderness present. No rigidity. No edema and normal range of motion present.  Negative spurling   Cardiovascular: Normal rate, regular rhythm, normal heart sounds and intact distal pulses.   Pulmonary/Chest: Effort normal and breath sounds  normal.  Musculoskeletal: She exhibits no edema.  Neurological: She is alert and oriented to person, place, and time.  Skin: Skin is warm and dry. No rash noted.  Dark lines in 2 of 10 finger nails   Psychiatric: She has a normal mood and affect.   Assessment & Plan:   Problem List Items Addressed This Visit    Family history of heart disease in female family member before age 59 (Chronic)   Relevant Orders   Lipid Panel   Genital herpes    Starting suppressive therapy       Relevant Medications     acyclovir (ZOVIRAX) 400 MG tablet   Nail problem    No nail fungus Checking for nutritional deficiency       Relevant Orders   Vitamin D, 25-hydroxy   Vitamin B12   Systolic ejection murmur - Primary (Chronic)    A: patient had ECHO with showed mild changes. The cause of murmur is mitral regurg P: Reassurance Reviewed s/s to prompt f/u  BP control probnp        Other Visit Diagnoses    Posterior neck pain        Relevant Medications    cyclobenzaprine (FLEXERIL) 10 MG tablet    Healthcare maintenance        Relevant Orders    Flu Vaccine QUAD 36+ mos PF IM (Fluarix & Fluzone Quad PF) (Completed)    Ambulatory referral to Gastroenterology    MM DIGITAL SCREENING BILATERAL       No orders of the defined types were placed in this encounter.    Follow-up: No Follow-up on file.   Dessa PhiJosalyn Meliss Fleek MD

## 2015-12-15 NOTE — Patient Instructions (Addendum)
Cynthia Best was seen today for neck pain, nail problem and uri.  Diagnoses and all orders for this visit:  Aortic systolic murmur on examination -     Cancel: Echocardiogram; Future -     Pro b natriuretic peptide  Family history of heart disease in female family member before age 59 -     Lipid Panel  Genital herpes -     acyclovir (ZOVIRAX) 400 MG tablet; Take 1 tablet (400 mg total) by mouth 2 (two) times daily.  Nail problem -     Vitamin D, 25-hydroxy -     Vitamin B12  Posterior neck pain -     cyclobenzaprine (FLEXERIL) 10 MG tablet; Take 1 tablet (10 mg total) by mouth 3 (three) times daily as needed for muscle spasms.  Healthcare maintenance -     Flu Vaccine QUAD 36+ mos PF IM (Fluarix & Fluzone Quad PF) -     Ambulatory referral to Gastroenterology -     MM DIGITAL SCREENING BILATERAL; Future   No repeat ECHO it was done in April 2016. Heart pumping well. Mild mitral regurg (source of murmur). Grade 1 diastolic dysfunction. All very mild changes.   Tylenol severe cold and flu max daytime and nighttime for 3-5 days for cold symptoms Take tylenol up to 1000 mg every 8 hrs as needed for aches and pains  You will be called with lab and  results   F/u in 8 weeks for neck pain and to check on suppression of genital herpes   Dr. Armen PickupFunches   Mitral Valve Regurgitation Mitral valve regurgitation, also called mitral regurgitation, is when blood leaks from the mitral valve. The mitral valve is located between the upper left chamber of the heart (left atrium) and the lower left chamber of the heart (left ventricle). Normally, this valve opens when the atrium pumps blood into the ventricle, and it closes when the ventricle pumps blood out to the body. Mitral valve regurgitation happens when the mitral valve does not close properly. As a result, blood in the ventricle leaks back into the atrium. Mitral valve regurgitation causes the heart to work harder to pump blood. Over time, this can  lead to heart failure. CAUSES  Causes of mitral valve regurgitation include:  Damage to the mitral valve, such as from a birth defect or heart attack.  Infection.  Heart disease.  Mitral valve prolapse. RISK FACTORS You are more likely to develop mitral valve regurgitation if you have:  Mitral valve prolapse.  A heart valve infection.  High blood pressure.  Coronary or rheumatic heart disease.  Swelling in your left ventricle.  Marfan syndrome.  Untreated syphilis. You are also more likely to develop the condition if you have taken certain diet pills in the past. SIGNS AND SYMPTOMS If the condition is mild, you may not have symptoms. If you do have symptoms, they can include:  Shortness of breath with physical activity, like climbing stairs.  Fast or irregular heartbeat.  Cough.  Excessive urination, especially at night.  Heavy breathing.  Extreme tiredness.  Lightheadedness. DIAGNOSIS To diagnose mitral valve regurgitation, your health care provider will listen to your heart for an abnormal heart sound (murmur). Your health care provider may also order tests, such as:  An echocardiogram.  A CT scan.  An MRI. TREATMENT  Treatment may include:  Medicines. These may be given to treat symptoms and prevent complications.  Surgery to repair or replace the mitral valve. This may be done if:  Your heart becomes larger than normal.  Your heart cannot pump blood well.  Your symptoms get worse. HOME CARE INSTRUCTIONS   Take medicines only as directed by your health care provider.  Eat a heart-healthy diet. A dietitian can help you to plan meals.  Do not use any tobacco products, including cigarettes, chewing tobacco, and electronic cigarettes. If you need help quitting, ask your health care provider.  Work closely with your health care provider to manage lasting conditions, such as diabetes and high blood pressure.  Maintain a healthy weight and stay  physically active. Ask your health care provider to recommend some activities that are safe for you to do.  Limit alcohol intake to no more than 1 drink per day for nonpregnant women and 2 drinks per day for men. One drink equals 12 ounces of beer, 5 ounces of wine, or 1 ounces of hard liquor.  Try to get at least 7 hours of sleep each night.  Find ways to manage stress. SEEK IMMEDIATE MEDICAL CARE IF:  You have shortness of breath.  You develop chest pain.  You sweat.  You feel nauseous.  You have swelling in your hands, feet, ankles, or abdomen that is getting worse.  You have a feeling of fullness in your abdomen.  You lose your appetite.  You feel dizzy or unsteady.  You have blurred vision or a headache.  Any of your symptoms begin to get worse.  You develop muscle aches, chills, or fever.  You feel ill.   This information is not intended to replace advice given to you by your health care provider. Make sure you discuss any questions you have with your health care provider.   Document Released: 02/26/2005 Document Revised: 12/30/2014 Document Reviewed: 05/11/2014 Elsevier Interactive Patient Education [employer]

## 2015-12-15 NOTE — Assessment & Plan Note (Signed)
No nail fungus Checking for nutritional deficiency

## 2015-12-15 NOTE — Progress Notes (Signed)
C/C neck pain x 4 month  Stated been having diarrhea and swelling  Hx pneumonia, Herpes  No tobacco user  No  Suicidal thought in the past two weeks Pain #4

## 2015-12-15 NOTE — Assessment & Plan Note (Signed)
A: strong fam hx of CAD. Patient has quit smoking P: Check lipids and likely initiate statin for primary prevention

## 2015-12-16 LAB — PRO B NATRIURETIC PEPTIDE: Pro B Natriuretic peptide (BNP): 47.23 pg/mL (ref <126)

## 2015-12-16 LAB — VITAMIN D 25 HYDROXY (VIT D DEFICIENCY, FRACTURES): Vit D, 25-Hydroxy: 14 ng/mL — ABNORMAL LOW (ref 30–100)

## 2015-12-19 ENCOUNTER — Telehealth: Payer: Self-pay | Admitting: Internal Medicine

## 2015-12-19 DIAGNOSIS — E559 Vitamin D deficiency, unspecified: Secondary | ICD-10-CM | POA: Insufficient documentation

## 2015-12-19 MED ORDER — VITAMIN D (ERGOCALCIFEROL) 1.25 MG (50000 UNIT) PO CAPS: 50000.0000 [IU] | ORAL_CAPSULE | ORAL | Status: DC

## 2015-12-19 NOTE — Telephone Encounter (Signed)
Pt. States that vitamin D was called in to her pharmacy, but she is unaware of why she was prescribed this medication...Marland Kitchen.Marland Kitchen.She believes it might have to do with her blood work results, but she has yet to receive the results....please follow up with patient.

## 2015-12-19 NOTE — Addendum Note (Signed)
Addended by: Dessa PhiFUNCHES, Tiki Tucciarone on: 12/19/2015 09:12 AM   Modules accepted: Orders, SmartSet

## 2015-12-28 NOTE — Telephone Encounter (Signed)
-----   Message from Dessa PhiJosalyn Funches, MD sent at 12/19/2015  9:10 AM EST ----- Vit D improved but still low at 14, will supplement x 12 weeks.  proBNP normal  Vit b12 normal  Total cholesterol and LDL slightly elevated, patient low risk for heart disease and stroke given she has quit smoking, no HTN or DM. I recommend low carb diet, focus on healthy fats (avocado, fish, olive oil, coconut oil)  and increase exercise.

## 2015-12-28 NOTE — Telephone Encounter (Signed)
Date of birth verified by pt  Lab results given  Advised to maintain a low fat low carb diet and exercise  Pt verbalized understanding

## 2016-01-25 ENCOUNTER — Other Ambulatory Visit: Payer: Self-pay | Admitting: Family Medicine

## 2016-01-25 DIAGNOSIS — Z1231 Encounter for screening mammogram for malignant neoplasm of breast: Secondary | ICD-10-CM

## 2016-03-28 ENCOUNTER — Ambulatory Visit: Payer: Self-pay | Admitting: Family Medicine

## 2016-04-15 ENCOUNTER — Ambulatory Visit: Payer: Self-pay | Admitting: Family Medicine

## 2016-06-14 ENCOUNTER — Ambulatory Visit: Payer: Self-pay | Admitting: Internal Medicine

## 2016-09-03 ENCOUNTER — Telehealth: Payer: Self-pay | Admitting: Family Medicine

## 2016-09-03 DIAGNOSIS — A6000 Herpesviral infection of urogenital system, unspecified: Secondary | ICD-10-CM

## 2016-09-03 MED ORDER — ACYCLOVIR 400 MG PO TABS
400.0000 mg | ORAL_TABLET | Freq: Two times a day (BID) | ORAL | 1 refills | Status: DC
Start: 2016-09-03 — End: 2017-01-24

## 2016-09-03 NOTE — Telephone Encounter (Signed)
[facility] ready

## 2016-09-03 NOTE — Telephone Encounter (Signed)
Pt called requesting medication refill on acyclovir (ZOVIRAX) 400 MG tablet, pt states she has had a breakout. Please f/up pt has changed pharmacy to  [facility] on [address]

## 2016-09-03 NOTE — Telephone Encounter (Signed)
Will forward request to Dr. Funches 

## 2016-10-24 ENCOUNTER — Ambulatory Visit: Payer: Self-pay | Attending: Family Medicine | Admitting: Physician Assistant

## 2016-10-24 VITALS — BP 127/85 | HR 99 | Temp 98.6°F | Resp 16 | Wt 142.6 lb

## 2016-10-24 DIAGNOSIS — M1990 Unspecified osteoarthritis, unspecified site: Secondary | ICD-10-CM | POA: Insufficient documentation

## 2016-10-24 DIAGNOSIS — Z1211 Encounter for screening for malignant neoplasm of colon: Secondary | ICD-10-CM

## 2016-10-24 DIAGNOSIS — Z131 Encounter for screening for diabetes mellitus: Secondary | ICD-10-CM

## 2016-10-24 DIAGNOSIS — M542 Cervicalgia: Secondary | ICD-10-CM | POA: Insufficient documentation

## 2016-10-24 DIAGNOSIS — R51 Headache: Secondary | ICD-10-CM | POA: Insufficient documentation

## 2016-10-24 DIAGNOSIS — M62838 Other muscle spasm: Secondary | ICD-10-CM | POA: Insufficient documentation

## 2016-10-24 DIAGNOSIS — M47812 Spondylosis without myelopathy or radiculopathy, cervical region: Secondary | ICD-10-CM | POA: Insufficient documentation

## 2016-10-24 DIAGNOSIS — Z23 Encounter for immunization: Secondary | ICD-10-CM

## 2016-10-24 DIAGNOSIS — M419 Scoliosis, unspecified: Secondary | ICD-10-CM | POA: Insufficient documentation

## 2016-10-24 DIAGNOSIS — F329 Major depressive disorder, single episode, unspecified: Secondary | ICD-10-CM | POA: Insufficient documentation

## 2016-10-24 LAB — GLUCOSE, POCT (MANUAL RESULT ENTRY): POC GLUCOSE: 116 mg/dL — AB (ref 70–99)

## 2016-10-24 MED ORDER — PREDNISONE 20 MG PO TABS: ORAL_TABLET | ORAL | 0 refills | Status: DC

## 2016-10-24 MED ORDER — METHOCARBAMOL 500 MG PO TABS: 500.0000 mg | ORAL_TABLET | Freq: Four times a day (QID) | ORAL | 0 refills | Status: DC

## 2016-10-24 NOTE — Progress Notes (Signed)
Pt states she is having neck pain Pt states her neck has been hurting for 2 months Pt states ibuprofen hasn't been helping  Pt is also needing a referral

## 2016-10-24 NOTE — Progress Notes (Signed)
Cynthia Best, is a 60 y.o. female  ZOX:[id number]  [id number]  DOB - 03/15/1956  Subjective:  Chief Complaint and HPI: Cynthia Best is a 60 y.o. female here today for pain in the back of her head on the Right/neck pain X 2 months.  She has taken a lot of ibuprofen over the last couple of months which seemed to help initially but not so much now.  She is thinking she may need prednisone.  NKI to neck.  I reviewed C-spine films from 2013 which showed spondylosis and degenerative changes.  She denies radicular s/sx.  No arm/hand paresthesias or weakness.  She would also like a referral for colonoscopy.  She does not have insurance but says her brother can help her get in at [facility] with Karena AddisonMary Shearin, MD if we send a referral.     ROS:   Constitutional:  No f/c, No night sweats, No unexplained weight loss. EENT:  No vision changes, No blurry vision, No hearing changes. No mouth, throat, or ear problems.  Respiratory: No cough, No SOB Cardiac: No CP, no palpitations GI:  No abd pain, No N/V/D. GU: No Urinary s/sx Musculoskeletal: No joint pain Neuro: +pain in back R of head, no dizziness, no motor weakness.  Skin: No rash Endocrine:  No polydipsia. No polyuria.  Psych: Denies SI/HI  No problems updated.  ALLERGIES: Allergies  Allergen Reactions  . Thimerosal Swelling    In eyes, due to thimerosal in contact solution   . Sulfa Antibiotics Other (See Comments)    Doesn't remember     PAST MEDICAL HISTORY: Past Medical History:  Diagnosis Date  . Arthritis   . Depression   . Herpes   . Scoliosis   . Spondylolysis of cervical region     MEDICATIONS AT HOME: Prior to Admission medications   Medication Sig Start Date End Date Taking? Authorizing Provider  amphetamine-dextroamphetamine (ADDERALL) 20 MG tablet Take 20 mg by mouth 2 (two) times daily.   Yes Historical Provider, MD  clonazePAM (KLONOPIN) 0.5 MG tablet Take 0.5 mg by mouth at bedtime.   Yes Historical  Provider, MD  FLUoxetine (PROZAC) 40 MG capsule Take 40 mg by mouth daily.   Yes Historical Provider, MD  QUEtiapine (SEROQUEL) 100 MG tablet Take 100 mg by mouth at bedtime. Take 1 and 1/2   Yes Historical Provider, MD  ropinirole (REQUIP) 5 MG tablet Take 5 mg by mouth 2 (two) times daily.   Yes Historical Provider, MD  acyclovir (ZOVIRAX) 400 MG tablet Take 1 tablet (400 mg total) by mouth 2 (two) times daily. Patient not taking: Reported on 10/24/2016 09/03/16   Dessa PhiJosalyn Funches, MD  cyclobenzaprine (FLEXERIL) 10 MG tablet Take 1 tablet (10 mg total) by mouth 3 (three) times daily as needed for muscle spasms. Patient not taking: Reported on 10/24/2016 12/15/15   Dessa PhiJosalyn Funches, MD  FLUoxetine (PROZAC) 20 MG capsule Take 20 mg by mouth daily.    Historical Provider, MD  methocarbamol (ROBAXIN) 500 MG tablet Take 1 tablet (500 mg total) by mouth 4 (four) times daily. X10 days then prn muscle spasm 10/24/16   Anders SimmondsAngela M Karagan Lehr, PA-C  predniSONE (DELTASONE) 20 MG tablet 3,3,3,2,2,2,1,1,1 take each days dose in am with food 10/24/16   Anders SimmondsAngela M Kalvin Buss, PA-C  QUEtiapine (SEROQUEL XR) 200 MG 24 hr tablet Take 100 mg by mouth at bedtime.    Historical Provider, MD  rOPINIRole (REQUIP) 1 MG tablet Take 1-2 mg by mouth daily as  needed (restless leg syndrome.).    Historical Provider, MD  sertraline (ZOLOFT) 100 MG tablet Take 150 mg by mouth daily. Reported on 12/15/2015    Historical Provider, MD  Vitamin D, Ergocalciferol, (DRISDOL) 50000 UNITS CAPS capsule Take 1 capsule (50,000 Units total) by mouth every 7 (seven) days. For 12 weeks Patient not taking: Reported on 10/24/2016 12/19/15   Dessa PhiJosalyn Funches, MD     Objective:  EXAM:   Vitals:   10/24/16 1442  BP: 127/85  Pulse: 99  Resp: 16  Temp: 98.6 F (37 C)  TempSrc: Oral  SpO2: 97%  Weight: 142 lb 9.6 oz (64.7 kg)    General appearance : A&OX3. NAD. Non-toxic-appearing HEENT: Atraumatic and Normocephalic.  PERRLA. EOM intact.  TM clear  B. Mouth-MMM, post pharynx WNL w/o erythema, No PND. Neck: supple, no JVD. No cervical lymphadenopathy. No thyromegaly Chest/Lungs:  Breathing-non-labored, Good air entry bilaterally, breath sounds normal without rales, rhonchi, or wheezing  CVS: S1 S2 regular, no murmurs, gallops, rubs  Extremities: Bilateral Lower Ext shows no edema, both legs are warm to touch with = pulse throughout.  BUE with normal S&ROM.  Normal DTR=B.  Sensory/grip all intact.   Neck:  No c-spine TTP.  There is TTP at the superior aspect of the trapezius into the R occipital area of the head.  No gross abnormality of the region.  She does have scoliosis (known) with a noticeable curve to the L in the thoracic spine.   Neurology:  CN II-XII grossly intact, Non focal.   Psych:  TP linear. J/I WNL. Normal speech. Appropriate eye contact and affect.  Skin:  No Rash  Data Review Lab Results  Component Value Date   HGBA1C 5.8 (H) 03/28/2015     Assessment & Plan   1. Encounter for immunization  - Flu Vaccine QUAD 36+ mos IM -  2. Neck pain/back of head pain on R NSAIDS with only minimal relief - predniSONE (DELTASONE) 20 MG tablet; 3,3,3,2,2,2,1,1,1 take each days dose in am with food  Dispense: 18 tablet; Refill: 0  3. Muscle spasm - methocarbamol (ROBAXIN) 500 MG tablet; Take 1 tablet (500 mg total) by mouth 4 (four) times daily. X10 days then prn muscle spasm  Dispense: 90 tablet; Refill: 0  4. Screening for diabetes mellitus  prior to initiating prednisone - Glucose (CBG)  5. Special screening for malignant neoplasms, colon - Ambulatory referral to Gastroenterology  Patient have been counseled extensively about nutrition and exercise  Return in about 3 weeks (around 11/14/2016) for with Dr Armen PickupFunches for CPE and bloodwork; f/up neck/head pain.  The patient was given clear instructions to go to ER or return to medical center if symptoms don't improve, worsen or new problems develop. The patient verbalized  understanding. The patient was told to call to get lab results if they haven't heard anything in the next week.     Georgian CoAngela Blanche Gallien, PA-C [facility] [place], [place] [phone]   10/24/2016, 5:01 PMPatient ID: Cynthia Best, female   DOB: 11/11/1956, 60 y.o.   MRN: [id number]

## 2016-10-24 NOTE — Patient Instructions (Signed)

## 2017-01-24 ENCOUNTER — Encounter: Payer: Self-pay | Admitting: Family Medicine

## 2017-01-24 ENCOUNTER — Encounter (INDEPENDENT_AMBULATORY_CARE_PROVIDER_SITE_OTHER): Payer: Self-pay

## 2017-01-24 ENCOUNTER — Ambulatory Visit: Payer: BLUE CROSS/BLUE SHIELD | Attending: Family Medicine | Admitting: Family Medicine

## 2017-01-24 VITALS — BP 149/89 | HR 89 | Temp 99.0°F | Ht 69.0 in | Wt 142.6 lb

## 2017-01-24 DIAGNOSIS — Z1231 Encounter for screening mammogram for malignant neoplasm of breast: Secondary | ICD-10-CM | POA: Diagnosis not present

## 2017-01-24 DIAGNOSIS — Z87891 Personal history of nicotine dependence: Secondary | ICD-10-CM | POA: Insufficient documentation

## 2017-01-24 DIAGNOSIS — G2581 Restless legs syndrome: Secondary | ICD-10-CM | POA: Diagnosis not present

## 2017-01-24 DIAGNOSIS — A6000 Herpesviral infection of urogenital system, unspecified: Secondary | ICD-10-CM | POA: Insufficient documentation

## 2017-01-24 DIAGNOSIS — M542 Cervicalgia: Secondary | ICD-10-CM | POA: Insufficient documentation

## 2017-01-24 DIAGNOSIS — Z23 Encounter for immunization: Secondary | ICD-10-CM | POA: Diagnosis not present

## 2017-01-24 DIAGNOSIS — Z0000 Encounter for general adult medical examination without abnormal findings: Secondary | ICD-10-CM | POA: Diagnosis not present

## 2017-01-24 DIAGNOSIS — Z1159 Encounter for screening for other viral diseases: Secondary | ICD-10-CM | POA: Diagnosis not present

## 2017-01-24 DIAGNOSIS — R2231 Localized swelling, mass and lump, right upper limb: Secondary | ICD-10-CM | POA: Diagnosis not present

## 2017-01-24 DIAGNOSIS — L570 Actinic keratosis: Secondary | ICD-10-CM

## 2017-01-24 LAB — COMPLETE METABOLIC PANEL WITH GFR
ALT: 16 U/L (ref 6–29)
AST: 17 U/L (ref 10–35)
Albumin: 4.5 g/dL (ref 3.6–5.1)
Alkaline Phosphatase: 58 U/L (ref 33–130)
BUN: 11 mg/dL (ref 7–25)
CALCIUM: 9.6 mg/dL (ref 8.6–10.4)
CHLORIDE: 103 mmol/L (ref 98–110)
CO2: 27 mmol/L (ref 20–31)
CREATININE: 0.69 mg/dL (ref 0.50–0.99)
GFR, Est Non African American: >89 mL/min (ref >=60)
Glucose, Bld: 95 mg/dL (ref 65–99)
POTASSIUM: 4.1 mmol/L (ref 3.5–5.3)
Sodium: 137 mmol/L (ref 135–146)
Total Bilirubin: 0.5 mg/dL (ref 0.2–1.2)
Total Protein: 7.2 g/dL (ref 6.1–8.1)

## 2017-01-24 LAB — LIPID PANEL
Cholesterol: 234 mg/dL — ABNORMAL HIGH (ref <200)
HDL: 77 mg/dL (ref >50)
LDL CALC: 140 mg/dL — AB (ref <100)
TRIGLYCERIDES: 84 mg/dL (ref <150)
Total CHOL/HDL Ratio: 3 Ratio (ref <5.0)
VLDL: 17 mg/dL (ref <30)

## 2017-01-24 LAB — CBC
HEMATOCRIT: 43.1 % (ref 35.0–45.0)
Hemoglobin: 14.5 g/dL (ref 11.7–15.5)
MCH: 32.2 pg (ref 27.0–33.0)
MCHC: 33.6 g/dL (ref 32.0–36.0)
MCV: 95.8 fL (ref 80.0–100.0)
MPV: 9.4 fL (ref 7.5–12.5)
Platelets: 300 10*3/uL (ref 140–400)
RBC: 4.5 MIL/uL (ref 3.80–5.10)
RDW: 13 % (ref 11.0–15.0)
WBC: 6 10*3/uL (ref 3.8–10.8)

## 2017-01-24 MED ORDER — ACYCLOVIR 400 MG PO TABS: 400.0000 mg | ORAL_TABLET | Freq: Three times a day (TID) | ORAL | 1 refills | Status: DC

## 2017-01-24 MED ORDER — ZOSTER VACCINE LIVE 19400 UNT/0.65ML ~~LOC~~ SUSR: 0.6500 mL | Freq: Once | SUBCUTANEOUS | 0 refills | Status: AC

## 2017-01-24 MED ORDER — ROPINIROLE HCL 1 MG PO TABS: 2.0000 mg | ORAL_TABLET | Freq: Every day | ORAL | 5 refills | Status: DC

## 2017-01-24 MED ORDER — ZOSTER VACCINE LIVE 19400 UNT/0.65ML ~~LOC~~ SUSR: 0.6500 mL | Freq: Once | SUBCUTANEOUS | 0 refills | Status: DC

## 2017-01-24 MED ORDER — MELOXICAM 15 MG PO TABS: 15.0000 mg | ORAL_TABLET | Freq: Every day | ORAL | 0 refills | Status: DC

## 2017-01-24 NOTE — Progress Notes (Signed)
SUBJECTIVE:  61 y.o. female for annual routinecheckup. She is s/p hysterectomy and dose not need pap smear.  She is s/p screening c-scope on 11/22/2016 with biopsy of one hyperplastic polyp from the sigmoid colon.   Social History  Substance Use Topics  . Smoking status: Former Smoker    Packs/day: 1.00    Years: 17.00    Types: Cigarettes  . Smokeless tobacco: Former NeurosurgeonUser    Quit date: 03/07/2015  . Alcohol use 0.0 oz/week     Comment: 2 glass wine daily    Current Outpatient Prescriptions  Medication Sig Dispense Refill  . acyclovir (ZOVIRAX) 400 MG tablet Take 1 tablet (400 mg total) by mouth 2 (two) times daily. 60 tablet 1  . amphetamine-dextroamphetamine (ADDERALL) 20 MG tablet Take 20 mg by mouth 2 (two) times daily.    . clonazePAM (KLONOPIN) 0.5 MG tablet Take 0.5 mg by mouth at bedtime.    Marland Kitchen. FLUoxetine (PROZAC) 40 MG capsule Take 40 mg by mouth daily.    Marland Kitchen. rOPINIRole (REQUIP) 1 MG tablet Take 1-2 mg by mouth daily as needed (restless leg syndrome.).    Marland Kitchen. cyclobenzaprine (FLEXERIL) 10 MG tablet Take 1 tablet (10 mg total) by mouth 3 (three) times daily as needed for muscle spasms. (Patient not taking: Reported on 10/24/2016) 30 tablet 0  . FLUoxetine (PROZAC) 20 MG capsule Take 20 mg by mouth daily.    . methocarbamol (ROBAXIN) 500 MG tablet Take 1 tablet (500 mg total) by mouth 4 (four) times daily. X10 days then prn muscle spasm (Patient not taking: Reported on 01/24/2017) 90 tablet 0  . predniSONE (DELTASONE) 20 MG tablet 3,3,3,2,2,2,1,1,1 take each days dose in am with food (Patient not taking: Reported on 01/24/2017) 18 tablet 0  . QUEtiapine (SEROQUEL XR) 200 MG 24 hr tablet Take 100 mg by mouth at bedtime.    Marland Kitchen. QUEtiapine (SEROQUEL) 100 MG tablet Take 100 mg by mouth at bedtime. Take 1 and 1/2    . ropinirole (REQUIP) 5 MG tablet Take 5 mg by mouth 2 (two) times daily.    . sertraline (ZOLOFT) 100 MG tablet Take 150 mg by mouth daily. Reported on 12/15/2015    . Vitamin D,  Ergocalciferol, (DRISDOL) 50000 UNITS CAPS capsule Take 1 capsule (50,000 Units total) by mouth every 7 (seven) days. For 12 weeks (Patient not taking: Reported on 10/24/2016) 12 capsule 0   No current facility-administered medications for this visit.    Allergies: Thimerosal and Sulfa antibiotics  No LMP recorded. Patient has had a hysterectomy.  ROS:  Feeling well. No dyspnea or chest pain on exertion.  No abdominal pain, change in bowel habits, black or bloody stools.  No urinary tract symptoms. GYN ROS: no breast pain or new or enlarging lumps on self exam. No neurological complaints. MSK: R sided neck pains. DERM: scaly papules on face   OBJECTIVE:  The patient appears well, alert, oriented x 3, in no distress. BP (!) 149/89 (BP Location: Left Arm, Patient Position: Sitting, Cuff Size: Small)   Pulse 89   Temp 99 F (37.2 C) (Oral)   Ht 5\' 9"  (1.753 m)   Wt 142 lb 9.6 oz (64.7 kg)   SpO2 98%   BMI 21.06 kg/m  ENT normal.  Neck supple. No adenopathy or thyromegaly. PERLA. Lungs are clear, good air entry, no wheezes, rhonchi or rales. S1 and S2 normal, systolic  murmur, regular rate and rhythm. Abdomen soft without tenderness, guarding, mass or organomegaly. Extremities show  no edema, normal peripheral pulses. Neurological is normal, no focal findings.  BREAST EXAM: breasts appear normal, no suspicious masses, no skin or nipple changes or axillary nodes  PELVIC EXAM: normal external genitalia, vulva, vagina, cervix, uterus and adnexa, examination not indicated  ASSESSMENT:  well woman  PLAN:  Mammogram  Natali was seen today for annual exam.  Diagnoses and all orders for this visit:  Healthcare maintenance -     Lipid Panel -     COMPLETE METABOLIC PANEL WITH GFR -     CBC -     Vitamin B12  Visit for screening mammogram -     MM DIGITAL SCREENING BILATERAL; Future  Need for hepatitis C screening test -     Hepatitis C antibody, reflex  Need for vaccination for  zoster -     Discontinue: Zoster Vaccine Live, PF, (ZOSTAVAX) 40981 UNT/0.65ML injection; Inject 19,400 Units into the skin once. -     Zoster Vaccine Live, PF, (ZOSTAVAX) 19147 UNT/0.65ML injection; Inject 19,400 Units into the skin once.  Neck pain -     DG Cervical Spine 2 or 3 views; Future -     meloxicam (MOBIC) 15 MG tablet; Take 1 tablet (15 mg total) by mouth daily.  Nodule of finger of right hand -     DG Finger Index Right; Future  Restless leg syndrome -     rOPINIRole (REQUIP) 1 MG tablet; Take 2 tablets (2 mg total) by mouth at bedtime.  Actinic keratoses  Genital herpes simplex, unspecified site -     acyclovir (ZOVIRAX) 400 MG tablet; Take 1 tablet (400 mg total) by mouth 3 (three) times daily. For 5 days as needed for breakouts  Other orders -     Hepatitis C antibody

## 2017-01-24 NOTE — Patient Instructions (Addendum)
Cynthia Best was seen today for annual exam.  Diagnoses and all orders for this visit:  Healthcare maintenance -     Lipid Panel -     COMPLETE METABOLIC PANEL WITH GFR -     CBC -     Vitamin B12  Visit for screening mammogram -     MM DIGITAL SCREENING BILATERAL; Future  Need for hepatitis C screening test -     Hepatitis C antibody, reflex  Need for vaccination for zoster -     Zoster Vaccine Live, PF, (ZOSTAVAX) 52841 UNT/0.65ML injection; Inject 19,400 Units into the skin once.  Neck pain -     DG Cervical Spine 2 or 3 views; Future -     meloxicam (MOBIC) 15 MG tablet; Take 1 tablet (15 mg total) by mouth daily.  Nodule of finger of right hand -     DG Finger Index Right; Future  Restless leg syndrome -     rOPINIRole (REQUIP) 1 MG tablet; Take 2 tablets (2 mg total) by mouth at bedtime.  Actinic keratoses  Genital herpes simplex, unspecified site -     acyclovir (ZOVIRAX) 400 MG tablet; Take 1 tablet (400 mg total) by mouth 3 (three) times daily. For 5 days as needed for breakouts    F/u in 2-3 weeks for cryotherapy for actinic keratoses   Dr. Armen Pickup    Actinic Keratosis An actinic keratosis is a precancerous growth on the skin. This means that it could develop into skin cancer if it is not treated. About 1% of these growths (actinic keratoses) turn into skin cancer within one year if they are not treated. It is important to have all of these growths evaluated to determine the best treatment approach. What are the causes? This condition is caused by getting too much ultraviolet (UV) radiation from the sun or other UV light sources. What increases the risk? The following factors may make you more likely to develop this condition:  Having light-colored skin and blue eyes.  Having blonde or red hair.  Spending a lot of time in the sun.  Inadequate skin protection when outdoors. This may include:  Not using sunscreen properly.  Not covering up skin that is  exposed to sunlight.  Aging. The risk of developing an actinic keratosis increases with age. What are the signs or symptoms? Actinic keratoses look like scaly, rough spots of skin.They can be as small as a pinhead or as big as a quarter. They may itch, hurt, or feel sensitive. In most cases, the growths become red. In some cases, they may be skin-colored, light tan, dark tan, pink, or a combination of any of these colors. There may be a small piece of pink or gray skin (skin tag) growing from the actinic keratosis. In some cases, it may be easier to notice actinic keratoses by feeling them, rather than seeing them. Actinic keratoses appear most often on areas of skin that get a lot of sun exposure, including the scalp, face, ears, lips, upper back, forearms, and the backs of the hands. Sometimes, actinic keratoses disappear, but many reappear a few days to a few weeks later. How is this diagnosed? This condition is usually diagnosed with a physical exam. A tissue sample may be removed from the actinic keratosis and examined under a microscope (biopsy). How is this treated?   Treatment for this condition may include:  Scraping off the actinic keratosis (curettage).  Freezing the actinic keratosis with liquid nitrogen (cryosurgery). This causes  the growth to eventually fall off the skin.  Applying medicated creams or gels to destroy the cells in the growth.  Applying chemicals to the actinic keratosis to make the outer layers of skin peel off (chemical peel).  Photodynamic therapy. In this procedure, medicated cream is applied to the actinic keratosis. This cream increases your skin's sensitivity to light. Then, a strong light is aimed at the actinic keratosis to destroy cells in the growth. Follow these instructions at home: Skin care  Apply cool, wet cloths (cool compresses) to the affected areas.  Do not scratch your skin.  Check your skin regularly for any growths, especially growths  that:  Start to itch or bleed.  Change in size, shape, or color. Caring for the treated area  Keep the treated area clean and dry as told by your health care provider.  Do not apply any medicine, cream, or lotion to the treated area unless your health care provider tells you to do that.  Do not pick at blisters or try to break them open. This can cause infection and scarring.  If you have red or irritated skin after treatment, follow instructions from your health care provider about how to take care of the treated area. Make sure you:  Wash your hands with soap and water before you change your bandage (dressing). If soap and water are not available, use hand sanitizer.  Change your dressing as told by your health care provider.  If you have red or irritated skin after treatment, check your treated area every day for signs of infection. Check for:  Swelling, pain, or more redness.  Fluid or blood.  Warmth.  Pus or a bad smell. General instructions  Take over-the-counter and prescription medicines only as told by your health care provider.  Return to your normal activities as told by your health care provider. Ask your health care provider what activities are safe for you.  Do not use any tobacco products, such as cigarettes, chewing tobacco, and e-cigarettes. If you need help quitting, ask your health care provider.  Have a skin exam done every year by a health care provider who is a skin conditions specialist (dermatologist).  Keep all follow-up visits as told by your health care provider. This is important. How is this prevented?  Do not get sunburns.  Try to avoid the sun between 10:00 a.m. and 4:00 p.m. This is when the UV light is the strongest.  Use a sunscreen or sunblock with SPF 30 (sun protection factor 30) or greater.  Apply sunscreen before you are exposed to sunlight, and reapply periodically as often as directed by the instructions on the sunscreen  container.  Always wear sunglasses that have UV protection, and always wear hats and clothing to protect your skin from sunlight.  When possible, avoid medicines that increase your sensitivity to sunlight. These include:  Certain antibiotic medicines.  Certain water pills (diuretics).  Certain prescription medicines that are used to treat acne (retinoids).  Do not use tanning beds or other indoor tanning devices. Contact a health care provider if:  You notice any changes or new growths on your skin.  You have swelling, pain, or more redness around your treated area.  You have fluid or blood coming from your treated area.  Your treated area feels warm to the touch.  You have pus or a bad smell coming from your treated area.  You have a fever.  You have a blister that becomes large and painful.  This information is not intended to replace advice given to you by your health care provider. Make sure you discuss any questions you have with your health care provider. Document Released: 03/07/2009 Document Revised: 08/09/2016 Document Reviewed: 08/19/2015 Elsevier Interactive Patient Education  2017 [employer].

## 2017-01-25 LAB — VITAMIN B12: Vitamin B-12: 408 pg/mL (ref 200–1100)

## 2017-01-25 LAB — HEPATITIS C ANTIBODY: HCV AB: NEGATIVE

## 2017-01-26 NOTE — Assessment & Plan Note (Signed)
Scaly plaques on face consistent with actinic keratoses Plan for cryotherapy at follow up

## 2017-01-29 ENCOUNTER — Telehealth: Payer: Self-pay

## 2017-01-29 NOTE — Telephone Encounter (Signed)
Pt was called and a VM was left informing pt to return phone call for lab results. 

## 2017-02-03 ENCOUNTER — Telehealth: Payer: Self-pay

## 2017-02-03 NOTE — Telephone Encounter (Signed)
Pt was called and informed of lab results. 

## 2017-03-25 ENCOUNTER — Ambulatory Visit: Payer: BLUE CROSS/BLUE SHIELD

## 2017-05-07 ENCOUNTER — Encounter: Payer: Self-pay | Admitting: Family Medicine

## 2017-06-20 ENCOUNTER — Emergency Department (HOSPITAL_COMMUNITY)
Admission: EM | Admit: 2017-06-20 | Discharge: 2017-06-21 | Disposition: A | Discharge status: Home / Self Care | Payer: BLUE CROSS/BLUE SHIELD | Attending: Emergency Medicine | Admitting: Emergency Medicine

## 2017-06-20 ENCOUNTER — Emergency Department (HOSPITAL_COMMUNITY): Payer: BLUE CROSS/BLUE SHIELD

## 2017-06-20 DIAGNOSIS — Z87891 Personal history of nicotine dependence: Secondary | ICD-10-CM | POA: Insufficient documentation

## 2017-06-20 DIAGNOSIS — Z79899 Other long term (current) drug therapy: Secondary | ICD-10-CM | POA: Insufficient documentation

## 2017-06-20 DIAGNOSIS — J181 Lobar pneumonia, unspecified organism: Secondary | ICD-10-CM | POA: Diagnosis not present

## 2017-06-20 DIAGNOSIS — R509 Fever, unspecified: Secondary | ICD-10-CM | POA: Insufficient documentation

## 2017-06-20 DIAGNOSIS — J189 Pneumonia, unspecified organism: Secondary | ICD-10-CM

## 2017-06-20 MED ORDER — ACETAMINOPHEN 325 MG PO TABS
650.0000 mg | ORAL_TABLET | Freq: Once | ORAL | Status: AC | PRN
  Administered 2017-06-20: 650 mg via ORAL
  Filled 2017-06-20: qty 2

## 2017-06-20 NOTE — ED Triage Notes (Signed)
Pt c/o cough for past ten days. Had a fever of 102 today at home. Hx of pneumonia.

## 2017-06-21 LAB — BASIC METABOLIC PANEL
ANION GAP: 9 (ref 5–15)
BUN: 9 mg/dL (ref 6–20)
CHLORIDE: 100 mmol/L — AB (ref 101–111)
CO2: 27 mmol/L (ref 22–32)
Calcium: 9.1 mg/dL (ref 8.9–10.3)
Creatinine, Ser: 0.71 mg/dL (ref 0.44–1.00)
GFR calc Af Amer: >60 mL/min (ref >60)
GFR calc non Af Amer: >60 mL/min (ref >60)
Glucose, Bld: 115 mg/dL — ABNORMAL HIGH (ref 65–99)
POTASSIUM: 3.5 mmol/L (ref 3.5–5.1)
Sodium: 136 mmol/L (ref 135–145)

## 2017-06-21 LAB — CBC
HCT: 40.4 % (ref 36.0–46.0)
HEMOGLOBIN: 14.3 g/dL (ref 12.0–15.0)
MCH: 33 pg (ref 26.0–34.0)
MCHC: 35.4 g/dL (ref 30.0–36.0)
MCV: 93.3 fL (ref 78.0–100.0)
Platelets: 301 10*3/uL (ref 150–400)
RBC: 4.33 MIL/uL (ref 3.87–5.11)
RDW: 12.7 % (ref 11.5–15.5)
WBC: 13.3 10*3/uL — AB (ref 4.0–10.5)

## 2017-06-21 LAB — CG4 I-STAT (LACTIC ACID): LACTIC ACID, VENOUS: 0.79 mmol/L (ref 0.5–1.9)

## 2017-06-21 MED ORDER — LEVOFLOXACIN 750 MG PO TABS: 750.0000 mg | ORAL_TABLET | Freq: Every day | ORAL | 0 refills | Status: DC

## 2017-06-21 MED ORDER — ALBUTEROL SULFATE (2.5 MG/3ML) 0.083% IN NEBU
5.0000 mg | INHALATION_SOLUTION | Freq: Once | RESPIRATORY_TRACT | Status: AC
  Administered 2017-06-21: 5 mg via RESPIRATORY_TRACT
  Filled 2017-06-21: qty 6

## 2017-06-21 MED ORDER — SODIUM CHLORIDE 0.9 % IV SOLN
1000.0000 mL | INTRAVENOUS | Status: DC
  Administered 2017-06-21: 1000 mL via INTRAVENOUS

## 2017-06-21 MED ORDER — ALBUTEROL SULFATE HFA 108 (90 BASE) MCG/ACT IN AERS: 2.0000 | INHALATION_SPRAY | RESPIRATORY_TRACT | 3 refills | Status: DC | PRN

## 2017-06-21 MED ORDER — AEROCHAMBER PLUS W/MASK MISC: 2 refills | Status: DC

## 2017-06-21 MED ORDER — SODIUM CHLORIDE 0.9 % IV BOLUS (SEPSIS)
1000.0000 mL | Freq: Once | INTRAVENOUS | Status: AC
  Administered 2017-06-21: 1000 mL via INTRAVENOUS

## 2017-06-21 MED ORDER — LEVOFLOXACIN IN D5W 750 MG/150ML IV SOLN
750.0000 mg | Freq: Once | INTRAVENOUS | Status: AC
  Administered 2017-06-21: 750 mg via INTRAVENOUS
  Filled 2017-06-21: qty 150

## 2017-06-21 MED ORDER — PROMETHAZINE-DM 6.25-15 MG/5ML PO SYRP: 5.0000 mL | ORAL_SOLUTION | Freq: Four times a day (QID) | ORAL | 0 refills | Status: DC | PRN

## 2017-06-21 NOTE — Discharge Instructions (Signed)
1. Medications: Levaquin, Albuterol, phenergan DM, usual home medications 2. Treatment: rest, drink plenty of fluids,  3. Follow Up: Please followup with your primary doctor in 7 days for discussion of your diagnoses and further evaluation after today's visit; if you do not have a primary care doctor use the resource guide provided to find one; Please return to the ER for worsening fever, vomiting, difficulty breathing or other concerns

## 2017-06-21 NOTE — ED Provider Notes (Signed)
[facility]-EMERGENCY DEPT Provider Note   CSN: [id number] Arrival date & time: 06/20/17  2109     History   Chief Complaint Chief Complaint  Patient presents with  . Cough  . Fever    HPI Cynthia Best is a 61 y.o. female with a hx of everyday smoker (3/4 ppd) presents to the Emergency Department complaining of gradual, persistent, progressively worsening cough onset 10 days ago. Associated symptoms include productive cough, fever (102.2 at home), chills, SOB, chest tightness, fatigue.  Pt reports taking Acetaminophen with improvement in fever.  Nothing seems to make the symptoms better.  No treatment for the cough.  Activity makes her cough worse.  Pt denies CP, hemoptysis, N/V/D.     The history is provided by the patient and medical records. No language interpreter was used.    Past Medical History:  Diagnosis Date  . Arthritis   . Depression   . Herpes   . Scoliosis   . Spondylolysis of cervical region     Patient Active Problem List   Diagnosis Date Noted  . Actinic keratoses 01/24/2017  . Vitamin D deficiency 12/19/2015  . Family history of heart disease in female family member before age 51 12/15/2015  . Genital herpes 12/15/2015  . Nail problem 12/15/2015  . Obstructive chronic bronchitis without exacerbation (HCC) 06/14/2015  . Lung nodules 04/11/2015  . Systolic ejection murmur 04/11/2015  . S/P hysterectomy 04/04/2015  . Restless leg syndrome 03/28/2015  . Tobacco abuse in remission 03/07/2015  . Depression 03/07/2015  . Insomnia 03/07/2015    Past Surgical History:  Procedure Laterality Date  . ABDOMINAL HYSTERECTOMY  01/14/2006  . APPENDECTOMY  01/14/2005  . Myoectomy    . PLACEMENT OF BREAST IMPLANTS    . VAGINAL PROLAPSE REPAIR  04/14/2006    OB History    No data available       Home Medications    Prior to Admission medications   Medication Sig Start Date End Date Taking? Authorizing Provider  amphetamine-dextroamphetamine (ADDERALL) 20 MG  tablet Take 20 mg by mouth 2 (two) times daily.   Yes [provider]  FLUoxetine (PROZAC) 20 MG capsule Take 20 mg by mouth daily.   Yes [provider]  FLUoxetine (PROZAC) 40 MG capsule Take 40 mg by mouth daily.   Yes [provider]  QUEtiapine (SEROQUEL XR) 200 MG 24 hr tablet Take 100 mg by mouth at bedtime.   Yes [provider]  QUEtiapine (SEROQUEL) 100 MG tablet Take 50 mg by mouth at bedtime. Take 1 and 1/2    Yes [provider]  rOPINIRole (REQUIP) 1 MG tablet Take 2 tablets (2 mg total) by mouth at bedtime. 01/24/17  Yes Funches, Josalyn, MD  albuterol (PROVENTIL HFA;VENTOLIN HFA) 108 (90 Base) MCG/ACT inhaler Inhale 2 puffs into the lungs every 4 (four) hours as needed for wheezing or shortness of breath. 06/21/17   Marcea Rojek, Dahlia Client, PA-C  levofloxacin (LEVAQUIN) 750 MG tablet Take 1 tablet (750 mg total) by mouth daily. X 7 days 06/21/17   Alfonza Toft, Dahlia Client, PA-C  promethazine-dextromethorphan (PROMETHAZINE-DM) 6.25-15 MG/5ML syrup Take 5 mLs by mouth 4 (four) times daily as needed for cough. 06/21/17   Johncarlo Maalouf, Dahlia Client, PA-C  Spacer/Aero-Holding Chambers (AEROCHAMBER PLUS WITH MASK) inhaler Use as instructed 06/21/17   Erven Ramson, Dahlia Client, PA-C    Family History Family History  Problem Relation Age of Onset  . Stroke Mother   . Heart disease Father   . Hyperlipidemia Brother   .  Diabetes Maternal Grandmother     Social History Social History  Substance Use Topics  . Smoking status: Former Smoker    Packs/day: 1.00    Years: 17.00    Types: Cigarettes  . Smokeless tobacco: Former NeurosurgeonUser    Quit date: 03/07/2015  . Alcohol use 0.0 oz/week     Comment: 2 glass wine daily     Allergies   Thimerosal and Sulfa antibiotics   Review of Systems Review of Systems  Constitutional: Positive for fatigue and fever.  HENT: Negative for congestion, rhinorrhea and sneezing.   Eyes: Negative for visual disturbance.    Respiratory: Positive for cough, chest tightness and shortness of breath.   Gastrointestinal: Negative for abdominal pain.  Endocrine: Negative for polydipsia, polyphagia and polyuria.  Genitourinary: Negative for dysuria.  Musculoskeletal: Positive for back pain ( low, mild ).  Skin: Negative for rash.  Neurological: Negative for headaches.  Hematological: Negative for adenopathy.  Psychiatric/Behavioral: The patient is not nervous/anxious.   All other systems reviewed and are negative.    Physical Exam Updated Vital Signs BP 122/77 (BP Location: Left Arm)   Pulse 88   Temp 98.4 F (36.9 C) (Oral)   Resp 18   SpO2 99%   Physical Exam  Constitutional: She appears well-developed and well-nourished. No distress.  Awake, alert, nontoxic appearance  HENT:  Head: Normocephalic and atraumatic.  Mouth/Throat: Oropharynx is clear and moist. No oropharyngeal exudate.  Eyes: Conjunctivae are normal. No scleral icterus.  Neck: Normal range of motion. Neck supple.  Cardiovascular: Regular rhythm and intact distal pulses.  Tachycardia present.   Pulses:      Radial pulses are 2+ on the right side, and 2+ on the left side.  Pulmonary/Chest: Effort normal and breath sounds normal. No respiratory distress. She has no wheezes.  Equal chest expansion Coarse breath sounds throughout  Abdominal: Soft. Bowel sounds are normal. She exhibits no mass. There is no tenderness. There is no rebound and no guarding.  Musculoskeletal: Normal range of motion. She exhibits no edema.  Neurological: She is alert.  Speech is clear and goal oriented Moves extremities without ataxia  Skin: Skin is warm and dry. She is not diaphoretic.  Psychiatric: She has a normal mood and affect.  Nursing note and vitals reviewed.    ED Treatments / Results  Labs (all labs ordered are listed, but only abnormal results are displayed) Labs Reviewed  CBC - Abnormal; Notable for the following:       Result Value    WBC 13.3 (*)    All other components within normal limits  BASIC METABOLIC PANEL - Abnormal; Notable for the following:    Chloride 100 (*)    Glucose, Bld 115 (*)    All other components within normal limits  I-STAT CG4 LACTIC ACID, ED  CG4 I-STAT (LACTIC ACID)  I-STAT CG4 LACTIC ACID, ED    Radiology Dg Chest 2 View  Result Date: 06/20/2017 CLINICAL DATA:  Cough and fever for 10 days.  History of pneumonia. EXAM: CHEST  2 VIEW COMPARISON:  CT chest June 12, 2015 FINDINGS: Cardiomediastinal silhouette is normal. Faint RIGHT perihilar airspace opacity. Increased lung volumes without pleural effusion. No pneumothorax. Bilateral breast implants. Mild degenerative change of the thoracic spine, dextroscoliosis. IMPRESSION: RIGHT perihilar atelectasis versus pneumonia. Followup PA and lateral chest X-ray is recommended in 3-4 weeks following trial of antibiotic therapy to ensure resolution and exclude underlying malignancy. Electronically Signed   By: Michel Santeeourtnay  Bloomer M.D.  On: 06/20/2017 21:47    Procedures Procedures (including critical care time)  Angiocath insertion Performed by: Dierdre Forth  Consent: Verbal consent obtained. Risks and benefits: risks, benefits and alternatives were discussed Time out: Immediately prior to procedure a "time out" was called to verify the correct patient, procedure, equipment, support staff and site/side marked as required.  Preparation: Patient was prepped and draped in the usual sterile fashion.  Vein Location: Left basillic  Gauge: 16XW  Normal blood return and flush without difficulty Patient tolerance: Patient tolerated the procedure well with no immediate complications.     Medications Ordered in ED Medications  sodium chloride 0.9 % bolus 1,000 mL (not administered)    Followed by  0.9 %  sodium chloride infusion (not administered)  levofloxacin (LEVAQUIN) IVPB 750 mg (not administered)  acetaminophen (TYLENOL) tablet 650  mg (650 mg Oral Given 06/20/17 2207)  albuterol (PROVENTIL) (2.5 MG/3ML) 0.083% nebulizer solution 5 mg (5 mg Nebulization Given 06/21/17 0222)     Initial Impression / Assessment and Plan / ED Course  I have reviewed the triage vital signs and the nursing notes.  Pertinent labs & imaging results that were available during my care of the patient were reviewed by me and considered in my medical decision making (see chart for details).  Clinical Course as of Jun 21 321  Sat Jun 21, 2017  0317 Patient with improved lung sounds after albuterol. Now with focal rhonchi in the right middle lobe.  [HM]    Clinical Course User Index [HM] Xaiver Roskelley, Dahlia Client, PA-C    Pt presents with fever, productive cough.  CXR with evidence of PNA.  Pt is not HCAP.  Patient has been diagnosed with CAP via chest xray. Pt is not ill appearing, immunocompromised, and does not have multiple co morbidities, therefore I feel like the they can be treated as an OP with abx therapy. No evidence of sepsis with normal lactic acid. Mild leukocytosis. No hypoxia in the ED.  Patient will be discharged home with antibiotic and albuterol. Pt has been advised to return to the ED if symptoms worsen or they do not improve. Pt verbalizes understanding and is agreeable with plan.   BP 129/74 (BP Location: Right Arm)   Pulse 80   Temp 98.3 F (36.8 C) (Oral)   Resp 20   SpO2 100%     Final Clinical Impressions(s) / ED Diagnoses   Final diagnoses:  Community acquired pneumonia of right middle lobe of lung (HCC)  Fever, unspecified fever cause    New Prescriptions New Prescriptions   ALBUTEROL (PROVENTIL HFA;VENTOLIN HFA) 108 (90 BASE) MCG/ACT INHALER    Inhale 2 puffs into the lungs every 4 (four) hours as needed for wheezing or shortness of breath.   LEVOFLOXACIN (LEVAQUIN) 750 MG TABLET    Take 1 tablet (750 mg total) by mouth daily. X 7 days   PROMETHAZINE-DEXTROMETHORPHAN (PROMETHAZINE-DM) 6.25-15 MG/5ML SYRUP    Take  5 mLs by mouth 4 (four) times daily as needed for cough.   SPACER/AERO-HOLDING CHAMBERS (AEROCHAMBER PLUS WITH MASK) INHALER    Use as instructed     Milta Deiters 06/21/17 0322    Dione Booze, MD 06/21/17 [phone]

## 2017-07-31 ENCOUNTER — Telehealth: Payer: Self-pay | Admitting: Family Medicine

## 2017-07-31 DIAGNOSIS — G2581 Restless legs syndrome: Secondary | ICD-10-CM

## 2017-07-31 MED ORDER — ROPINIROLE HCL 1 MG PO TABS: 2.0000 mg | ORAL_TABLET | Freq: Every day | ORAL | 2 refills | Status: DC

## 2017-07-31 NOTE — Telephone Encounter (Signed)
requip refilled for 3 more months, patient advised to schedule f/u appointment before 3 months for additional refills

## 2017-07-31 NOTE — Telephone Encounter (Signed)
Pt. Called requesting a refill on rOPINIRole (REQUIP) 1 MG tablet Pt. [facility] [address]  Please f/u

## 2017-07-31 NOTE — Telephone Encounter (Signed)
Patient has not been seen since February, will forward request to Dr. Armen PickupFunches.

## 2017-07-31 NOTE — Telephone Encounter (Signed)
Pt was called and a VM was left informing pt of medication refill. 

## 2017-10-20 ENCOUNTER — Encounter: Payer: Self-pay | Admitting: Nurse Practitioner

## 2017-10-20 ENCOUNTER — Ambulatory Visit: Payer: BLUE CROSS/BLUE SHIELD | Attending: Nurse Practitioner | Admitting: Nurse Practitioner

## 2017-10-20 VITALS — BP 137/85 | HR 83 | Temp 98.6°F | Ht 69.0 in | Wt 141.8 lb

## 2017-10-20 DIAGNOSIS — L570 Actinic keratosis: Secondary | ICD-10-CM | POA: Insufficient documentation

## 2017-10-20 DIAGNOSIS — Z23 Encounter for immunization: Secondary | ICD-10-CM | POA: Diagnosis not present

## 2017-10-20 DIAGNOSIS — Z760 Encounter for issue of repeat prescription: Secondary | ICD-10-CM | POA: Insufficient documentation

## 2017-10-20 DIAGNOSIS — G2581 Restless legs syndrome: Secondary | ICD-10-CM | POA: Insufficient documentation

## 2017-10-20 DIAGNOSIS — Z716 Tobacco abuse counseling: Secondary | ICD-10-CM

## 2017-10-20 DIAGNOSIS — J449 Chronic obstructive pulmonary disease, unspecified: Secondary | ICD-10-CM | POA: Insufficient documentation

## 2017-10-20 DIAGNOSIS — F17210 Nicotine dependence, cigarettes, uncomplicated: Secondary | ICD-10-CM | POA: Insufficient documentation

## 2017-10-20 DIAGNOSIS — F329 Major depressive disorder, single episode, unspecified: Secondary | ICD-10-CM | POA: Insufficient documentation

## 2017-10-20 MED ORDER — ROPINIROLE HCL 1 MG PO TABS: 3.0000 mg | ORAL_TABLET | Freq: Every day | ORAL | 0 refills | Status: DC

## 2017-10-20 MED ORDER — ALBUTEROL SULFATE HFA 108 (90 BASE) MCG/ACT IN AERS: 2.0000 | INHALATION_SPRAY | RESPIRATORY_TRACT | 3 refills | Status: DC | PRN

## 2017-10-20 NOTE — Progress Notes (Addendum)
Subjective:    Patient ID: Cynthia Best, female    DOB: 05/08/1956, 61 y.o.   MRN: [id number]  HPI  Seen in office today for refill of Requip for restless leg syndrome. She has not been seen in this office since February 2018.  Restless leg syndrome:  This is a chronic condition for the patient.  She normally takes Requip 2 mg nightly.  However today she reports 2 mg has not sufficiently decreased her pain over the past few months.  She endorses increased leg pain lasting into the day.  At this time I will increase her Requip to 3 mg.  Tobacco Abuse: Cynthia Best had quit smoking many years ago and restarted at the age of 61.  Cynthia Best was counseled on the dangers of tobacco use, and was advised to quit. Reviewed strategies to maximize success, including removing cigarettes and smoking materials from environment, stress management and support of family/friends as well as pharmacological alternatives including: Wellbutrin, Chantix, Nicotine patch, Nicotine gum or lozenges. Smoking cessation support: smoking cessation hotline: 1-800-QUIT-NOW.  Smoking cessation classes are also available through [facility]. Call [phone] or visit our website at [email or web address].  Spent 4 minutes counseling on smoking cessation and patient is not ready to quit.  Actinic keratosis: This is a chronic condition for the patient.  At her last office visit with her previous PCP Cynthia Best was advised to follow-up in 2 weeks for cryotherapy.  However she was lost to follow-up.  At this time I will place dermatology referral for cryotherapy.   Obstructive chronic bronchitis: Patient continues to smoke.  She has mild wheezing on exam today here in the office.  She reports nonadherence with SABA. I will refill today. She states she does not know where her inhaler is.   HEALTH MAINTENANCE Influenza vaccine: Administered today. Mammogram: Patient declines referral today.  States she has to look at her  calendar.  Past Medical History:  Diagnosis Date  . Arthritis   . Depression   . Herpes   . Scoliosis   . Spondylolysis of cervical region    Past Surgical History:  Procedure Laterality Date  . ABDOMINAL HYSTERECTOMY  01/14/2006  . APPENDECTOMY  01/14/2005  . Myoectomy    . PLACEMENT OF BREAST IMPLANTS    . VAGINAL PROLAPSE REPAIR  04/14/2006    Social History   Social History  . Marital status: Legally Separated    Spouse name: N/A  . Number of children: N/A  . Years of education: N/A   Occupational History  . Not on file.   Social History Main Topics  . Smoking status: Former Smoker    Packs/day: 1.00    Years: 17.00    Types: Cigarettes  . Smokeless tobacco: Former NeurosurgeonUser    Quit date: 03/07/2015  . Alcohol use 0.0 oz/week     Comment: 2 glass wine daily  . Drug use: No  . Sexual activity: Not Currently   Other Topics Concern  . Not on file   Social History Narrative  . No narrative on file   Family History  Problem Relation Age of Onset  . Stroke Mother   . Heart disease Father   . Hyperlipidemia Brother   . Diabetes Maternal Grandmother       Review of Systems  Constitutional: Negative for activity change, appetite change, fatigue and fever.  HENT: Negative.   Eyes: Negative for visual disturbance.  Respiratory: Negative.  Negative for apnea, cough, choking, chest  tightness, shortness of breath, wheezing and stridor.   Cardiovascular: Negative for chest pain, palpitations and leg swelling.  Gastrointestinal: Negative for abdominal pain, constipation, diarrhea and vomiting.  Musculoskeletal: Negative.  Negative for neck pain and neck stiffness.  Skin: Positive for color change.       Skin lesions  Neurological: Negative for dizziness, syncope, weakness, light-headedness and headaches.  Psychiatric/Behavioral: Positive for confusion, decreased concentration, dysphoric mood and sleep disturbance. Negative for self-injury and suicidal ideas.         Objective:   Physical Exam  Constitutional: She is oriented to person, place, and time. She appears well-developed and well-nourished. She is cooperative.  HENT:  Head: Normocephalic and atraumatic.  Eyes: EOM are normal.  Neck: Normal range of motion.  Cardiovascular: Normal rate, regular rhythm, normal heart sounds and intact distal pulses.  Exam reveals no gallop and no friction rub.   No murmur heard. Pulmonary/Chest: Effort normal. No tachypnea. No respiratory distress. She has no decreased breath sounds. She has wheezes in the right upper field, the right middle field, the left upper field and the left middle field. She has no rhonchi. She has no rales. She exhibits no tenderness.  Abdominal: Soft. Bowel sounds are normal.  Musculoskeletal: Normal range of motion. She exhibits no edema.  Neurological: She is alert and oriented to person, place, and time. Coordination normal.  Skin: Skin is warm and dry.     Psychiatric: Her behavior is normal. Judgment and thought content normal. Her mood appears anxious. Her speech is rapid and/or pressured. Cognition and memory are normal.  Nursing note and vitals reviewed.    Vitals:   10/20/17 0846  BP: 137/85  Pulse: 83  Temp: 98.6 F (37 C)  TempSrc: Oral  SpO2: 99%  Weight: 141 lb 12.8 oz (64.3 kg)  Height: 5\' 9"  (1.753 m)    Assessment & Plan:  Cynthia Best was seen today for establish care and medication refill.  Diagnoses and all orders for this visit:  Restless leg syndrome -     Discontinue: rOPINIRole (REQUIP) 1 MG tablet; Take 3 tablets (3 mg total) by mouth at bedtime. -     rOPINIRole (REQUIP) 1 MG tablet; Take 3 tablets (3 mg total) by mouth at bedtime.  Encounter for smoking cessation counseling Cynthia Best was counseled on the dangers of tobacco use, and was advised to quit. Reviewed strategies to maximize success, including removing cigarettes and smoking materials from environment, stress management and support of  family/friends as well as pharmacological alternatives including: Wellbutrin, Chantix, Nicotine patch, Nicotine gum or lozenges. Smoking cessation support: smoking cessation hotline: 1-800-QUIT-NOW.  Smoking cessation classes are also available through [facility] and [facility]. Call [phone] or visit our website at [email or web address].   Spent 4 minutes counseling on smoking cessation and patient is not ready to quit.   Actinic keratosis -     Ambulatory referral to Dermatology  Chronic obstructive bronchitis (HCC) -     Discontinue: albuterol (PROVENTIL HFA;VENTOLIN HFA) 108 (90 Base) MCG/ACT inhaler; Inhale 2 puffs into the lungs every 4 (four) hours as needed for wheezing or shortness of breath. -     Discontinue: albuterol (PROVENTIL HFA;VENTOLIN HFA) 108 (90 Base) MCG/ACT inhaler; Inhale 2 puffs into the lungs every 4 (four) hours as needed for wheezing or shortness of breath. -     albuterol (PROVENTIL HFA;VENTOLIN HFA) 108 (90 Base) MCG/ACT inhaler; Inhale 2 puffs into the lungs every 4 (four) hours as needed for wheezing or shortness  of breath.  Need for influenza vaccination -     Flu Vaccine QUAD 36+ mos IM

## 2017-10-20 NOTE — Patient Instructions (Signed)
Actinic Keratosis An actinic keratosis is a precancerous growth on the skin. This means that it could develop into skin cancer if it is not treated. About 1% of these growths (actinic keratoses) turn into skin cancer within one year if they are not treated. It is important to have all of these growths evaluated to determine the best treatment approach. What are the causes? This condition is caused by getting too much ultraviolet (UV) radiation from the sun or other UV light sources. What increases the risk? The following factors may make you more likely to develop this condition:  Having light-colored skin and blue eyes.  Having blonde or red hair.  Spending a lot of time in the sun.  Inadequate skin protection when outdoors. This may include: ? Not using sunscreen properly. ? Not covering up skin that is exposed to sunlight.  Aging. The risk of developing an actinic keratosis increases with age.  What are the signs or symptoms? Actinic keratoses look like scaly, rough spots of skin.They can be as small as a pinhead or as big as a quarter. They may itch, hurt, or feel sensitive. In most cases, the growths become red. In some cases, they may be skin-colored, light tan, dark tan, pink, or a combination of any of these colors. There may be a small piece of pink or gray skin (skin tag) growing from the actinic keratosis. In some cases, it may be easier to notice actinic keratoses by feeling them, rather than seeing them. Actinic keratoses appear most often on areas of skin that get a lot of sun exposure, including the scalp, face, ears, lips, upper back, forearms, and the backs of the hands. Sometimes, actinic keratoses disappear, but many reappear a few days to a few weeks later. How is this diagnosed? This condition is usually diagnosed with a physical exam. A tissue sample may be removed from the actinic keratosis and examined under a microscope (biopsy). How is this treated?  Treatment for  this condition may include:  Scraping off the actinic keratosis (curettage).  Freezing the actinic keratosis with liquid nitrogen (cryosurgery). This causes the growth to eventually fall off the skin.  Applying medicated creams or gels to destroy the cells in the growth.  Applying chemicals to the actinic keratosis to make the outer layers of skin peel off (chemical peel).  Photodynamic therapy. In this procedure, medicated cream is applied to the actinic keratosis. This cream increases your skin's sensitivity to light. Then, a strong light is aimed at the actinic keratosis to destroy cells in the growth.  Follow these instructions at home: Skin care  Apply cool, wet cloths (cool compresses) to the affected areas.  Do not scratch your skin.  Check your skin regularly for any growths, especially growths that: ? Start to itch or bleed. ? Change in size, shape, or color. Caring for the treated area  Keep the treated area clean and dry as told by your health care provider.  Do not apply any medicine, cream, or lotion to the treated area unless your health care provider tells you to do that.  Do not pick at blisters or try to break them open. This can cause infection and scarring.  If you have red or irritated skin after treatment, follow instructions from your health care provider about how to take care of the treated area. Make sure you: ? Wash your hands with soap and water before you change your bandage (dressing). If soap and water are not available, use   hand sanitizer. ? Change your dressing as told by your health care provider.  If you have red or irritated skin after treatment, check your treated area every day for signs of infection. Check for: ? Swelling, pain, or more redness. ? Fluid or blood. ? Warmth. ? Pus or a bad smell. General instructions  Take over-the-counter and prescription medicines only as told by your health care provider.  Return to your normal  activities as told by your health care provider. Ask your health care provider what activities are safe for you.  Do not use any tobacco products, such as cigarettes, chewing tobacco, and e-cigarettes. If you need help quitting, ask your health care provider.  Have a skin exam done every year by a health care provider who is a skin conditions specialist (dermatologist).  Keep all follow-up visits as told by your health care provider. This is important. How is this prevented?  Do not get sunburns.  Try to avoid the sun between 10:00 a.m. and 4:00 p.m. This is when the UV light is the strongest.  Use a sunscreen or sunblock with SPF 30 (sun protection factor 30) or greater.  Apply sunscreen before you are exposed to sunlight, and reapply periodically as often as directed by the instructions on the sunscreen container.  Always wear sunglasses that have UV protection, and always wear hats and clothing to protect your skin from sunlight.  When possible, avoid medicines that increase your sensitivity to sunlight. These include: ? Certain antibiotic medicines. ? Certain water pills (diuretics). ? Certain prescription medicines that are used to treat acne (retinoids).  Do not use tanning beds or other indoor tanning devices. Contact a health care provider if:  You notice any changes or new growths on your skin.  You have swelling, pain, or more redness around your treated area.  You have fluid or blood coming from your treated area.  Your treated area feels warm to the touch.  You have pus or a bad smell coming from your treated area.  You have a fever.  You have a blister that becomes large and painful. This information is not intended to replace advice given to you by your health care provider. Make sure you discuss any questions you have with your health care provider. Document Released: 03/07/2009 Document Revised: 08/09/2016 Document Reviewed: 08/19/2015 Elsevier Interactive  Patient Education  2018 [employer] Health Risks of Smoking Smoking cigarettes is very bad for your health. Tobacco smoke has over 200 known poisons in it. It contains the poisonous gases nitrogen oxide and carbon monoxide. There are over 60 chemicals in tobacco smoke that cause cancer. Smoking is difficult to quit because a chemical in tobacco, called nicotine, causes addiction or dependence. When you smoke and inhale, nicotine is absorbed rapidly into the bloodstream through your lungs. Both inhaled and non-inhaled nicotine may be addictive. What are the risks of cigarette smoke? Cigarette smokers have an increased risk of many serious medical problems, including:  Lung cancer.  Lung disease, such as pneumonia, bronchitis, and emphysema.  Chest pain (angina) and heart attack because the heart is not getting enough oxygen.  Heart disease and peripheral blood vessel disease.  High blood pressure (hypertension).  Stroke.  Oral cancer, including cancer of the lip, mouth, or voice box.  Bladder cancer.  Pancreatic cancer.  Cervical cancer.  Pregnancy complications, including premature birth.  Stillbirths and smaller newborn babies, birth defects, and genetic damage to sperm.  Early menopause.  Lower estrogen level for women.  Infertility.  Facial wrinkles.  Blindness.  Increased risk of broken bones (fractures).  Senile dementia.  Stomach ulcers and internal bleeding.  Delayed wound healing and increased risk of complications during surgery.  Even smoking lightly shortens your life expectancy by several years.  Because of secondhand smoke exposure, children of smokers have an increased risk of the following:  Sudden infant death syndrome (SIDS).  Respiratory infections.  Lung cancer.  Heart disease.  Ear infections.  What are the benefits of quitting? There are many health benefits of quitting smoking. Here are some of them:  Within days of quitting  smoking, your risk of having a heart attack decreases, your blood flow improves, and your lung capacity improves. Blood pressure, pulse rate, and breathing patterns start returning to normal soon after quitting.  Within months, your lungs may clear up completely.  Quitting for 10 years reduces your risk of developing lung cancer and heart disease to almost that of a nonsmoker.  People who quit may see an improvement in their overall quality of life.  How do I quit smoking? Smoking is an addiction with both physical and psychological effects, and longtime habits can be hard to change. Your health care provider can recommend:  Programs and community resources, which may include group support, education, or talk therapy.  Prescription medicines to help reduce cravings.  Nicotine replacement products, such as patches, gum, and nasal sprays. Use these products only as directed. Do not replace cigarette smoking with electronic cigarettes, which are commonly called e-cigarettes. The safety of e-cigarettes is not known, and some may contain harmful chemicals.  A combination of two or more of these methods.  Where to find more information:  American Lung Association: www.lung.org  American Cancer Society: www.cancer.org Summary  Smoking cigarettes is very bad for your health. Cigarette smokers have an increased risk of many serious medical problems, including several cancers, heart disease, and stroke.  Smoking is an addiction with both physical and psychological effects, and longtime habits can be hard to change.  By stopping right away, you can greatly reduce the risk of medical problems for you and your family.  To help you quit smoking, your health care provider can recommend programs, community resources, prescription medicines, and nicotine replacement products such as patches, gum, and nasal sprays. This information is not intended to replace advice given to you by your health care  provider. Make sure you discuss any questions you have with your health care provider. Document Released: 01/16/2005 Document Revised: 12/13/2016 Document Reviewed: 12/13/2016 Elsevier Interactive Patient Education  2017 [employer].

## 2017-11-11 ENCOUNTER — Other Ambulatory Visit (HOSPITAL_COMMUNITY)
Admission: RE | Admit: 2017-11-11 | Discharge: 2017-11-11 | Disposition: A | Discharge status: Home / Self Care | Payer: BLUE CROSS/BLUE SHIELD | Source: Ambulatory Visit | Attending: Family Medicine | Admitting: Family Medicine

## 2017-11-11 ENCOUNTER — Ambulatory Visit: Payer: BLUE CROSS/BLUE SHIELD | Attending: Nurse Practitioner | Admitting: Family Medicine

## 2017-11-11 VITALS — BP 121/79 | HR 89 | Temp 99.2°F | Resp 16 | Ht 69.0 in | Wt 141.8 lb

## 2017-11-11 DIAGNOSIS — B850 Pediculosis due to Pediculus humanus capitis: Secondary | ICD-10-CM | POA: Diagnosis not present

## 2017-11-11 DIAGNOSIS — N898 Other specified noninflammatory disorders of vagina: Secondary | ICD-10-CM

## 2017-11-11 DIAGNOSIS — Z79899 Other long term (current) drug therapy: Secondary | ICD-10-CM | POA: Diagnosis not present

## 2017-11-11 DIAGNOSIS — Z113 Encounter for screening for infections with a predominantly sexual mode of transmission: Secondary | ICD-10-CM | POA: Diagnosis not present

## 2017-11-11 DIAGNOSIS — L300 Nummular dermatitis: Secondary | ICD-10-CM | POA: Diagnosis not present

## 2017-11-11 DIAGNOSIS — L309 Dermatitis, unspecified: Secondary | ICD-10-CM | POA: Insufficient documentation

## 2017-11-11 LAB — POCT URINALYSIS DIPSTICK
Bilirubin, UA: NEGATIVE
GLUCOSE UA: NEGATIVE
Ketones, UA: NEGATIVE
LEUKOCYTES UA: NEGATIVE
NITRITE UA: NEGATIVE
Protein, UA: NEGATIVE
SPEC GRAV UA: 1.015 (ref 1.010–1.025)
UROBILINOGEN UA: 0.2 U/dL
pH, UA: 5.5 (ref 5.0–8.0)

## 2017-11-11 MED ORDER — HYDROCORTISONE 2.5 % EX CREA: TOPICAL_CREAM | Freq: Two times a day (BID) | CUTANEOUS | 0 refills | Status: DC

## 2017-11-11 MED ORDER — PERMETHRIN 1 % EX LOTN: 1.0000 "application " | TOPICAL_LOTION | Freq: Once | CUTANEOUS | 0 refills | Status: AC

## 2017-11-11 MED ORDER — PERMETHRIN 1 % EX LOTN: 1.0000 "application " | TOPICAL_LOTION | Freq: Once | CUTANEOUS | 0 refills | Status: DC

## 2017-11-11 NOTE — Patient Instructions (Signed)
Vaginitis Vaginitis is a condition in which the vaginal tissue swells and becomes red (inflamed). This condition is most often caused by a change in the normal balance of bacteria and yeast that live in the vagina. This change causes an overgrowth of certain bacteria or yeast, which causes the inflammation. There are different types of vaginitis, but the most common types are:  Bacterial vaginosis.  Yeast infection (candidiasis).  Trichomoniasis vaginitis. This is a sexually transmitted disease (STD).  Viral vaginitis.  Atrophic vaginitis.  Allergic vaginitis.  What are the causes? The cause of this condition depends on the type of vaginitis. It can be caused by:  Bacteria (bacterial vaginosis).  Yeast, which is a fungus (yeast infection).  A parasite (trichomoniasis vaginitis).  A virus (viral vaginitis).  Low hormone levels (atrophic vaginitis). Low hormone levels can occur during pregnancy, breastfeeding, or after menopause.  Irritants, such as bubble baths, scented tampons, and feminine sprays (allergic vaginitis).  Other factors can change the normal balance of the yeast and bacteria that live in the vagina. These include:  Antibiotic medicines.  Poor hygiene.  Diaphragms, vaginal sponges, spermicides, birth control pills, and intrauterine devices (IUD).  Sex.  Infection.  Uncontrolled diabetes.  A weakened defense (immune) system.  What increases the risk? This condition is more likely to develop in women who:  Smoke.  Use vaginal douches, scented tampons, or scented sanitary pads.  Wear tight-fitting pants.  Wear thong underwear.  Use oral birth control pills or an IUD.  Have sex without a condom.  Have multiple sex partners.  Have an STD.  Frequently use the spermicide nonoxynol-9.  Eat lots of foods high in sugar.  Have uncontrolled diabetes.  Have low estrogen levels.  Have a weakened immune system from an immune disorder or medical  treatment.  Are pregnant or breastfeeding.  What are the signs or symptoms? Symptoms vary depending on the cause of the vaginitis. Common symptoms include:  Abnormal vaginal discharge. ? The discharge is white, gray, or yellow with bacterial vaginosis. ? The discharge is thick, white, and cheesy with a yeast infection. ? The discharge is frothy and yellow or greenish with trichomoniasis.  A bad vaginal smell. The smell is fishy with bacterial vaginosis.  Vaginal itching, pain, or swelling.  Sex that is painful.  Pain or burning when urinating.  Sometimes there are no symptoms. How is this diagnosed? This condition is diagnosed based on your symptoms and medical history. A physical exam, including a pelvic exam, will also be done. You may also have other tests, including:  Tests to determine the pH level (acidity or alkalinity) of your vagina.  A whiff test, to assess the odor that results when a sample of your vaginal discharge is mixed with a potassium hydroxide solution.  Tests of vaginal fluid. A sample will be examined under a microscope.  How is this treated? Treatment varies depending on the type of vaginitis you have. Your treatment may include:  Antibiotic creams or pills to treat bacterial vaginosis and trichomoniasis.  Antifungal medicines, such as vaginal creams or suppositories, to treat a yeast infection.  Medicine to ease discomfort if you have viral vaginitis. Your sexual partner should also be treated.  Estrogen delivered in a cream, pill, suppository, or vaginal ring to treat atrophic vaginitis. If vaginal dryness occurs, lubricants and moisturizing creams may help. You may need to avoid scented soaps, sprays, or douches.  Stopping use of a product that is causing allergic vaginitis. Then using a vaginal  cream to treat the symptoms.  Follow these instructions at home: Lifestyle  Keep your genital area clean and dry. Avoid soap, and only rinse the area  with water.  Do not douche or use tampons until your health care provider says it is okay to do so. Use sanitary pads, if needed.  Do not have sex until your health care provider approves. When you can return to sex, practice safe sex and use condoms.  Wipe from front to back. This avoids the spread of bacteria from the rectum to the vagina. General instructions  Take over-the-counter and prescription medicines only as told by your health care provider.  If you were prescribed an antibiotic medicine, take or use it as told by your health care provider. Do not stop taking or using the antibiotic even if you start to feel better.  Keep all follow-up visits as told by your health care provider. This is important. How is this prevented?  Use mild, non-scented products. Do not use things that can irritate the vagina, such as fabric softeners. Avoid the following products if they are scented: ? Feminine sprays. ? Detergents. ? Tampons. ? Feminine hygiene products. ? Soaps or bubble baths.  Let air reach your genital area. ? Wear cotton underwear to reduce moisture buildup. ? Avoid wearing underwear while you sleep. ? Avoid wearing tight pants and underwear or nylons without a cotton panel. ? Avoid wearing thong underwear.  Take off any wet clothing, such as bathing suits, as soon as possible.  Practice safe sex and use condoms. Contact a health care provider if:  You have abdominal pain.  You have a fever.  You have symptoms that last for more than 2-3 days. Get help right away if:  You have a fever and your symptoms suddenly get worse. Summary  Vaginitis is a condition in which the vaginal tissue becomes inflamed.This condition is most often caused by a change in the normal balance of bacteria and yeast that live in the vagina.  Treatment varies depending on the type of vaginitis you have.  Do not douche, use tampons , or have sex until your health care provider approves.  When you can return to sex, practice safe sex and use condoms. This information is not intended to replace advice given to you by your health care provider. Make sure you discuss any questions you have with your health care provider. Document Released: 10/06/2007 Document Revised: 01/14/2017 Document Reviewed: 01/14/2017 Elsevier Interactive Patient Education  2018 [employer].   Lice, Adult Lice are tiny insects with claws on the ends of their legs. They are small parasites that live on the human body. A parasite is an insect that lives off another animal and cannot survive without it. Lice often make their home in a person's hair, such as hair on the head or in the pubic area. Pubic lice are sometimes referred to as crabs. Lice hatch from little round eggs, which are attached to the base of hairs. Lice eggs are also called nits. Lice can spread from one person to another. Lice crawl. They do not fly or jump. Lice cause skin irritation and itching in the area of the infested hair. Although having lice can be annoying, it is not dangerous. Lice do not spread diseases. Treatment will usually clear up the symptoms within a few days. What are the causes? This condition may be caused by: Having very close contact with an infested person. Sharing infested items that touch your skin and  hair. These include personal items, such as hats, combs, brushes, towels, clothing, pillowcases, or sheets.  Pubic lice are spread through sexual contact. What increases the risk? Although having lice is more common among young children, anyone can get lice. Lice tend to thrive in warm weather, so that type of weather increases the risk. What are the signs or symptoms? Symptoms of this condition include: Itchiness in the affected area. Skin irritation. Feeling of something moving in the hair. Rash or sores on the skin. Tiny flakes or sacs near the scalp. These may be white, yellow, or tan. Tiny bugs crawling on  the hair or scalp.  How is this diagnosed? This condition is diagnosed based on: Your symptoms. A physical exam. Your health care provider will examine the affected area closely for live lice, tiny eggs (nits), and empty egg cases. Eggs are typically yellow or tan in color. Empty egg cases are whitish. Lice are gray or brown.  How is this treated? Treatment for this condition includes: Using a hair rinse that contains a mild insecticide to kill lice. Your health care provider will recommend a prescription or over-the-counter rinse. Removing lice, eggs, and egg cases by using a comb or tweezers. Washing and bagging your clothing and bedding.  Pregnant women should not use medicated shampoo or cream without first talking to their health care provider. Follow these instructions at home: Using medicated rinse Apply medicated rinse as told by your health care provider. Follow the label instructions carefully. General instructions for applying rinses may include these steps: Put on an old shirt or underwear, or use an old towel in case of staining from the rinse. Wash and towel-dry your head or pubic area before applying the rinse if directed to do so. When your hair is dry, apply the rinse. Leave the rinse in your hair for the amount of time specified in the instructions. Rinse the area with water. Comb your wet hair with a fine-tooth comb. Comb it close to the skin and down to the ends, removing any lice, eggs, or egg cases. A lice comb may be included with the medicated rinse. Do not wash the infested hair for 2 days while the medicine kills the lice. After the treatment, repeat combing out your hair and removing lice, eggs, or egg cases from the hair every 2-3 days. Do this for about 2-3 weeks. After treatment, the remaining lice should be moving more slowly. Repeat the treatment if necessary in 7-10 days.  General instructions Remove any remaining lice, eggs, or egg cases using a  fine-tooth comb. Use hot water to wash all towels, hats, scarves, jackets, bedding, and clothing that you have recently used. Put any non-washable items that may have been exposed into plastic bags. Keep the bags closed for 2 weeks. Soak all combs and brushes in hot water for 10 minutes. Vacuum furniture to remove any loose hair. There is no need to use chemicals, which can be poisonous (toxic). Lice survive for only 1-2 days away from human skin. Eggs may survive for only 1 week. For pubic lice, tell any sexual partners to seek treatment. For head lice, ask your health care provider if other family members or close contacts should be examined or treated as well. Keep all follow-up visits as told by your health care provider. This is important. Contact a health care provider if: You develop sores that look infected. Your rash or sores do not go away in 1 week. The lice or eggs return or  do not go away in spite of treatment. Summary Lice are tiny parasitic insects that live on the human body. A parasite is an insect that lives off another animal and cannot survive without it. Lice can spread from one person to another through close contact with an infested person or by sharing personal items, such as combs, brushes, or hats. Lice can be treated with a medicated rinse. Follow your health care provider's instructions, or instructions on the label, if you are being treated with this medicine. Ask your health care provider if your family members or close contacts should be treated for lice. This information is not intended to replace advice given to you by your health care provider. Make sure you discuss any questions you have with your health care provider. Document Released: 12/09/2005 Document Revised: 12/18/2016 Document Reviewed: 12/18/2016 Elsevier Interactive Patient Education  2017 [employer]

## 2017-11-11 NOTE — Progress Notes (Signed)
Subjective:  Patient ID: Cynthia Best, female    DOB: 05/24/1956  Age: 61 y.o. MRN: [id number]  CC: Vaginal Discharge   HPI Cynthia OfficerRonda L Ballard presents for complaints of vaginal discharge, pruritic scalp, and skin lesions.  Vaginal discharge: Onset 1 week ago.  Symptoms include vaginal itching, and discharge described as yellow-gray and large in amount.  She denies any dysuria or vaginal lesions.  She reports 1 sexual partner within the last 3 months.  Scalp itching: Onset less than a week ago.  She reports using " head and shoulders" over-the-counter for symptoms.  She denies any scalp flaking.  She does report small black flakes underneath her fingernails after scratching her scalp.  Skin lesions: Onset 8 months ago.  Reocurring.  Symptoms include very mild pruritus and redness.  Location right lower extremity, posterior back, and arms.  She denies taking anything for symptoms.     Outpatient Medications Prior to Visit  Medication Sig Dispense Refill  . amphetamine-dextroamphetamine (ADDERALL) 20 MG tablet Take 20 mg by mouth 2 (two) times daily.    Marland Kitchen. FLUoxetine (PROZAC) 20 MG capsule Take 20 mg by mouth daily.    Marland Kitchen. FLUoxetine (PROZAC) 40 MG capsule Take 40 mg by mouth daily.    . QUEtiapine (SEROQUEL) 100 MG tablet Take 50 mg by mouth at bedtime. Take 1 and 1/2     . rOPINIRole (REQUIP) 1 MG tablet Take 3 tablets (3 mg total) by mouth at bedtime. 180 tablet 0  . albuterol (PROVENTIL HFA;VENTOLIN HFA) 108 (90 Base) MCG/ACT inhaler Inhale 2 puffs into the lungs every 4 (four) hours as needed for wheezing or shortness of breath. (Patient not taking: Reported on 11/11/2017) 1 Inhaler 3  . QUEtiapine (SEROQUEL XR) 200 MG 24 hr tablet Take 100 mg by mouth at bedtime.    Marland Kitchen. Spacer/Aero-Holding Chambers (AEROCHAMBER PLUS WITH MASK) inhaler Use as instructed (Patient not taking: Reported on 10/20/2017) 1 each 2   No facility-administered medications prior to visit.     ROS Review of Systems    Constitutional: Negative.   Respiratory: Negative.   Cardiovascular: Negative.   Genitourinary: Positive for vaginal discharge.  Skin:       Skin lesions. Scalp pruritus.       Objective:  BP 121/79 (BP Location: Left Arm, Patient Position: Sitting, Cuff Size: Large)   Pulse 89   Temp 99.2 F (37.3 C) (Oral)   Resp 16   Ht 5\' 9"  (1.753 m)   Wt 141 lb 12.8 oz (64.3 kg)   SpO2 98%   BMI 20.94 kg/m   BP/Weight 11/11/2017 10/20/2017 06/21/2017  Systolic BP 121 137 125  Diastolic BP 79 85 76  Wt. (Lbs) 141.8 141.8 -  BMI 20.94 20.94 -     Physical Exam  Constitutional: She appears well-developed and well-nourished.  HENT:  Head: Normocephalic. Hair is abnormal (louse and nits ).  Cardiovascular: Normal rate, regular rhythm, normal heart sounds and intact distal pulses.  Pulmonary/Chest: Effort normal and breath sounds normal.  Abdominal: Soft. Bowel sounds are normal. There is no tenderness.  Genitourinary: Vaginal discharge found.  Skin: Skin is warm and dry. Lesion (round patches of dry skin to right lower leg, posterior upper buttock, and arms.) noted.  Nursing note and vitals reviewed.   Assessment & Plan:   1. Vaginal discharge  - POCT urinalysis dipstick - Cervicovaginal ancillary only  2. Pediculosis capitis  - permethrin (ELIMITE) 1 % lotion; Apply 1 application topically once for  1 dose. Shampoo, rinse and towel dry hair, saturate hair and scalp with permethrin. Rinse after 10 min; repeat in 1 week if needed  Dispense: 59 mL; Refill: 0  3. Nummular eczema  - Ambulatory referral to Dermatology - hydrocortisone 2.5 % cream; Apply topically 2 (two) times daily.  Dispense: 30 g; Refill: 0  4. Screening for STDs (sexually transmitted diseases)  - Cervicovaginal ancillary only - HEP, RPR, HIV Panel      Lizbeth BarkMandesia R Quantarius Genrich FNP

## 2017-11-11 NOTE — Progress Notes (Signed)
C/o vaginal irritation and discharge.  Pt seen by provider.

## 2017-11-12 LAB — HEP, RPR, HIV PANEL
HEP B S AG: NEGATIVE
HIV Screen 4th Generation wRfx: NONREACTIVE
RPR Ser Ql: NONREACTIVE

## 2017-11-14 LAB — CERVICOVAGINAL ANCILLARY ONLY
BACTERIAL VAGINITIS: POSITIVE — AB
CANDIDA VAGINITIS: NEGATIVE
CHLAMYDIA, DNA PROBE: NEGATIVE
Neisseria Gonorrhea: NEGATIVE
TRICH (WINDOWPATH): NEGATIVE

## 2017-11-17 ENCOUNTER — Other Ambulatory Visit: Payer: Self-pay | Admitting: Family Medicine

## 2017-11-17 DIAGNOSIS — N760 Acute vaginitis: Principal | ICD-10-CM

## 2017-11-17 DIAGNOSIS — B9689 Other specified bacterial agents as the cause of diseases classified elsewhere: Secondary | ICD-10-CM

## 2017-11-17 MED ORDER — METRONIDAZOLE 500 MG PO TABS: 500.0000 mg | ORAL_TABLET | Freq: Two times a day (BID) | ORAL | 0 refills | Status: DC

## 2017-11-18 ENCOUNTER — Telehealth: Payer: Self-pay

## 2017-11-18 NOTE — Telephone Encounter (Signed)
CMA call regarding results   Patient Verify DOB   Patient was aware and understood  

## 2017-11-18 NOTE — Progress Notes (Signed)
Spoke with patient & she is requesting a medication that doe snot interfere with alcohol

## 2017-11-18 NOTE — Telephone Encounter (Signed)
-----   Message from Lizbeth BarkMandesia R Hairston, FNP sent at 11/17/2017 12:22 PM EST ----- Bacterial vaginosis was positive. BV is caused by an overgrowth of germs in the vagina. You will be prescribed metronidazole to treat. To reduce your risk of developing BV don't douche, don't use scented soap or sprays, and use protection during sexual intercourse. HIV, Hepatitis B, and syphilis are all negative.  Gonorrhea, Chlamydia,, and Trichomonas were all negative.

## 2017-11-20 ENCOUNTER — Telehealth: Payer: Self-pay | Admitting: Nurse Practitioner

## 2017-11-20 NOTE — Telephone Encounter (Signed)
Pt. Called stating that she was prescribed metroNIDAZOLE (FLAGYL) 500 MG tablet  Pt. States that the medications does not help her and she would like to be prescribed a different Rx. [facility] [address]. Pt. States it's ok to LVM. Please f/u.

## 2017-11-21 ENCOUNTER — Other Ambulatory Visit: Payer: Self-pay | Admitting: Family Medicine

## 2017-11-21 DIAGNOSIS — B9689 Other specified bacterial agents as the cause of diseases classified elsewhere: Secondary | ICD-10-CM

## 2017-11-21 DIAGNOSIS — N760 Acute vaginitis: Principal | ICD-10-CM

## 2017-11-21 MED ORDER — CLINDAMYCIN HCL 300 MG PO CAPS: 300.0000 mg | ORAL_CAPSULE | Freq: Two times a day (BID) | ORAL | 0 refills | Status: DC

## 2017-11-21 NOTE — Telephone Encounter (Signed)
[  11/21/2017 3:42 PM] Mosquera, Mikle Boswortharlos:  i have Mrs Rana SnareLowe on the line she spoke with Mardene CelesteJoanna on Tuesday, she supposed to informed you  that   metroNIDAZOLE (FLAGYL) 500 MG tablet  is not help i have on the line she is asking for you to sent anothe prescription to the pharmacy for her mrn [id number] the [facility] [address]

## 2017-11-21 NOTE — Telephone Encounter (Signed)
Called and informed patient that new medication was be sent to pharmacy of her request. She communicates understanding.

## 2017-12-12 ENCOUNTER — Other Ambulatory Visit: Payer: Self-pay | Admitting: Pharmacist

## 2017-12-12 ENCOUNTER — Ambulatory Visit: Payer: BLUE CROSS/BLUE SHIELD | Attending: Nurse Practitioner | Admitting: Nurse Practitioner

## 2017-12-12 ENCOUNTER — Encounter (INDEPENDENT_AMBULATORY_CARE_PROVIDER_SITE_OTHER): Payer: Self-pay

## 2017-12-12 ENCOUNTER — Encounter: Payer: Self-pay | Admitting: Nurse Practitioner

## 2017-12-12 VITALS — BP 106/68 | HR 87 | Temp 99.3°F | Ht 69.0 in | Wt 144.0 lb

## 2017-12-12 DIAGNOSIS — Z79899 Other long term (current) drug therapy: Secondary | ICD-10-CM | POA: Insufficient documentation

## 2017-12-12 DIAGNOSIS — Z882 Allergy status to sulfonamides status: Secondary | ICD-10-CM | POA: Diagnosis not present

## 2017-12-12 DIAGNOSIS — Z9889 Other specified postprocedural states: Secondary | ICD-10-CM | POA: Insufficient documentation

## 2017-12-12 DIAGNOSIS — M419 Scoliosis, unspecified: Secondary | ICD-10-CM | POA: Diagnosis not present

## 2017-12-12 DIAGNOSIS — Z1231 Encounter for screening mammogram for malignant neoplasm of breast: Secondary | ICD-10-CM | POA: Diagnosis not present

## 2017-12-12 DIAGNOSIS — Z833 Family history of diabetes mellitus: Secondary | ICD-10-CM | POA: Diagnosis not present

## 2017-12-12 DIAGNOSIS — Z23 Encounter for immunization: Secondary | ICD-10-CM | POA: Diagnosis not present

## 2017-12-12 DIAGNOSIS — G2581 Restless legs syndrome: Secondary | ICD-10-CM | POA: Diagnosis not present

## 2017-12-12 DIAGNOSIS — Z90710 Acquired absence of both cervix and uterus: Secondary | ICD-10-CM | POA: Insufficient documentation

## 2017-12-12 DIAGNOSIS — F329 Major depressive disorder, single episode, unspecified: Secondary | ICD-10-CM | POA: Insufficient documentation

## 2017-12-12 DIAGNOSIS — Z8249 Family history of ischemic heart disease and other diseases of the circulatory system: Secondary | ICD-10-CM | POA: Diagnosis not present

## 2017-12-12 DIAGNOSIS — Z823 Family history of stroke: Secondary | ICD-10-CM | POA: Diagnosis not present

## 2017-12-12 DIAGNOSIS — Z01411 Encounter for gynecological examination (general) (routine) with abnormal findings: Secondary | ICD-10-CM | POA: Diagnosis not present

## 2017-12-12 DIAGNOSIS — M1990 Unspecified osteoarthritis, unspecified site: Secondary | ICD-10-CM | POA: Diagnosis not present

## 2017-12-12 DIAGNOSIS — Z888 Allergy status to other drugs, medicaments and biological substances status: Secondary | ICD-10-CM | POA: Diagnosis not present

## 2017-12-12 DIAGNOSIS — E559 Vitamin D deficiency, unspecified: Secondary | ICD-10-CM | POA: Insufficient documentation

## 2017-12-12 DIAGNOSIS — Z0001 Encounter for general adult medical examination with abnormal findings: Secondary | ICD-10-CM | POA: Diagnosis not present

## 2017-12-12 MED ORDER — ROPINIROLE HCL 1 MG PO TABS: 3.0000 mg | ORAL_TABLET | Freq: Every day | ORAL | 3 refills | Status: DC

## 2017-12-12 MED ORDER — ZOSTER VAC RECOMB ADJUVANTED 50 MCG/0.5ML IM SUSR: 0.5000 mL | Freq: Once | INTRAMUSCULAR | 0 refills | Status: DC

## 2017-12-12 MED ORDER — ZOSTER VAC RECOMB ADJUVANTED 50 MCG/0.5ML IM SUSR: 0.5000 mL | Freq: Once | INTRAMUSCULAR | 0 refills | Status: AC

## 2017-12-12 MED ORDER — TRIAMCINOLONE ACETONIDE 0.1 % EX CREA: 1.0000 "application " | TOPICAL_CREAM | Freq: Two times a day (BID) | CUTANEOUS | 1 refills | Status: DC

## 2017-12-12 MED FILL — SHINGRIX VIAL KIT: 50 | 1 days supply | Qty: 1 | Fill #0

## 2017-12-12 NOTE — Patient Instructions (Addendum)
Health Maintenance, Female Adopting a healthy lifestyle and getting preventive care can go a long way to promote health and wellness. Talk with your health care provider about what schedule of regular examinations is right for you. This is a good chance for you to check in with your provider about disease prevention and staying healthy. In between checkups, there are plenty of things you can do on your own. Experts have done a lot of research about which lifestyle changes and preventive measures are most likely to keep you healthy. Ask your health care provider for more information. Weight and diet Eat a healthy diet  Be sure to include plenty of vegetables, fruits, low-fat dairy products, and lean protein.  Do not eat a lot of foods high in solid fats, added sugars, or salt.  Get regular exercise. This is one of the most important things you can do for your health. ? Most adults should exercise for at least 150 minutes each week. The exercise should increase your heart rate and make you sweat (moderate-intensity exercise). ? Most adults should also do strengthening exercises at least twice a week. This is in addition to the moderate-intensity exercise.  Maintain a healthy weight  Body mass index (BMI) is a measurement that can be used to identify possible weight problems. It estimates body fat based on height and weight. Your health care provider can help determine your BMI and help you achieve or maintain a healthy weight.  For females 20 years of age and older: ? A BMI below 18.5 is considered underweight. ? A BMI of 18.5 to 24.9 is normal. ? A BMI of 25 to 29.9 is considered overweight. ? A BMI of 30 and above is considered obese.  Watch levels of cholesterol and blood lipids  You should start having your blood tested for lipids and cholesterol at 61 years of age, then have this test every 5 years.  You may need to have your cholesterol levels checked more often if: ? Your lipid or  cholesterol levels are high. ? You are older than 61 years of age. ? You are at high risk for heart disease.  Cancer screening Lung Cancer  Lung cancer screening is recommended for adults 55-80 years old who are at high risk for lung cancer because of a history of smoking.  A yearly low-dose CT scan of the lungs is recommended for people who: ? Currently smoke. ? Have quit within the past 15 years. ? Have at least a 30-pack-year history of smoking. A pack year is smoking an average of one pack of cigarettes a day for 1 year.  Yearly screening should continue until it has been 15 years since you quit.  Yearly screening should stop if you develop a health problem that would prevent you from having lung cancer treatment.  Breast Cancer  Practice breast self-awareness. This means understanding how your breasts normally appear and feel.  It also means doing regular breast self-exams. Let your health care provider know about any changes, no matter how small.  If you are in your 20s or 30s, you should have a clinical breast exam (CBE) by a health care provider every 1-3 years as part of a regular health exam.  If you are 40 or older, have a CBE every year. Also consider having a breast X-ray (mammogram) every year.  If you have a family history of breast cancer, talk to your health care provider about genetic screening.  If you are at high risk   for breast cancer, talk to your health care provider about having an MRI and a mammogram every year.  Breast cancer gene (BRCA) assessment is recommended for women who have family members with BRCA-related cancers. BRCA-related cancers include: ? Breast. ? Ovarian. ? Tubal. ? Peritoneal cancers.  Results of the assessment will determine the need for genetic counseling and BRCA1 and BRCA2 testing.  Cervical Cancer Your health care provider may recommend that you be screened regularly for cancer of the pelvic organs (ovaries, uterus, and  vagina). This screening involves a pelvic examination, including checking for microscopic changes to the surface of your cervix (Pap test). You may be encouraged to have this screening done every 3 years, beginning at age 22.  For women ages 56-65, health care providers may recommend pelvic exams and Pap testing every 3 years, or they may recommend the Pap and pelvic exam, combined with testing for human papilloma virus (HPV), every 5 years. Some types of HPV increase your risk of cervical cancer. Testing for HPV may also be done on women of any age with unclear Pap test results.  Other health care providers may not recommend any screening for nonpregnant women who are considered low risk for pelvic cancer and who do not have symptoms. Ask your health care provider if a screening pelvic exam is right for you.  If you have had past treatment for cervical cancer or a condition that could lead to cancer, you need Pap tests and screening for cancer for at least 20 years after your treatment. If Pap tests have been discontinued, your risk factors (such as having a new sexual partner) need to be reassessed to determine if screening should resume. Some women have medical problems that increase the chance of getting cervical cancer. In these cases, your health care provider may recommend more frequent screening and Pap tests.  Colorectal Cancer  This type of cancer can be detected and often prevented.  Routine colorectal cancer screening usually begins at 61 years of age and continues through 61 years of age.  Your health care provider may recommend screening at an earlier age if you have risk factors for colon cancer.  Your health care provider may also recommend using home test kits to check for hidden blood in the stool.  A small camera at the end of a tube can be used to examine your colon directly (sigmoidoscopy or colonoscopy). This is done to check for the earliest forms of colorectal  cancer.  Routine screening usually begins at age 33.  Direct examination of the colon should be repeated every 5-10 years through 61 years of age. However, you may need to be screened more often if early forms of precancerous polyps or small growths are found.  Skin Cancer  Check your skin from head to toe regularly.  Tell your health care provider about any new moles or changes in moles, especially if there is a change in a mole's shape or color.  Also tell your health care provider if you have a mole that is larger than the size of a pencil eraser.  Always use sunscreen. Apply sunscreen liberally and repeatedly throughout the day.  Protect yourself by wearing long sleeves, pants, a wide-brimmed hat, and sunglasses whenever you are outside.  Heart disease, diabetes, and high blood pressure  High blood pressure causes heart disease and increases the risk of stroke. High blood pressure is more likely to develop in: ? People who have blood pressure in the high end of  the normal range (130-139/85-89 mm Hg). ? People who are overweight or obese. ? People who are African American.  If you are 21-29 years of age, have your blood pressure checked every 3-5 years. If you are 3 years of age or older, have your blood pressure checked every year. You should have your blood pressure measured twice-once when you are at a hospital or clinic, and once when you are not at a hospital or clinic. Record the average of the two measurements. To check your blood pressure when you are not at a hospital or clinic, you can use: ? An automated blood pressure machine at a pharmacy. ? A home blood pressure monitor.  If you are between 17 years and 37 years old, ask your health care provider if you should take aspirin to prevent strokes.  Have regular diabetes screenings. This involves taking a blood sample to check your fasting blood sugar level. ? If you are at a normal weight and have a low risk for diabetes,  have this test once every three years after 61 years of age. ? If you are overweight and have a high risk for diabetes, consider being tested at a younger age or more often. Preventing infection Hepatitis B  If you have a higher risk for hepatitis B, you should be screened for this virus. You are considered at high risk for hepatitis B if: ? You were born in a country where hepatitis B is common. Ask your health care provider which countries are considered high risk. ? Your parents were born in a high-risk country, and you have not been immunized against hepatitis B (hepatitis B vaccine). ? You have HIV or AIDS. ? You use needles to inject street drugs. ? You live with someone who has hepatitis B. ? You have had sex with someone who has hepatitis B. ? You get hemodialysis treatment. ? You take certain medicines for conditions, including cancer, organ transplantation, and autoimmune conditions.  Hepatitis C  Blood testing is recommended for: ? Everyone born from [birth year] through 1965. ? Anyone with known risk factors for hepatitis C.  Sexually transmitted infections (STIs)  You should be screened for sexually transmitted infections (STIs) including gonorrhea and chlamydia if: ? You are sexually active and are younger than 61 years of age. ? You are older than 61 years of age and your health care provider tells you that you are at risk for this type of infection. ? Your sexual activity has changed since you were last screened and you are at an increased risk for chlamydia or gonorrhea. Ask your health care provider if you are at risk.  If you do not have HIV, but are at risk, it may be recommended that you take a prescription medicine daily to prevent HIV infection. This is called pre-exposure prophylaxis (PrEP). You are considered at risk if: ? You are sexually active and do not regularly use condoms or know the HIV status of your partner(s). ? You take drugs by injection. ? You are  sexually active with a partner who has HIV.  Talk with your health care provider about whether you are at high risk of being infected with HIV. If you choose to begin PrEP, you should first be tested for HIV. You should then be tested every 3 months for as long as you are taking PrEP. Pregnancy  If you are premenopausal and you may become pregnant, ask your health care provider about preconception counseling.  If you may become  pregnant, take 400 to 800 micrograms (mcg) of folic acid every day.  If you want to prevent pregnancy, talk to your health care provider about birth control (contraception). Osteoporosis and menopause  Osteoporosis is a disease in which the bones lose minerals and strength with aging. This can result in serious bone fractures. Your risk for osteoporosis can be identified using a bone density scan.  If you are 39 years of age or older, or if you are at risk for osteoporosis and fractures, ask your health care provider if you should be screened.  Ask your health care provider whether you should take a calcium or vitamin D supplement to lower your risk for osteoporosis.  Menopause may have certain physical symptoms and risks.  Hormone replacement therapy may reduce some of these symptoms and risks. Talk to your health care provider about whether hormone replacement therapy is right for you. Follow these instructions at home:  Schedule regular health, dental, and eye exams.  Stay current with your immunizations.  Do not use any tobacco products including cigarettes, chewing tobacco, or electronic cigarettes.  If you are pregnant, do not drink alcohol.  If you are breastfeeding, limit how much and how often you drink alcohol.  Limit alcohol intake to no more than 1 drink per day for nonpregnant women. One drink equals 12 ounces of beer, 5 ounces of wine, or 1 ounces of hard liquor.  Do not use street drugs.  Do not share needles.  Ask your health care  provider for help if you need support or information about quitting drugs.  Tell your health care provider if you often feel depressed.  Tell your health care provider if you have ever been abused or do not feel safe at home. This information is not intended to replace advice given to you by your health care provider. Make sure you discuss any questions you have with your health care provider. Document Released: 06/24/2011 Document Revised: 05/16/2016 Document Reviewed: 09/12/2015 Elsevier Interactive Patient Education  2018 [place] Years, Female Preventive care refers to lifestyle choices and visits with your health care provider that can promote health and wellness. What does preventive care include?  A yearly physical exam. This is also called an annual well check.  Dental exams once or twice a year.  Routine eye exams. Ask your health care provider how often you should have your eyes checked.  Personal lifestyle choices, including: ? Daily care of your teeth and gums. ? Regular physical activity. ? Eating a healthy diet. ? Avoiding tobacco and drug use. ? Limiting alcohol use. ? Practicing safe sex. ? Taking low-dose aspirin daily starting at age 34. ? Taking vitamin and mineral supplements as recommended by your health care provider. What happens during an annual well check? The services and screenings done by your health care provider during your annual well check will depend on your age, overall health, lifestyle risk factors, and family history of disease. Counseling Your health care provider may ask you questions about your:  Alcohol use.  Tobacco use.  Drug use.  Emotional well-being.  Home and relationship well-being.  Sexual activity.  Eating habits.  Work and work Statistician.  Method of birth control.  Menstrual cycle.  Pregnancy history.  Screening You may have the following tests or measurements:  Height, weight,  and BMI.  Blood pressure.  Lipid and cholesterol levels. These may be checked every 5 years, or more frequently if you are over 50 years  old.  Skin check.  Lung cancer screening. You may have this screening every year starting at age 4 if you have a 30-pack-year history of smoking and currently smoke or have quit within the past 15 years.  Fecal occult blood test (FOBT) of the stool. You may have this test every year starting at age 20.  Flexible sigmoidoscopy or colonoscopy. You may have a sigmoidoscopy every 5 years or a colonoscopy every 10 years starting at age 61.  Hepatitis C blood test.  Hepatitis B blood test.  Sexually transmitted disease (STD) testing.  Diabetes screening. This is done by checking your blood sugar (glucose) after you have not eaten for a while (fasting). You may have this done every 1-3 years.  Mammogram. This may be done every 1-2 years. Talk to your health care provider about when you should start having regular mammograms. This may depend on whether you have a family history of breast cancer.  BRCA-related cancer screening. This may be done if you have a family history of breast, ovarian, tubal, or peritoneal cancers.  Pelvic exam and Pap test. This may be done every 3 years starting at age 43. Starting at age 42, this may be done every 5 years if you have a Pap test in combination with an HPV test.  Bone density scan. This is done to screen for osteoporosis. You may have this scan if you are at high risk for osteoporosis.  Discuss your test results, treatment options, and if necessary, the need for more tests with your health care provider. Vaccines Your health care provider may recommend certain vaccines, such as:  Influenza vaccine. This is recommended every year.  Tetanus, diphtheria, and acellular pertussis (Tdap, Td) vaccine. You may need a Td booster every 10 years.  Varicella vaccine. You may need this if you have not been  vaccinated.  Zoster vaccine. You may need this after age 86.  Measles, mumps, and rubella (MMR) vaccine. You may need at least one dose of MMR if you were born in 1957 or later. You may also need a second dose.  Pneumococcal 13-valent conjugate (PCV13) vaccine. You may need this if you have certain conditions and were not previously vaccinated.  Pneumococcal polysaccharide (PPSV23) vaccine. You may need one or two doses if you smoke cigarettes or if you have certain conditions.  Meningococcal vaccine. You may need this if you have certain conditions.  Hepatitis A vaccine. You may need this if you have certain conditions or if you travel or work in places where you may be exposed to hepatitis A.  Hepatitis B vaccine. You may need this if you have certain conditions or if you travel or work in places where you may be exposed to hepatitis B.  Haemophilus influenzae type b (Hib) vaccine. You may need this if you have certain conditions.  Talk to your health care provider about which screenings and vaccines you need and how often you need them. This information is not intended to replace advice given to you by your health care provider. Make sure you discuss any questions you have with your health care provider. Document Released: 01/05/2016 Document Revised: 08/28/2016 Document Reviewed: 10/10/2015 Elsevier Interactive Patient Education  2018 [place] Zoster (Shingles) Vaccine, RZV: What You Need to Know 1. Why get vaccinated? Shingles (also called herpes zoster, or just zoster) is a painful skin rash, often with blisters. Shingles is caused by the varicella zoster virus, the same virus that causes chickenpox. After you have  chickenpox, the virus stays in your body and can cause shingles later in life. You can't catch shingles from another person. However, a person who has never had chickenpox (or chickenpox vaccine) could get chickenpox from someone with shingles. A shingles  rash usually appears on one side of the face or body and heals within 2 to 4 weeks. Its main symptom is pain, which can be severe. Other symptoms can include fever, headache, chills and upset stomach. Very rarely, a shingles infection can lead to pneumonia, hearing problems, blindness, brain inflammation (encephalitis), or death. For about 1 person in 5, severe pain can continue even long after the rash has cleared up. This long-lasting pain is called post-herpetic neuralgia (PHN). Shingles is far more common in people 82 years of age and older than in younger people, and the risk increases with age. It is also more common in people whose immune system is weakened because of a disease such as cancer, or by drugs such as steroids or chemotherapy. At least 1 million people a year in the [place] get shingles. 2. Shingles vaccine (recombinant) Recombinant shingles vaccine was approved by FDA in 2017 for the prevention of shingles. In clinical trials, it was more than 90% effective in preventing shingles. It can also reduce the likelihood of PHN. Two doses, 2 to 6 months apart, are recommended for adults 58 and older. This vaccine is also recommended for people who have already gotten the live shingles vaccine (Zostavax). There is no live virus in this vaccine. 3. Some people should not get this vaccine Tell your vaccine provider if you:  Have any severe, life-threatening allergies. A person who has ever had a life-threatening allergic reaction after a dose of recombinant shingles vaccine, or has a severe allergy to any component of this vaccine, may be advised not to be vaccinated. Ask your health care provider if you want information about vaccine components.  Are pregnant or breastfeeding. There is not much information about use of recombinant shingles vaccine in pregnant or nursing women. Your healthcare provider might recommend delaying vaccination.  Are not feeling well. If you have a mild  illness, such as a cold, you can probably get the vaccine today. If you are moderately or severely ill, you should probably wait until you recover. Your doctor can advise you.  4. Risks of a vaccine reaction With any medicine, including vaccines, there is a chance of reactions. After recombinant shingles vaccination, a person might experience:  Pain, redness, soreness, or swelling at the site of the injection  Headache, muscle aches, fever, shivering, fatigue  In clinical trials, most people got a sore arm with mild or moderate pain after vaccination, and some also had redness and swelling where they got the shot. Some people felt tired, had muscle pain, a headache, shivering, fever, stomach pain, or nausea. About 1 out of 6 people who got recombinant zoster vaccine experienced side effects that prevented them from doing regular activities. Symptoms went away on their own in about 2 to 3 days. Side effects were more common in younger people. You should still get the second dose of recombinant zoster vaccine even if you had one of these reactions after the first dose. Other things that could happen after this vaccine:  People sometimes faint after medical procedures, including vaccination. Sitting or lying down for about 15 minutes can help prevent fainting and injuries caused by a fall. Tell your provider if you feel dizzy or have vision changes or ringing in  the ears.  Some people get shoulder pain that can be more severe and longer-lasting than routine soreness that can follow injections. This happens very rarely.  Any medication can cause a severe allergic reaction. Such reactions to a vaccine are estimated at about 1 in a million doses, and would happen within a few minutes to a few hours after the vaccination. As with any medicine, there is a very remote chance of a vaccine causing a serious injury or death. The safety of vaccines is always being monitored. For more information, visit:  http://www.aguilar.org/ 5. What if there is a serious problem? What should I look for?  Look for anything that concerns you, such as signs of a severe allergic reaction, very high fever, or unusual behavior. Signs of a severe allergic reaction can include hives, swelling of the face and throat, difficulty breathing, a fast heartbeat, dizziness, and weakness. These would usually start a few minutes to a few hours after the vaccination. What should I do?  If you think it is a severe allergic reaction or other emergency that can't wait, call 9-1-1 and get to the nearest hospital. Otherwise, call your health care provider. Afterward, the reaction should be reported to the Vaccine Adverse Event Reporting System (VAERS). Your doctor should file this report, or you can do it yourself through the VAERS web site atwww.vaers.https://www.bray.com/ by calling 714-732-5833. VAERS does not give medical advice. 6. How can I learn more?  Ask your healthcare provider. He or she can give you the vaccine package insert or suggest other sources of information.  Call your local or state health department.  Contact the Centers for Disease Control and Prevention (CDC): ? Call 782-625-3142 (1-800-CDC-INFO) or ? Visit the CDC's website at http://hunter.com/ CDC Vaccine Information Statement (VIS) Recombinant Zoster Vaccine (02/03/2017) This information is not intended to replace advice given to you by your health care provider. Make sure you discuss any questions you have with your health care provider. Document Released: 02/18/2017 Document Revised: 02/18/2017 Document Reviewed: 02/18/2017 Elsevier Interactive Patient Education  2018 [place] Years, Female Preventive care refers to lifestyle choices and visits with your health care provider that can promote health and wellness. What does preventive care include?  A yearly physical exam. This is also called an annual well  check.  Dental exams once or twice a year.  Routine eye exams. Ask your health care provider how often you should have your eyes checked.  Personal lifestyle choices, including: ? Daily care of your teeth and gums. ? Regular physical activity. ? Eating a healthy diet. ? Avoiding tobacco and drug use. ? Limiting alcohol use. ? Practicing safe sex. ? Taking low-dose aspirin daily starting at age 50. ? Taking vitamin and mineral supplements as recommended by your health care provider. What happens during an annual well check? The services and screenings done by your health care provider during your annual well check will depend on your age, overall health, lifestyle risk factors, and family history of disease. Counseling Your health care provider may ask you questions about your:  Alcohol use.  Tobacco use.  Drug use.  Emotional well-being.  Home and relationship well-being.  Sexual activity.  Eating habits.  Work and work Statistician.  Method of birth control.  Menstrual cycle.  Pregnancy history.  Screening You may have the following tests or measurements:  Height, weight, and BMI.  Blood pressure.  Lipid and cholesterol levels. These may be checked every 5 years, or more  frequently if you are over 47 years old.  Skin check.  Lung cancer screening. You may have this screening every year starting at age 51 if you have a 30-pack-year history of smoking and currently smoke or have quit within the past 15 years.  Fecal occult blood test (FOBT) of the stool. You may have this test every year starting at age 72.  Flexible sigmoidoscopy or colonoscopy. You may have a sigmoidoscopy every 5 years or a colonoscopy every 10 years starting at age 53.  Hepatitis C blood test.  Hepatitis B blood test.  Sexually transmitted disease (STD) testing.  Diabetes screening. This is done by checking your blood sugar (glucose) after you have not eaten for a while (fasting).  You may have this done every 1-3 years.  Mammogram. This may be done every 1-2 years. Talk to your health care provider about when you should start having regular mammograms. This may depend on whether you have a family history of breast cancer.  BRCA-related cancer screening. This may be done if you have a family history of breast, ovarian, tubal, or peritoneal cancers.  Pelvic exam and Pap test. This may be done every 3 years starting at age 45. Starting at age 7, this may be done every 5 years if you have a Pap test in combination with an HPV test.  Bone density scan. This is done to screen for osteoporosis. You may have this scan if you are at high risk for osteoporosis.  Discuss your test results, treatment options, and if necessary, the need for more tests with your health care provider. Vaccines Your health care provider may recommend certain vaccines, such as:  Influenza vaccine. This is recommended every year.  Tetanus, diphtheria, and acellular pertussis (Tdap, Td) vaccine. You may need a Td booster every 10 years.  Varicella vaccine. You may need this if you have not been vaccinated.  Zoster vaccine. You may need this after age 16.  Measles, mumps, and rubella (MMR) vaccine. You may need at least one dose of MMR if you were born in 1957 or later. You may also need a second dose.  Pneumococcal 13-valent conjugate (PCV13) vaccine. You may need this if you have certain conditions and were not previously vaccinated.  Pneumococcal polysaccharide (PPSV23) vaccine. You may need one or two doses if you smoke cigarettes or if you have certain conditions.  Meningococcal vaccine. You may need this if you have certain conditions.  Hepatitis A vaccine. You may need this if you have certain conditions or if you travel or work in places where you may be exposed to hepatitis A.  Hepatitis B vaccine. You may need this if you have certain conditions or if you travel or work in places where  you may be exposed to hepatitis B.  Haemophilus influenzae type b (Hib) vaccine. You may need this if you have certain conditions.  Talk to your health care provider about which screenings and vaccines you need and how often you need them. This information is not intended to replace advice given to you by your health care provider. Make sure you discuss any questions you have with your health care provider. Document Released: 01/05/2016 Document Revised: 08/28/2016 Document Reviewed: 10/10/2015 Elsevier Interactive Patient Education  2018 [place] Zoster (Shingles) Vaccine, RZV: What You Need to Know 1. Why get vaccinated? Shingles (also called herpes zoster, or just zoster) is a painful skin rash, often with blisters. Shingles is caused by the varicella zoster virus, the  same virus that causes chickenpox. After you have chickenpox, the virus stays in your body and can cause shingles later in life. You can't catch shingles from another person. However, a person who has never had chickenpox (or chickenpox vaccine) could get chickenpox from someone with shingles. A shingles rash usually appears on one side of the face or body and heals within 2 to 4 weeks. Its main symptom is pain, which can be severe. Other symptoms can include fever, headache, chills and upset stomach. Very rarely, a shingles infection can lead to pneumonia, hearing problems, blindness, brain inflammation (encephalitis), or death. For about 1 person in 5, severe pain can continue even long after the rash has cleared up. This long-lasting pain is called post-herpetic neuralgia (PHN). Shingles is far more common in people 36 years of age and older than in younger people, and the risk increases with age. It is also more common in people whose immune system is weakened because of a disease such as cancer, or by drugs such as steroids or chemotherapy. At least 1 million people a year in the [place] get shingles. 2.  Shingles vaccine (recombinant) Recombinant shingles vaccine was approved by FDA in 2017 for the prevention of shingles. In clinical trials, it was more than 90% effective in preventing shingles. It can also reduce the likelihood of PHN. Two doses, 2 to 6 months apart, are recommended for adults 94 and older. This vaccine is also recommended for people who have already gotten the live shingles vaccine (Zostavax). There is no live virus in this vaccine. 3. Some people should not get this vaccine Tell your vaccine provider if you:  Have any severe, life-threatening allergies. A person who has ever had a life-threatening allergic reaction after a dose of recombinant shingles vaccine, or has a severe allergy to any component of this vaccine, may be advised not to be vaccinated. Ask your health care provider if you want information about vaccine components.  Are pregnant or breastfeeding. There is not much information about use of recombinant shingles vaccine in pregnant or nursing women. Your healthcare provider might recommend delaying vaccination.  Are not feeling well. If you have a mild illness, such as a cold, you can probably get the vaccine today. If you are moderately or severely ill, you should probably wait until you recover. Your doctor can advise you.  4. Risks of a vaccine reaction With any medicine, including vaccines, there is a chance of reactions. After recombinant shingles vaccination, a person might experience:  Pain, redness, soreness, or swelling at the site of the injection  Headache, muscle aches, fever, shivering, fatigue  In clinical trials, most people got a sore arm with mild or moderate pain after vaccination, and some also had redness and swelling where they got the shot. Some people felt tired, had muscle pain, a headache, shivering, fever, stomach pain, or nausea. About 1 out of 6 people who got recombinant zoster vaccine experienced side effects that prevented them  from doing regular activities. Symptoms went away on their own in about 2 to 3 days. Side effects were more common in younger people. You should still get the second dose of recombinant zoster vaccine even if you had one of these reactions after the first dose. Other things that could happen after this vaccine:  People sometimes faint after medical procedures, including vaccination. Sitting or lying down for about 15 minutes can help prevent fainting and injuries caused by a fall. Tell your provider if you feel  dizzy or have vision changes or ringing in the ears.  Some people get shoulder pain that can be more severe and longer-lasting than routine soreness that can follow injections. This happens very rarely.  Any medication can cause a severe allergic reaction. Such reactions to a vaccine are estimated at about 1 in a million doses, and would happen within a few minutes to a few hours after the vaccination. As with any medicine, there is a very remote chance of a vaccine causing a serious injury or death. The safety of vaccines is always being monitored. For more information, visit: http://www.aguilar.org/ 5. What if there is a serious problem? What should I look for?  Look for anything that concerns you, such as signs of a severe allergic reaction, very high fever, or unusual behavior. Signs of a severe allergic reaction can include hives, swelling of the face and throat, difficulty breathing, a fast heartbeat, dizziness, and weakness. These would usually start a few minutes to a few hours after the vaccination. What should I do?  If you think it is a severe allergic reaction or other emergency that can't wait, call 9-1-1 and get to the nearest hospital. Otherwise, call your health care provider. Afterward, the reaction should be reported to the Vaccine Adverse Event Reporting System (VAERS). Your doctor should file this report, or you can do it yourself through the VAERS web site  atwww.vaers.https://www.bray.com/ by calling (415)459-2999. VAERS does not give medical advice. 6. How can I learn more?  Ask your healthcare provider. He or she can give you the vaccine package insert or suggest other sources of information.  Call your local or state health department.  Contact the Centers for Disease Control and Prevention (CDC): ? Call 405 795 4389 (1-800-CDC-INFO) or ? Visit the CDC's website at http://hunter.com/ CDC Vaccine Information Statement (VIS) Recombinant Zoster Vaccine (02/03/2017) This information is not intended to replace advice given to you by your health care provider. Make sure you discuss any questions you have with your health care provider. Document Released: 02/18/2017 Document Revised: 02/18/2017 Document Reviewed: 02/18/2017 Elsevier Interactive Patient Education  2018 [place] Maintenance, Female Adopting a healthy lifestyle and getting preventive care can go a long way to promote health and wellness. Talk with your health care provider about what schedule of regular examinations is right for you. This is a good chance for you to check in with your provider about disease prevention and staying healthy. In between checkups, there are plenty of things you can do on your own. Experts have done a lot of research about which lifestyle changes and preventive measures are most likely to keep you healthy. Ask your health care provider for more information. Weight and diet Eat a healthy diet  Be sure to include plenty of vegetables, fruits, low-fat dairy products, and lean protein.  Do not eat a lot of foods high in solid fats, added sugars, or salt.  Get regular exercise. This is one of the most important things you can do for your health. ? Most adults should exercise for at least 150 minutes each week. The exercise should increase your heart rate and make you sweat (moderate-intensity exercise). ? Most adults should also do strengthening  exercises at least twice a week. This is in addition to the moderate-intensity exercise.  Maintain a healthy weight  Body mass index (BMI) is a measurement that can be used to identify possible weight problems. It estimates body fat based on height and weight. Your health care provider can  help determine your BMI and help you achieve or maintain a healthy weight.  For females 50 years of age and older: ? A BMI below 18.5 is considered underweight. ? A BMI of 18.5 to 24.9 is normal. ? A BMI of 25 to 29.9 is considered overweight. ? A BMI of 30 and above is considered obese.  Watch levels of cholesterol and blood lipids  You should start having your blood tested for lipids and cholesterol at 61 years of age, then have this test every 5 years.  You may need to have your cholesterol levels checked more often if: ? Your lipid or cholesterol levels are high. ? You are older than 61 years of age. ? You are at high risk for heart disease.  Cancer screening Lung Cancer  Lung cancer screening is recommended for adults 10-61 years old who are at high risk for lung cancer because of a history of smoking.  A yearly low-dose CT scan of the lungs is recommended for people who: ? Currently smoke. ? Have quit within the past 15 years. ? Have at least a 30-pack-year history of smoking. A pack year is smoking an average of one pack of cigarettes a day for 1 year.  Yearly screening should continue until it has been 15 years since you quit.  Yearly screening should stop if you develop a health problem that would prevent you from having lung cancer treatment.  Breast Cancer  Practice breast self-awareness. This means understanding how your breasts normally appear and feel.  It also means doing regular breast self-exams. Let your health care provider know about any changes, no matter how small.  If you are in your 20s or 30s, you should have a clinical breast exam (CBE) by a health care provider  every 1-3 years as part of a regular health exam.  If you are 3 or older, have a CBE every year. Also consider having a breast X-ray (mammogram) every year.  If you have a family history of breast cancer, talk to your health care provider about genetic screening.  If you are at high risk for breast cancer, talk to your health care provider about having an MRI and a mammogram every year.  Breast cancer gene (BRCA) assessment is recommended for women who have family members with BRCA-related cancers. BRCA-related cancers include: ? Breast. ? Ovarian. ? Tubal. ? Peritoneal cancers.  Results of the assessment will determine the need for genetic counseling and BRCA1 and BRCA2 testing.  Cervical Cancer Your health care provider may recommend that you be screened regularly for cancer of the pelvic organs (ovaries, uterus, and vagina). This screening involves a pelvic examination, including checking for microscopic changes to the surface of your cervix (Pap test). You may be encouraged to have this screening done every 3 years, beginning at age 65.  For women ages 35-65, health care providers may recommend pelvic exams and Pap testing every 3 years, or they may recommend the Pap and pelvic exam, combined with testing for human papilloma virus (HPV), every 5 years. Some types of HPV increase your risk of cervical cancer. Testing for HPV may also be done on women of any age with unclear Pap test results.  Other health care providers may not recommend any screening for nonpregnant women who are considered low risk for pelvic cancer and who do not have symptoms. Ask your health care provider if a screening pelvic exam is right for you.  If you have had past treatment for  cervical cancer or a condition that could lead to cancer, you need Pap tests and screening for cancer for at least 20 years after your treatment. If Pap tests have been discontinued, your risk factors (such as having a new sexual  partner) need to be reassessed to determine if screening should resume. Some women have medical problems that increase the chance of getting cervical cancer. In these cases, your health care provider may recommend more frequent screening and Pap tests.  Colorectal Cancer  This type of cancer can be detected and often prevented.  Routine colorectal cancer screening usually begins at 61 years of age and continues through 61 years of age.  Your health care provider may recommend screening at an earlier age if you have risk factors for colon cancer.  Your health care provider may also recommend using home test kits to check for hidden blood in the stool.  A small camera at the end of a tube can be used to examine your colon directly (sigmoidoscopy or colonoscopy). This is done to check for the earliest forms of colorectal cancer.  Routine screening usually begins at age 76.  Direct examination of the colon should be repeated every 5-10 years through 61 years of age. However, you may need to be screened more often if early forms of precancerous polyps or small growths are found.  Skin Cancer  Check your skin from head to toe regularly.  Tell your health care provider about any new moles or changes in moles, especially if there is a change in a mole's shape or color.  Also tell your health care provider if you have a mole that is larger than the size of a pencil eraser.  Always use sunscreen. Apply sunscreen liberally and repeatedly throughout the day.  Protect yourself by wearing long sleeves, pants, a wide-brimmed hat, and sunglasses whenever you are outside.  Heart disease, diabetes, and high blood pressure  High blood pressure causes heart disease and increases the risk of stroke. High blood pressure is more likely to develop in: ? People who have blood pressure in the high end of the normal range (130-139/85-89 mm Hg). ? People who are overweight or obese. ? People who are African  American.  If you are 74-38 years of age, have your blood pressure checked every 3-5 years. If you are 13 years of age or older, have your blood pressure checked every year. You should have your blood pressure measured twice-once when you are at a hospital or clinic, and once when you are not at a hospital or clinic. Record the average of the two measurements. To check your blood pressure when you are not at a hospital or clinic, you can use: ? An automated blood pressure machine at a pharmacy. ? A home blood pressure monitor.  If you are between 13 years and 35 years old, ask your health care provider if you should take aspirin to prevent strokes.  Have regular diabetes screenings. This involves taking a blood sample to check your fasting blood sugar level. ? If you are at a normal weight and have a low risk for diabetes, have this test once every three years after 61 years of age. ? If you are overweight and have a high risk for diabetes, consider being tested at a younger age or more often. Preventing infection Hepatitis B  If you have a higher risk for hepatitis B, you should be screened for this virus. You are considered at high risk for hepatitis  B if: ? You were born in a country where hepatitis B is common. Ask your health care provider which countries are considered high risk. ? Your parents were born in a high-risk country, and you have not been immunized against hepatitis B (hepatitis B vaccine). ? You have HIV or AIDS. ? You use needles to inject street drugs. ? You live with someone who has hepatitis B. ? You have had sex with someone who has hepatitis B. ? You get hemodialysis treatment. ? You take certain medicines for conditions, including cancer, organ transplantation, and autoimmune conditions.  Hepatitis C  Blood testing is recommended for: ? Everyone born from [birth year] through 1965. ? Anyone with known risk factors for hepatitis C.  Sexually transmitted infections  (STIs)  You should be screened for sexually transmitted infections (STIs) including gonorrhea and chlamydia if: ? You are sexually active and are younger than 61 years of age. ? You are older than 61 years of age and your health care provider tells you that you are at risk for this type of infection. ? Your sexual activity has changed since you were last screened and you are at an increased risk for chlamydia or gonorrhea. Ask your health care provider if you are at risk.  If you do not have HIV, but are at risk, it may be recommended that you take a prescription medicine daily to prevent HIV infection. This is called pre-exposure prophylaxis (PrEP). You are considered at risk if: ? You are sexually active and do not regularly use condoms or know the HIV status of your partner(s). ? You take drugs by injection. ? You are sexually active with a partner who has HIV.  Talk with your health care provider about whether you are at high risk of being infected with HIV. If you choose to begin PrEP, you should first be tested for HIV. You should then be tested every 3 months for as long as you are taking PrEP. Pregnancy  If you are premenopausal and you may become pregnant, ask your health care provider about preconception counseling.  If you may become pregnant, take 400 to 800 micrograms (mcg) of folic acid every day.  If you want to prevent pregnancy, talk to your health care provider about birth control (contraception). Osteoporosis and menopause  Osteoporosis is a disease in which the bones lose minerals and strength with aging. This can result in serious bone fractures. Your risk for osteoporosis can be identified using a bone density scan.  If you are 37 years of age or older, or if you are at risk for osteoporosis and fractures, ask your health care provider if you should be screened.  Ask your health care provider whether you should take a calcium or vitamin D supplement to lower your risk  for osteoporosis.  Menopause may have certain physical symptoms and risks.  Hormone replacement therapy may reduce some of these symptoms and risks. Talk to your health care provider about whether hormone replacement therapy is right for you. Follow these instructions at home:  Schedule regular health, dental, and eye exams.  Stay current with your immunizations.  Do not use any tobacco products including cigarettes, chewing tobacco, or electronic cigarettes.  If you are pregnant, do not drink alcohol.  If you are breastfeeding, limit how much and how often you drink alcohol.  Limit alcohol intake to no more than 1 drink per day for nonpregnant women. One drink equals 12 ounces of beer, 5 ounces of wine, or  1 ounces of hard liquor.  Do not use street drugs.  Do not share needles.  Ask your health care provider for help if you need support or information about quitting drugs.  Tell your health care provider if you often feel depressed.  Tell your health care provider if you have ever been abused or do not feel safe at home. This information is not intended to replace advice given to you by your health care provider. Make sure you discuss any questions you have with your health care provider. Document Released: 06/24/2011 Document Revised: 05/16/2016 Document Reviewed: 09/12/2015 Elsevier Interactive Patient Education  2018 [place] Zoster (Shingles) Vaccine, RZV: What You Need to Know 1. Why get vaccinated? Shingles (also called herpes zoster, or just zoster) is a painful skin rash, often with blisters. Shingles is caused by the varicella zoster virus, the same virus that causes chickenpox. After you have chickenpox, the virus stays in your body and can cause shingles later in life. You can't catch shingles from another person. However, a person who has never had chickenpox (or chickenpox vaccine) could get chickenpox from someone with shingles. A shingles rash  usually appears on one side of the face or body and heals within 2 to 4 weeks. Its main symptom is pain, which can be severe. Other symptoms can include fever, headache, chills and upset stomach. Very rarely, a shingles infection can lead to pneumonia, hearing problems, blindness, brain inflammation (encephalitis), or death. For about 1 person in 5, severe pain can continue even long after the rash has cleared up. This long-lasting pain is called post-herpetic neuralgia (PHN). Shingles is far more common in people 49 years of age and older than in younger people, and the risk increases with age. It is also more common in people whose immune system is weakened because of a disease such as cancer, or by drugs such as steroids or chemotherapy. At least 1 million people a year in the [place] get shingles. 2. Shingles vaccine (recombinant) Recombinant shingles vaccine was approved by FDA in 2017 for the prevention of shingles. In clinical trials, it was more than 90% effective in preventing shingles. It can also reduce the likelihood of PHN. Two doses, 2 to 6 months apart, are recommended for adults 64 and older. This vaccine is also recommended for people who have already gotten the live shingles vaccine (Zostavax). There is no live virus in this vaccine. 3. Some people should not get this vaccine Tell your vaccine provider if you:  Have any severe, life-threatening allergies. A person who has ever had a life-threatening allergic reaction after a dose of recombinant shingles vaccine, or has a severe allergy to any component of this vaccine, may be advised not to be vaccinated. Ask your health care provider if you want information about vaccine components.  Are pregnant or breastfeeding. There is not much information about use of recombinant shingles vaccine in pregnant or nursing women. Your healthcare provider might recommend delaying vaccination.  Are not feeling well. If you have a mild illness,  such as a cold, you can probably get the vaccine today. If you are moderately or severely ill, you should probably wait until you recover. Your doctor can advise you.  4. Risks of a vaccine reaction With any medicine, including vaccines, there is a chance of reactions. After recombinant shingles vaccination, a person might experience:  Pain, redness, soreness, or swelling at the site of the injection  Headache, muscle aches, fever, shivering, fatigue  In clinical trials, most people got a sore arm with mild or moderate pain after vaccination, and some also had redness and swelling where they got the shot. Some people felt tired, had muscle pain, a headache, shivering, fever, stomach pain, or nausea. About 1 out of 6 people who got recombinant zoster vaccine experienced side effects that prevented them from doing regular activities. Symptoms went away on their own in about 2 to 3 days. Side effects were more common in younger people. You should still get the second dose of recombinant zoster vaccine even if you had one of these reactions after the first dose. Other things that could happen after this vaccine:  People sometimes faint after medical procedures, including vaccination. Sitting or lying down for about 15 minutes can help prevent fainting and injuries caused by a fall. Tell your provider if you feel dizzy or have vision changes or ringing in the ears.  Some people get shoulder pain that can be more severe and longer-lasting than routine soreness that can follow injections. This happens very rarely.  Any medication can cause a severe allergic reaction. Such reactions to a vaccine are estimated at about 1 in a million doses, and would happen within a few minutes to a few hours after the vaccination. As with any medicine, there is a very remote chance of a vaccine causing a serious injury or death. The safety of vaccines is always being monitored. For more information, visit:  http://www.aguilar.org/ 5. What if there is a serious problem? What should I look for?  Look for anything that concerns you, such as signs of a severe allergic reaction, very high fever, or unusual behavior. Signs of a severe allergic reaction can include hives, swelling of the face and throat, difficulty breathing, a fast heartbeat, dizziness, and weakness. These would usually start a few minutes to a few hours after the vaccination. What should I do?  If you think it is a severe allergic reaction or other emergency that can't wait, call 9-1-1 and get to the nearest hospital. Otherwise, call your health care provider. Afterward, the reaction should be reported to the Vaccine Adverse Event Reporting System (VAERS). Your doctor should file this report, or you can do it yourself through the VAERS web site atwww.vaers.https://www.bray.com/ by calling 702-415-1039. VAERS does not give medical advice. 6. How can I learn more?  Ask your healthcare provider. He or she can give you the vaccine package insert or suggest other sources of information.  Call your local or state health department.  Contact the Centers for Disease Control and Prevention (CDC): ? Call 332 855 8426 (1-800-CDC-INFO) or ? Visit the CDC's website at http://hunter.com/ CDC Vaccine Information Statement (VIS) Recombinant Zoster Vaccine (02/03/2017) This information is not intended to replace advice given to you by your health care provider. Make sure you discuss any questions you have with your health care provider. Document Released: 02/18/2017 Document Revised: 02/18/2017 Document Reviewed: 02/18/2017 Elsevier Interactive Patient Education  [employer].

## 2017-12-12 NOTE — Progress Notes (Signed)
Assessment & Plan:  Cynthia Best was seen today for annual exam.  Diagnoses and all orders for this visit:  Encounter for well woman exam with abnormal findings -     Ambulatory referral to Dermatology -     triamcinolone cream (KENALOG) 0.1 %; Apply 1 application topically 2 (two) times daily. -     CBC -     Basic metabolic panel -     Lipid panel  Breast cancer screening by mammogram -     MM DIGITAL SCREENING BILATERAL; Future  Restless leg syndrome -     rOPINIRole (REQUIP) 1 MG tablet; Take 3 tablets (3 mg total) by mouth at bedtime.  Vitamin D deficiency -     VITAMIN D 25 Hydroxy (Vit-D Deficiency, Fractures)  Encounter for administration of vaccine  Zoster Vaccine Adjuvanted [facility](SHINGRIX) injection; Inject 0.5 mLs into the muscle once for 1 dose.    Patient has been counseled on age-appropriate routine health concerns for screening and prevention. These are reviewed and up-to-date. Referrals have been placed accordingly. Immunizations are up-to-date or declined.    Subjective:   Chief Complaint  Patient presents with  . Annual Exam    Patient is here for a physical. Patient would like to have bloodwork done for her cholesterol, check her middle finger due to pain, hot flashes.Patient stated she would like medication refills.    HPI Cynthia Best Cynthia Best 61 y.o. female presents to office today   Review of Systems  Constitutional: Negative for fever, malaise/fatigue and weight loss.  HENT: Negative.  Negative for nosebleeds.   Eyes: Negative.  Negative for blurred vision, double vision and photophobia.  Respiratory: Negative.  Negative for cough and shortness of breath.   Cardiovascular: Negative.  Negative for chest pain, palpitations and leg swelling.  Gastrointestinal: Negative.  Negative for abdominal pain, constipation, diarrhea, heartburn, nausea and vomiting.  Musculoskeletal: Negative.  Negative for myalgias.  Skin: Positive for rash.  Neurological: Negative.  Negative  for dizziness, focal weakness, seizures and headaches.  Endo/Heme/Allergies: Negative for environmental allergies.  Psychiatric/Behavioral: Negative.  Negative for suicidal ideas.    Past Medical History:  Diagnosis Date  . Arthritis   . Depression   . Herpes   . Scoliosis   . Spondylolysis of cervical region     Past Surgical History:  Procedure Laterality Date  . ABDOMINAL HYSTERECTOMY  01/14/2006  . APPENDECTOMY  01/14/2005  . Myoectomy    . PLACEMENT OF BREAST IMPLANTS    . VAGINAL PROLAPSE REPAIR  04/14/2006    Family History  Problem Relation Age of Onset  . Stroke Mother   . Heart disease Father   . Hyperlipidemia Brother   . Diabetes Maternal Grandmother     Social History Reviewed with no changes to be made today.   Outpatient Medications Prior to Visit  Medication Sig Dispense Refill  . amphetamine-dextroamphetamine (ADDERALL) 20 MG tablet Take 20 mg by mouth 2 (two) times daily.    Marland Kitchen. FLUoxetine (PROZAC) 20 MG capsule Take 20 mg by mouth daily.    Marland Kitchen. FLUoxetine (PROZAC) 40 MG capsule Take 40 mg by mouth daily.    . hydrocortisone 2.5 % cream Apply topically 2 (two) times daily. 30 g 0  . QUEtiapine (SEROQUEL XR) 200 MG 24 hr tablet Take 100 mg by mouth at bedtime.    Marland Kitchen. QUEtiapine (SEROQUEL) 100 MG tablet Take 50 mg by mouth at bedtime. Take 1 and 1/2     . rOPINIRole (REQUIP) 1  MG tablet Take 3 tablets (3 mg total) by mouth at bedtime. 180 tablet 0  . albuterol (PROVENTIL HFA;VENTOLIN HFA) 108 (90 Base) MCG/ACT inhaler Inhale 2 puffs into the lungs every 4 (four) hours as needed for wheezing or shortness of breath. (Patient not taking: Reported on 11/11/2017) 1 Inhaler 3  . clindamycin (CLEOCIN) 300 MG capsule Take 1 capsule (300 mg total) by mouth 2 (two) times daily. (Patient not taking: Reported on 12/12/2017) 14 capsule 0  . Spacer/Aero-Holding Chambers (AEROCHAMBER PLUS WITH MASK) inhaler Use as instructed (Patient not taking: Reported on 10/20/2017) 1 each 2    No facility-administered medications prior to visit.     Allergies  Allergen Reactions  . Thimerosal Swelling    In eyes, due to thimerosal in contact solution   . Sulfa Antibiotics Other (See Comments)    Doesn't remember        Objective:    BP 106/68 (BP Location: Left Arm, Patient Position: Sitting, Cuff Size: Normal)   Pulse 87   Temp 99.3 F (37.4 C) (Oral)   Ht 5\' 9"  (1.753 m)   Wt 144 lb (65.3 kg)   SpO2 95%   BMI 21.27 kg/m  Wt Readings from Last 3 Encounters:  12/12/17 144 lb (65.3 kg)  11/11/17 141 lb 12.8 oz (64.3 kg)  10/20/17 141 lb 12.8 oz (64.3 kg)    Physical Exam  Constitutional: She is oriented to person, place, and time. She appears well-developed and well-nourished.  HENT:  Head: Normocephalic and atraumatic.  Right Ear: External ear normal.  Left Ear: External ear normal.  Nose: Nose normal.  Mouth/Throat: Oropharynx is clear and moist. No oropharyngeal exudate.  Eyes: Conjunctivae and EOM are normal. Pupils are equal, round, and reactive to light. Right eye exhibits no discharge. No scleral icterus.  Neck: Normal range of motion. Neck supple. No tracheal deviation present. No thyromegaly present.  Cardiovascular: Normal rate, regular rhythm, normal heart sounds and intact distal pulses. Exam reveals no friction rub.  No murmur heard. Pulmonary/Chest: Effort normal and breath sounds normal. No accessory muscle usage. No respiratory distress. She has no decreased breath sounds. She has no wheezes. She has no rhonchi. She has no rales. She exhibits no tenderness. Right breast exhibits tenderness. Right breast exhibits no inverted nipple, no mass, no nipple discharge and no skin change. Left breast exhibits skin change. Left breast exhibits no inverted nipple, no mass, no nipple discharge and no tenderness. Breasts are asymmetrical.    Patient has bilateral breast implants which makes manual breast exam somewhat difficult.   Abdominal: Soft. Bowel  sounds are normal. She exhibits no distension and no mass. There is no tenderness. There is no rebound and no guarding.  Genitourinary: No breast swelling, discharge or bleeding. No labial fusion. There is no rash, tenderness, lesion or injury on the right labia. There is no rash, tenderness, lesion or injury on the left labia. Right adnexum displays no mass, no tenderness and no fullness. Left adnexum displays no mass, no tenderness and no fullness. No erythema, tenderness or bleeding in the vagina. No foreign body in the vagina. No signs of injury around the vagina. No vaginal discharge found.  Genitourinary Comments: Hysterectomy; NO PAP Only Pelvic Exam was performed.   Musculoskeletal: Normal range of motion. She exhibits no edema, tenderness or deformity.  Lymphadenopathy:    She has no cervical adenopathy.  Neurological: She is alert and oriented to person, place, and time. She has normal reflexes. No cranial nerve  deficit. Coordination normal.  Skin: Skin is warm and dry. No erythema.     Patient has multiple areas of actinic keratotic appearing lesions over body.   Psychiatric: She has a normal mood and affect. Her speech is normal and behavior is normal. Judgment and thought content normal.       Patient has been counseled extensively about nutrition and exercise as well as the importance of adherence with medications and regular follow-up. The patient was given clear instructions to go to ER or return to medical center if symptoms don't improve, worsen or new problems develop. The patient verbalized understanding.   Follow-up: Return in about 6 months (around 06/12/2018).   Claiborne Rigg, FNP-BC [facility] [place], [place] [phone]   12/12/2017, 12:51 PM

## 2017-12-13 LAB — BASIC METABOLIC PANEL
BUN/Creatinine Ratio: 13 (ref 12–28)
BUN: 10 mg/dL (ref 8–27)
CALCIUM: 9.5 mg/dL (ref 8.7–10.3)
CO2: 23 mmol/L (ref 20–29)
CREATININE: 0.79 mg/dL (ref 0.57–1.00)
Chloride: 103 mmol/L (ref 96–106)
GFR calc non Af Amer: 81 mL/min/{1.73_m2} (ref >59)
GFR, EST AFRICAN AMERICAN: 93 mL/min/{1.73_m2} (ref >59)
Glucose: 89 mg/dL (ref 65–99)
Potassium: 4.7 mmol/L (ref 3.5–5.2)
Sodium: 142 mmol/L (ref 134–144)

## 2017-12-13 LAB — LIPID PANEL
CHOL/HDL RATIO: 2.9 ratio (ref 0.0–4.4)
Cholesterol, Total: 224 mg/dL — ABNORMAL HIGH (ref 100–199)
HDL: 78 mg/dL (ref >39)
LDL CALC: 123 mg/dL — AB (ref 0–99)
TRIGLYCERIDES: 115 mg/dL (ref 0–149)
VLDL CHOLESTEROL CAL: 23 mg/dL (ref 5–40)

## 2017-12-13 LAB — CBC
Hematocrit: 42.2 % (ref 34.0–46.6)
Hemoglobin: 13.9 g/dL (ref 11.1–15.9)
MCH: 32.3 pg (ref 26.6–33.0)
MCHC: 32.9 g/dL (ref 31.5–35.7)
MCV: 98 fL — ABNORMAL HIGH (ref 79–97)
Platelets: 290 10*3/uL (ref 150–379)
RBC: 4.3 x10E6/uL (ref 3.77–5.28)
RDW: 13.8 % (ref 12.3–15.4)
WBC: 5.6 10*3/uL (ref 3.4–10.8)

## 2017-12-13 LAB — VITAMIN D 25 HYDROXY (VIT D DEFICIENCY, FRACTURES): VIT D 25 HYDROXY: 15.6 ng/mL — AB (ref 30.0–100.0)

## 2017-12-23 ENCOUNTER — Other Ambulatory Visit: Payer: Self-pay | Admitting: Nurse Practitioner

## 2017-12-23 MED ORDER — VITAMIN D (ERGOCALCIFEROL) 1.25 MG (50000 UNIT) PO CAPS: 50000.0000 [IU] | ORAL_CAPSULE | ORAL | 1 refills | Status: DC

## 2017-12-24 ENCOUNTER — Telehealth: Payer: Self-pay

## 2017-12-24 MED ORDER — VITAMIN D (ERGOCALCIFEROL) 1.25 MG (50000 UNIT) PO CAPS: 50000.0000 [IU] | ORAL_CAPSULE | ORAL | 1 refills | Status: DC

## 2017-12-24 NOTE — Telephone Encounter (Signed)
Patient informed on lab result. [facility] [address] Changes had been made.    Patient verified DOB.

## 2017-12-24 NOTE — Telephone Encounter (Signed)
-----   Message from [name], NP sent at 12/23/2017 11:03 PM EST ----- Vitamin D is low. I have sent in a prescription to the pharmacy for vitamin D.  Tests show increased cholesterol/lipid levels. Please work on a low fat, heart healthy diet and participate in a regular aerobic exercise program and exercise at least 150 minutes per week.

## 2018-01-23 ENCOUNTER — Other Ambulatory Visit: Payer: Self-pay | Admitting: Pharmacist

## 2018-01-23 MED ORDER — INFLUENZA VAC SPLIT QUAD 0.5 ML IM SUSY: 0.5000 mL | PREFILLED_SYRINGE | Freq: Once | INTRAMUSCULAR | 0 refills | Status: AC

## 2018-01-23 MED FILL — FLUARIX QUADRIVALENT 0.5 ML: 0.5 | 1 days supply | Qty: 1 | Fill #0

## 2018-02-25 ENCOUNTER — Ambulatory Visit: Payer: BLUE CROSS/BLUE SHIELD | Attending: Family Medicine | Admitting: Physician Assistant

## 2018-02-25 ENCOUNTER — Other Ambulatory Visit: Payer: Self-pay | Admitting: Pharmacist

## 2018-02-25 VITALS — BP 133/80 | HR 93 | Temp 99.1°F | Resp 18 | Ht 69.0 in | Wt 144.0 lb

## 2018-02-25 DIAGNOSIS — L03113 Cellulitis of right upper limb: Secondary | ICD-10-CM | POA: Insufficient documentation

## 2018-02-25 DIAGNOSIS — M419 Scoliosis, unspecified: Secondary | ICD-10-CM | POA: Insufficient documentation

## 2018-02-25 DIAGNOSIS — M4302 Spondylolysis, cervical region: Secondary | ICD-10-CM | POA: Diagnosis not present

## 2018-02-25 DIAGNOSIS — L02413 Cutaneous abscess of right upper limb: Secondary | ICD-10-CM | POA: Insufficient documentation

## 2018-02-25 DIAGNOSIS — S51811A Laceration without foreign body of right forearm, initial encounter: Secondary | ICD-10-CM | POA: Diagnosis not present

## 2018-02-25 DIAGNOSIS — Z882 Allergy status to sulfonamides status: Secondary | ICD-10-CM | POA: Diagnosis not present

## 2018-02-25 DIAGNOSIS — Z79899 Other long term (current) drug therapy: Secondary | ICD-10-CM | POA: Insufficient documentation

## 2018-02-25 DIAGNOSIS — Z23 Encounter for immunization: Secondary | ICD-10-CM

## 2018-02-25 DIAGNOSIS — F329 Major depressive disorder, single episode, unspecified: Secondary | ICD-10-CM | POA: Diagnosis not present

## 2018-02-25 MED ORDER — DOXYCYCLINE HYCLATE 100 MG PO TABS: 100.0000 mg | ORAL_TABLET | Freq: Two times a day (BID) | ORAL | 0 refills | Status: DC

## 2018-02-25 MED ORDER — MUPIROCIN 2 % EX OINT: 1.0000 "application " | TOPICAL_OINTMENT | Freq: Two times a day (BID) | CUTANEOUS | 0 refills | Status: DC

## 2018-02-25 MED ORDER — TETANUS-DIPHTH-ACELL PERTUSSIS 5-2.5-18.5 LF-MCG/0.5 IM SUSP: 0.5000 mL | Freq: Once | INTRAMUSCULAR | Status: DC

## 2018-02-25 NOTE — Progress Notes (Signed)
Patient ID: Cynthia OfficerRonda L Best, female   DOB: 08/02/1956, 62 y.o.   MRN: [id number]   Cynthia Best, is a 62 y.o. female  [id number]  [id number]  DOB - 03/02/1956  Subjective:  Chief Complaint and HPI: Cynthia Best is a 62 y.o. female here today for abscess on R forearm.  Cut R forearm with a price tag and has picked at the area.  This occurred about 5 days ago and has gotten progressively worse. The area is warm and tender to the touch.    ROS:   Constitutional:  No f/c, No night sweats, No unexplained weight loss. EENT:  No vision changes, No blurry vision, No hearing changes. No mouth, throat, or ear problems.  Respiratory: No cough, No SOB Cardiac: No CP, no palpitations GI:  No abd pain, No N/V/D. GU: No Urinary s/sx Musculoskeletal: No joint pain Neuro: No headache, no dizziness, no motor weakness.  Skin: No rash Endocrine:  No polydipsia. No polyuria.  Psych: Denies SI/HI  No problems updated.  ALLERGIES: Allergies  Allergen Reactions  . Thimerosal Swelling    In eyes, due to thimerosal in contact solution   . Sulfa Antibiotics Other (See Comments)    Doesn't remember     PAST MEDICAL HISTORY: Past Medical History:  Diagnosis Date  . Arthritis   . Depression   . Herpes   . Scoliosis   . Spondylolysis of cervical region     MEDICATIONS AT HOME: Prior to Admission medications   Medication Sig Start Date End Date Taking? Authorizing Provider  albuterol (PROVENTIL HFA;VENTOLIN HFA) 108 (90 Base) MCG/ACT inhaler Inhale 2 puffs into the lungs every 4 (four) hours as needed for wheezing or shortness of breath. 10/20/17  Yes Claiborne RiggFleming, Zelda W, NP  amphetamine-dextroamphetamine (ADDERALL) 20 MG tablet Take 20 mg by mouth 2 (two) times daily.   Yes [provider]  FLUoxetine (PROZAC) 20 MG capsule Take 20 mg by mouth daily.   Yes [provider]  FLUoxetine (PROZAC) 40 MG capsule Take 40 mg by mouth daily.   Yes [provider]  hydrocortisone 2.5  % cream Apply topically 2 (two) times daily. 11/11/17  Yes Hairston, Oren BeckmannMandesia R, FNP  QUEtiapine (SEROQUEL XR) 200 MG 24 hr tablet Take 100 mg by mouth at bedtime.   Yes [provider]  QUEtiapine (SEROQUEL) 100 MG tablet Take 50 mg by mouth at bedtime. Take 1 and 1/2    Yes [provider]  rOPINIRole (REQUIP) 1 MG tablet Take 3 tablets (3 mg total) by mouth at bedtime. 12/12/17 03/12/18 Yes Claiborne RiggFleming, Zelda W, NP  Spacer/Aero-Holding Chambers (AEROCHAMBER PLUS WITH MASK) inhaler Use as instructed 06/21/17  Yes Muthersbaugh, Dahlia ClientHannah, PA-C  triamcinolone cream (KENALOG) 0.1 % Apply 1 application topically 2 (two) times daily. 12/12/17  Yes Claiborne RiggFleming, Zelda W, NP  Vitamin D, Ergocalciferol, (DRISDOL) 50000 units CAPS capsule Take 1 capsule (50,000 Units total) by mouth every 7 (seven) days. 12/24/17  Yes Claiborne RiggFleming, Zelda W, NP  doxycycline (VIBRA-TABS) 100 MG tablet Take 1 tablet (100 mg total) by mouth 2 (two) times daily. 02/25/18   Anders SimmondsMcClung, Angela M, PA-C  mupirocin ointment (BACTROBAN) 2 % Place 1 application into the nose 2 (two) times daily. 02/25/18   Anders SimmondsMcClung, Angela M, PA-C     Objective:  EXAM:   Vitals:   02/25/18 1005  BP: 133/80  Pulse: 93  Resp: 18  Temp: 99.1 F (37.3 C)  TempSrc: Oral  SpO2: 97%  Weight: 144 lb (65.3  kg)  Height: 5\' 9"  (1.753 m)    General appearance : A&OX3. NAD. Non-toxic-appearing HEENT: Atraumatic and Normocephalic.  PERRLA. EOM intact.   Neck: supple, no JVD. No cervical lymphadenopathy. No thyromegaly Chest/Lungs:  Breathing-non-labored, Good air entry bilaterally, breath sounds normal without rales, rhonchi, or wheezing  CVS: S1 S2 regular, no murmurs, gallops, rubs  Extremities: Bilateral Lower Ext shows no edema, both legs are warm to touch with = pulse throughout.  R arm dorsal surface about midway there is ~1cm area that is erythematous and TTP. with no fluctuance or induration.  No streaking.   Neurology:  CN II-XII grossly intact, Non  focal.   Psych:  TP linear. J/I WNL. Normal speech. Appropriate eye contact and affect.  Skin:  No Rash  Data Review Lab Results  Component Value Date   HGBA1C 5.8 (H) 03/28/2015     Assessment & Plan   1. Cellulitis of right arm Gave ACE wrap and non-stick pads given - doxycycline (VIBRA-TABS) 100 MG tablet; Take 1 tablet (100 mg total) by mouth 2 (two) times daily.  Dispense: 20 tablet; Refill: 0 - mupirocin ointment (BACTROBAN) 2 %; Place 1 application into the nose 2 (two) times daily.  Dispense: 22 g; Refill: 0  Patient have been counseled extensively about nutrition and exercise  Return if symptoms worsen or fail to improve.  The patient was given clear instructions to go to ER or return to medical center if symptoms don't improve, worsen or new problems develop. The patient verbalized understanding. The patient was told to call to get lab results if they haven't heard anything in the next week.     Cynthia Co, PA-C [facility] [place], [place] [phone]   02/25/2018, 10:10 AM

## 2018-02-25 NOTE — Patient Instructions (Signed)

## 2018-04-13 ENCOUNTER — Ambulatory Visit: Payer: Self-pay | Attending: Nurse Practitioner | Admitting: Nurse Practitioner

## 2018-04-13 ENCOUNTER — Encounter: Payer: Self-pay | Admitting: Nurse Practitioner

## 2018-04-13 ENCOUNTER — Ambulatory Visit (HOSPITAL_COMMUNITY)
Admission: RE | Admit: 2018-04-13 | Discharge: 2018-04-13 | Disposition: A | Discharge status: Home / Self Care | Payer: Self-pay | Source: Ambulatory Visit | Attending: Nurse Practitioner | Admitting: Nurse Practitioner

## 2018-04-13 VITALS — BP 157/97 | HR 96 | Temp 99.7°F | Resp 16 | Ht 69.0 in | Wt 146.0 lb

## 2018-04-13 DIAGNOSIS — M4302 Spondylolysis, cervical region: Secondary | ICD-10-CM | POA: Insufficient documentation

## 2018-04-13 DIAGNOSIS — R0981 Nasal congestion: Secondary | ICD-10-CM | POA: Insufficient documentation

## 2018-04-13 DIAGNOSIS — Z760 Encounter for issue of repeat prescription: Secondary | ICD-10-CM | POA: Insufficient documentation

## 2018-04-13 DIAGNOSIS — M419 Scoliosis, unspecified: Secondary | ICD-10-CM | POA: Insufficient documentation

## 2018-04-13 DIAGNOSIS — J449 Chronic obstructive pulmonary disease, unspecified: Secondary | ICD-10-CM | POA: Insufficient documentation

## 2018-04-13 DIAGNOSIS — R059 Cough, unspecified: Secondary | ICD-10-CM

## 2018-04-13 DIAGNOSIS — Z882 Allergy status to sulfonamides status: Secondary | ICD-10-CM | POA: Insufficient documentation

## 2018-04-13 DIAGNOSIS — F329 Major depressive disorder, single episode, unspecified: Secondary | ICD-10-CM | POA: Insufficient documentation

## 2018-04-13 DIAGNOSIS — Z79899 Other long term (current) drug therapy: Secondary | ICD-10-CM | POA: Insufficient documentation

## 2018-04-13 DIAGNOSIS — R05 Cough: Secondary | ICD-10-CM | POA: Insufficient documentation

## 2018-04-13 DIAGNOSIS — F419 Anxiety disorder, unspecified: Secondary | ICD-10-CM | POA: Insufficient documentation

## 2018-04-13 DIAGNOSIS — R509 Fever, unspecified: Secondary | ICD-10-CM | POA: Insufficient documentation

## 2018-04-13 MED ORDER — BENZONATATE 200 MG PO CAPS: 200.0000 mg | ORAL_CAPSULE | Freq: Two times a day (BID) | ORAL | 0 refills | Status: DC | PRN

## 2018-04-13 MED ORDER — BUSPIRONE HCL 15 MG PO TABS: 15.0000 mg | ORAL_TABLET | Freq: Three times a day (TID) | ORAL | 0 refills | Status: AC

## 2018-04-13 MED ORDER — FLUTICASONE PROPIONATE 50 MCG/ACT NA SUSP: 2.0000 | Freq: Every day | NASAL | 6 refills | Status: DC

## 2018-04-13 NOTE — Patient Instructions (Signed)

## 2018-04-13 NOTE — Progress Notes (Signed)
Assessment & Plan:  Cynthia Best was seen today for nasal congestion and medication refill.  Diagnoses and all orders for this visit:  Cough in adult patient -     CBC -     DG Chest 2 View; Future  Chronic obstructive bronchitis (HCC) -     DG Chest 2 View; Future  Anxiety -     busPIRone (BUSPAR) 15 MG tablet; Take 1 tablet (15 mg total) by mouth 3 (three) times daily.    Patient has been counseled on age-appropriate routine health concerns for screening and prevention. These are reviewed and up-to-date. Referrals have been placed accordingly. Immunizations are up-to-date or declined.    Subjective:   Chief Complaint  Patient presents with  . Nasal Congestion    Pt. having sweating, headaches, sneezing with mucus, rattling cough, coughing can hear wheezing since Thursday.   . Medication Refill   HPI Cynthia Best 62 y.o. female presents to office today with complaints of upper respiratory symptoms. She is concerned that she may have recurrent pneumonia.  She was 06/20/2017 with community-acquired pneumonia versus atelectasis noted on chest x-ray.  URI Onset of symptoms 4 days ago.  Symptoms include: Sore throat, headaches bilateral ear pressure, diaphoresis, she has not checked her temperature at home, productive cough, nasal congestion with green drainage. She continues to smoke. She denies being around any other sick contacts.    Anxiety Patient complains of anxiety disorder.  She has the following symptoms: difficulty concentrating, feelings of losing control, palpitations, racing thoughts, shortness of breath. Onset of symptoms; chronic gradually worsening over the past few day. She denies current suicidal and homicidal ideation.  Risk factors: previous episode of depression Previous treatment includes Prozac and Selective Seratonin Reuptake Inhibitor seroquel and individual therapy.  She complains of the following side effects from the treatment: none. Depression screen [facility] 2/9  04/13/2018  Decreased Interest 0  Down, Depressed, Hopeless 0  PHQ - 2 Score 0  Altered sleeping 1  Tired, decreased energy 1  Change in appetite 0  Feeling bad or failure about yourself  0  Trouble concentrating 0  Moving slowly or fidgety/restless 0  Suicidal thoughts 0  PHQ-9 Score 2    Review of Systems  Constitutional: Positive for chills, diaphoresis, fever and malaise/fatigue.  HENT: Positive for congestion, ear pain (bilateral ), sinus pain and sore throat. Negative for ear discharge and hearing loss.   Eyes: Negative.   Respiratory: Positive for cough, shortness of breath and wheezing. Negative for sputum production.   Cardiovascular: Negative.  Negative for chest pain, orthopnea and leg swelling.  Gastrointestinal: Negative.  Negative for abdominal pain, diarrhea, nausea and vomiting.  Skin: Negative for itching and rash (right forearm).  Neurological: Positive for headaches. Negative for dizziness and focal weakness.  Endo/Heme/Allergies: Negative for environmental allergies.  Psychiatric/Behavioral: Positive for depression. Negative for suicidal ideas. The patient is nervous/anxious.     Past Medical History:  Diagnosis Date  . Arthritis   . Depression   . Herpes   . Scoliosis   . Spondylolysis of cervical region     Past Surgical History:  Procedure Laterality Date  . ABDOMINAL HYSTERECTOMY  01/14/2006  . APPENDECTOMY  01/14/2005  . Myoectomy    . PLACEMENT OF BREAST IMPLANTS    . VAGINAL PROLAPSE REPAIR  04/14/2006    Family History  Problem Relation Age of Onset  . Stroke Mother   . Heart disease Father   . Hyperlipidemia Brother   .  Diabetes Maternal Grandmother     Social History Reviewed with no changes to be made today.   Outpatient Medications Prior to Visit  Medication Sig Dispense Refill  . amphetamine-dextroamphetamine (ADDERALL) 20 MG tablet Take 20 mg by mouth 2 (two) times daily.    Marland Kitchen FLUoxetine (PROZAC) 20 MG capsule Take 20 mg by mouth  daily.    Marland Kitchen FLUoxetine (PROZAC) 40 MG capsule Take 40 mg by mouth daily.    . mupirocin ointment (BACTROBAN) 2 % Place 1 application into the nose 2 (two) times daily. 22 g 0  . QUEtiapine (SEROQUEL XR) 200 MG 24 hr tablet Take 100 mg by mouth at bedtime.    Marland Kitchen QUEtiapine (SEROQUEL) 100 MG tablet Take 50 mg by mouth at bedtime. Take 1 and 1/2     . triamcinolone cream (KENALOG) 0.1 % Apply 1 application topically 2 (two) times daily. 30 g 1  . Vitamin D, Ergocalciferol, (DRISDOL) 50000 units CAPS capsule Take 1 capsule (50,000 Units total) by mouth every 7 (seven) days. 12 capsule 1  . albuterol (PROVENTIL HFA;VENTOLIN HFA) 108 (90 Base) MCG/ACT inhaler Inhale 2 puffs into the lungs every 4 (four) hours as needed for wheezing or shortness of breath. (Patient not taking: Reported on 04/13/2018) 1 Inhaler 3  . hydrocortisone 2.5 % cream Apply topically 2 (two) times daily. (Patient not taking: Reported on 04/13/2018) 30 g 0  . rOPINIRole (REQUIP) 1 MG tablet Take 3 tablets (3 mg total) by mouth at bedtime. 90 tablet 3  . Spacer/Aero-Holding Chambers (AEROCHAMBER PLUS WITH MASK) inhaler Use as instructed (Patient not taking: Reported on 04/13/2018) 1 each 2  . doxycycline (VIBRA-TABS) 100 MG tablet Take 1 tablet (100 mg total) by mouth 2 (two) times daily. (Patient not taking: Reported on 04/13/2018) 20 tablet 0   No facility-administered medications prior to visit.     Allergies  Allergen Reactions  . Thimerosal Swelling    In eyes, due to thimerosal in contact solution   . Sulfa Antibiotics Other (See Comments)    Doesn't remember        Objective:    BP (!) 157/97 (BP Location: Left Arm, Patient Position: Sitting, Cuff Size: Normal)   Pulse 96   Temp 99.7 F (37.6 C) (Oral)   Resp 16   Ht 5\' 9"  (1.753 m)   Wt 146 lb (66.2 kg)   SpO2 96%   BMI 21.56 kg/m  Wt Readings from Last 3 Encounters:  04/13/18 146 lb (66.2 kg)  02/25/18 144 lb (65.3 kg)  12/12/17 144 lb (65.3 kg)     Physical Exam  Constitutional: She is oriented to person, place, and time. She appears well-developed and well-nourished. She appears ill.  HENT:  Head: Normocephalic.  Right Ear: Hearing, external ear and ear canal normal. A middle ear effusion is present.  Left Ear: Hearing, external ear and ear canal normal. A middle ear effusion is present.  Nose: Mucosal edema and rhinorrhea present. Right sinus exhibits no maxillary sinus tenderness and no frontal sinus tenderness. Left sinus exhibits no maxillary sinus tenderness and no frontal sinus tenderness.  Mouth/Throat: Mucous membranes are normal. Posterior oropharyngeal erythema present. No oropharyngeal exudate, posterior oropharyngeal edema or tonsillar abscesses.  Eyes: EOM are normal.  Neck: Normal range of motion. No thyromegaly present.  Cardiovascular: Normal rate and regular rhythm. Exam reveals no gallop and no friction rub.  No murmur heard. Pulmonary/Chest: Effort normal. Tachypnea noted. No respiratory distress. She has wheezes in the right upper  field, the right middle field and the right lower field. She has rhonchi. She has no rales. She exhibits no tenderness.  Abdominal: Soft. Bowel sounds are normal.  Musculoskeletal: Normal range of motion.  Lymphadenopathy:    She has no cervical adenopathy.  Neurological: She is alert and oriented to person, place, and time.  Skin: Skin is warm and dry.  Psychiatric: Her speech is normal and behavior is normal. Judgment and thought content normal. Her mood appears anxious. Cognition and memory are normal. She expresses no homicidal and no suicidal ideation. She expresses no suicidal plans and no homicidal plans.        Patient has been counseled extensively about nutrition and exercise as well as the importance of adherence with medications and regular follow-up. The patient was given clear instructions to go to ER or return to medical center if symptoms don't improve, worsen or new  problems develop. The patient verbalized understanding.   Follow-up: Return if symptoms worsen or fail to improve.   Claiborne RiggZelda W Karina Nofsinger, FNP-BC [facility] [place], [place] [phone]   04/13/2018, 1:47 PM

## 2018-04-14 ENCOUNTER — Telehealth: Payer: Self-pay

## 2018-04-14 DIAGNOSIS — J449 Chronic obstructive pulmonary disease, unspecified: Secondary | ICD-10-CM

## 2018-04-14 LAB — CBC
HEMATOCRIT: 42.7 % (ref 34.0–46.6)
HEMOGLOBIN: 14.4 g/dL (ref 11.1–15.9)
MCH: 32.7 pg (ref 26.6–33.0)
MCHC: 33.7 g/dL (ref 31.5–35.7)
MCV: 97 fL (ref 79–97)
Platelets: 336 10*3/uL (ref 150–379)
RBC: 4.4 x10E6/uL (ref 3.77–5.28)
RDW: 13.5 % (ref 12.3–15.4)
WBC: 7.2 10*3/uL (ref 3.4–10.8)

## 2018-04-14 MED ORDER — ALBUTEROL SULFATE HFA 108 (90 BASE) MCG/ACT IN AERS: 2.0000 | INHALATION_SPRAY | RESPIRATORY_TRACT | 3 refills | Status: DC | PRN

## 2018-04-14 NOTE — Telephone Encounter (Signed)
-----   Message from [name], NP sent at 04/13/2018  1:54 PM EDT ----- Please let patient know cxray results are normal. There is no PNA Will send in prescription for nasal spray and cough medicine. She needs to make sure she picks up her albuterol inhaler as well

## 2018-04-14 NOTE — Telephone Encounter (Signed)
CMA called patient to inform regarding results. Patient understood.  CMA sent a new order of albuterol inhaler for patient to [facility] on [address].

## 2018-04-17 ENCOUNTER — Telehealth: Payer: Self-pay

## 2018-04-17 NOTE — Telephone Encounter (Signed)
CMA attempt to call patient to inform on lab results.  No answer and left a VM for patient to call back.  If patient call back, please inform:  Labs are normal at this time. No anemia or elevated White blood count for infection

## 2018-04-17 NOTE — Progress Notes (Signed)
Patient return call. CMA inform patient on lab results. Patient understood.

## 2018-04-17 NOTE — Telephone Encounter (Signed)
-----   Message from [name], NP sent at 04/17/2018  1:14 AM EDT ----- Labs are normal at this time. No anemia or elevated White blood count for infection

## 2018-04-26 ENCOUNTER — Other Ambulatory Visit: Payer: Self-pay | Admitting: Nurse Practitioner

## 2018-04-26 DIAGNOSIS — G2581 Restless legs syndrome: Secondary | ICD-10-CM

## 2018-05-29 ENCOUNTER — Other Ambulatory Visit: Payer: Self-pay | Admitting: Nurse Practitioner

## 2018-05-29 DIAGNOSIS — G2581 Restless legs syndrome: Secondary | ICD-10-CM

## 2018-07-02 ENCOUNTER — Other Ambulatory Visit: Payer: Self-pay | Admitting: Nurse Practitioner

## 2018-07-02 DIAGNOSIS — G2581 Restless legs syndrome: Secondary | ICD-10-CM

## 2018-07-03 ENCOUNTER — Other Ambulatory Visit: Payer: Self-pay

## 2018-07-03 ENCOUNTER — Other Ambulatory Visit: Payer: Self-pay | Admitting: Family Medicine

## 2018-07-03 ENCOUNTER — Telehealth: Payer: Self-pay | Admitting: Nurse Practitioner

## 2018-07-03 DIAGNOSIS — G2581 Restless legs syndrome: Secondary | ICD-10-CM

## 2018-07-03 IMAGING — CR DG CHEST 2V
2 series · 2 of 2 positions shown · non-contrast
Comparison: Radiographs 06/20/2017 and 03/07/2015.  CT 06/12/2015.

CLINICAL DATA: Cough and fever for 4 days. Smoker with history of
heart murmur.

EXAM:
CHEST - 2 VIEW

[w chest pa]
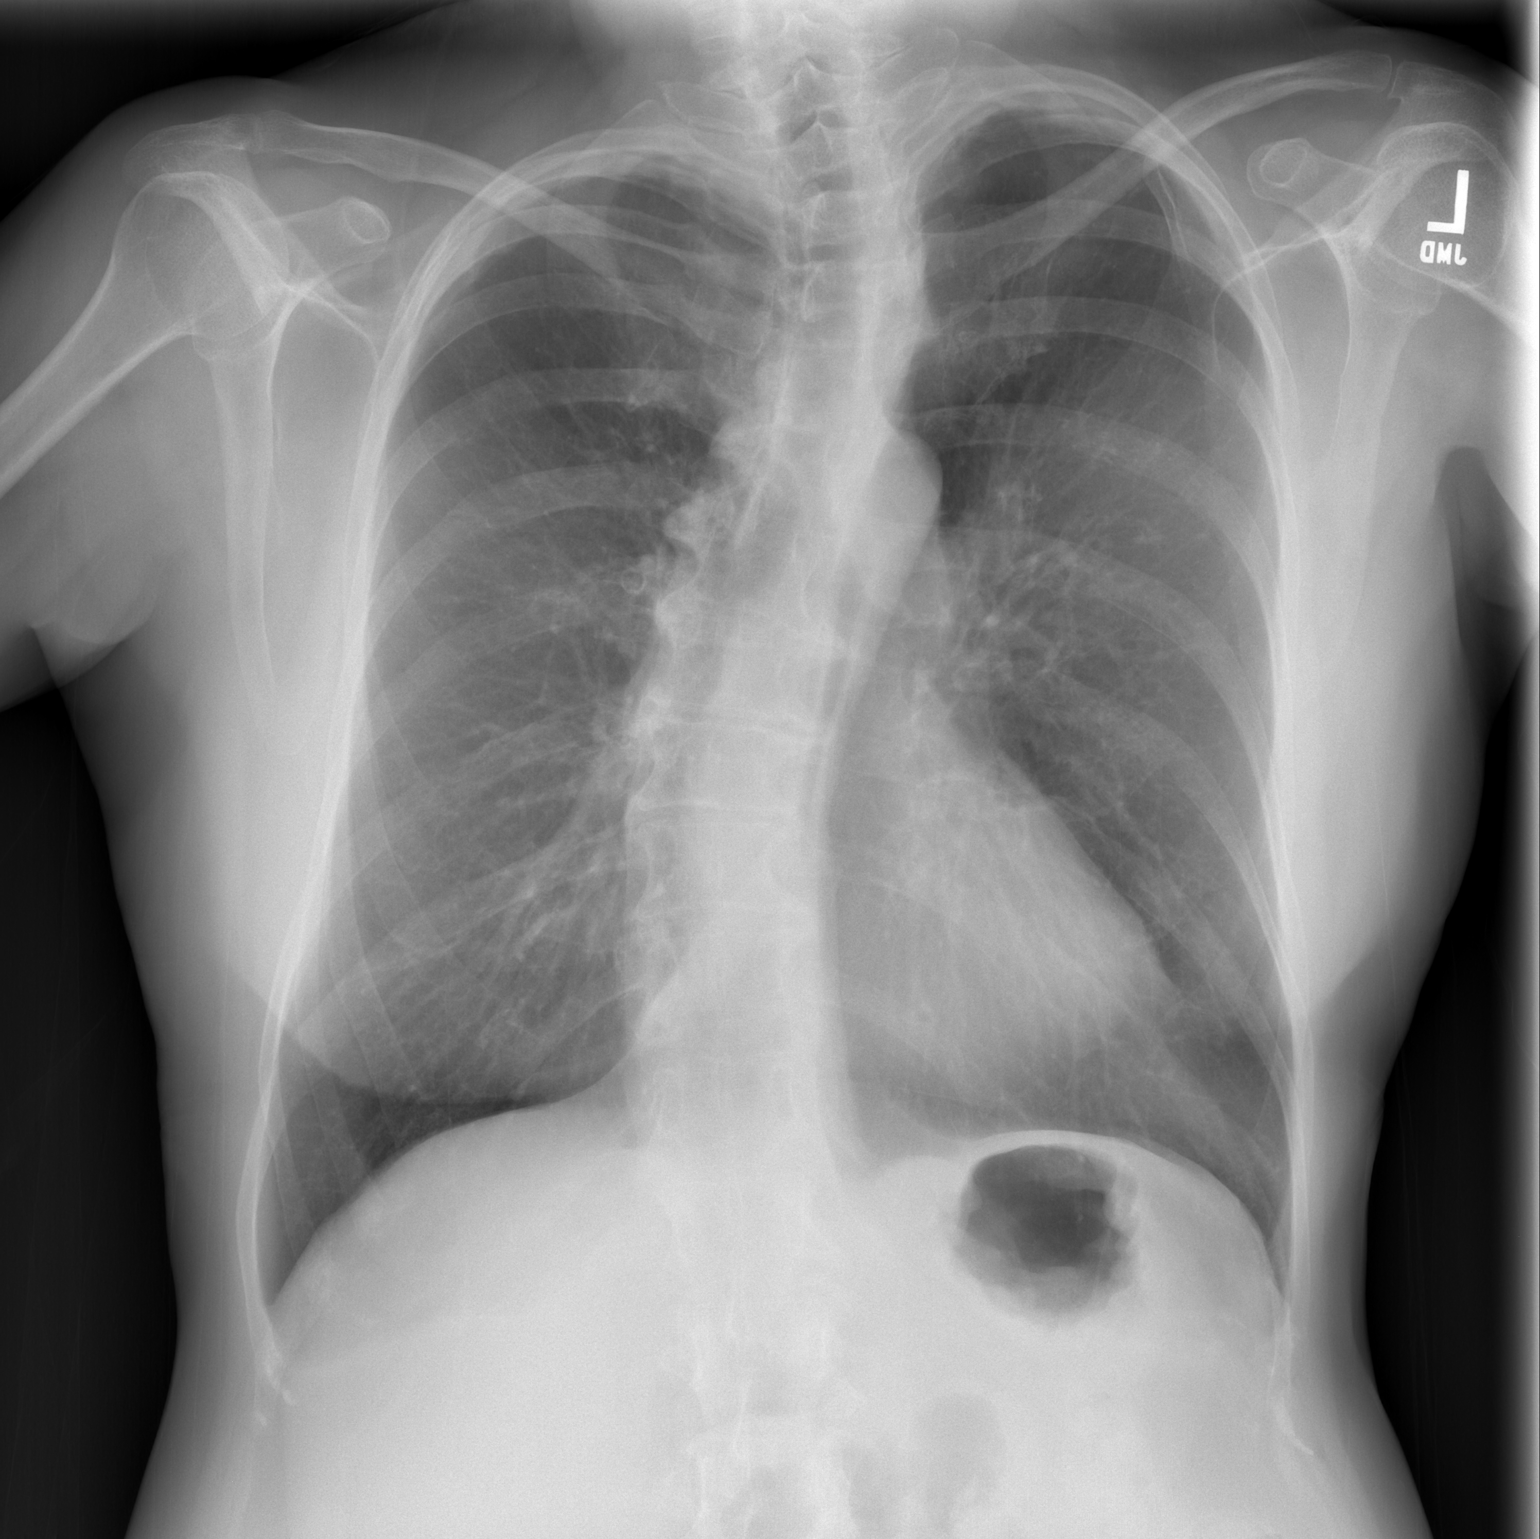

[w chest lat]
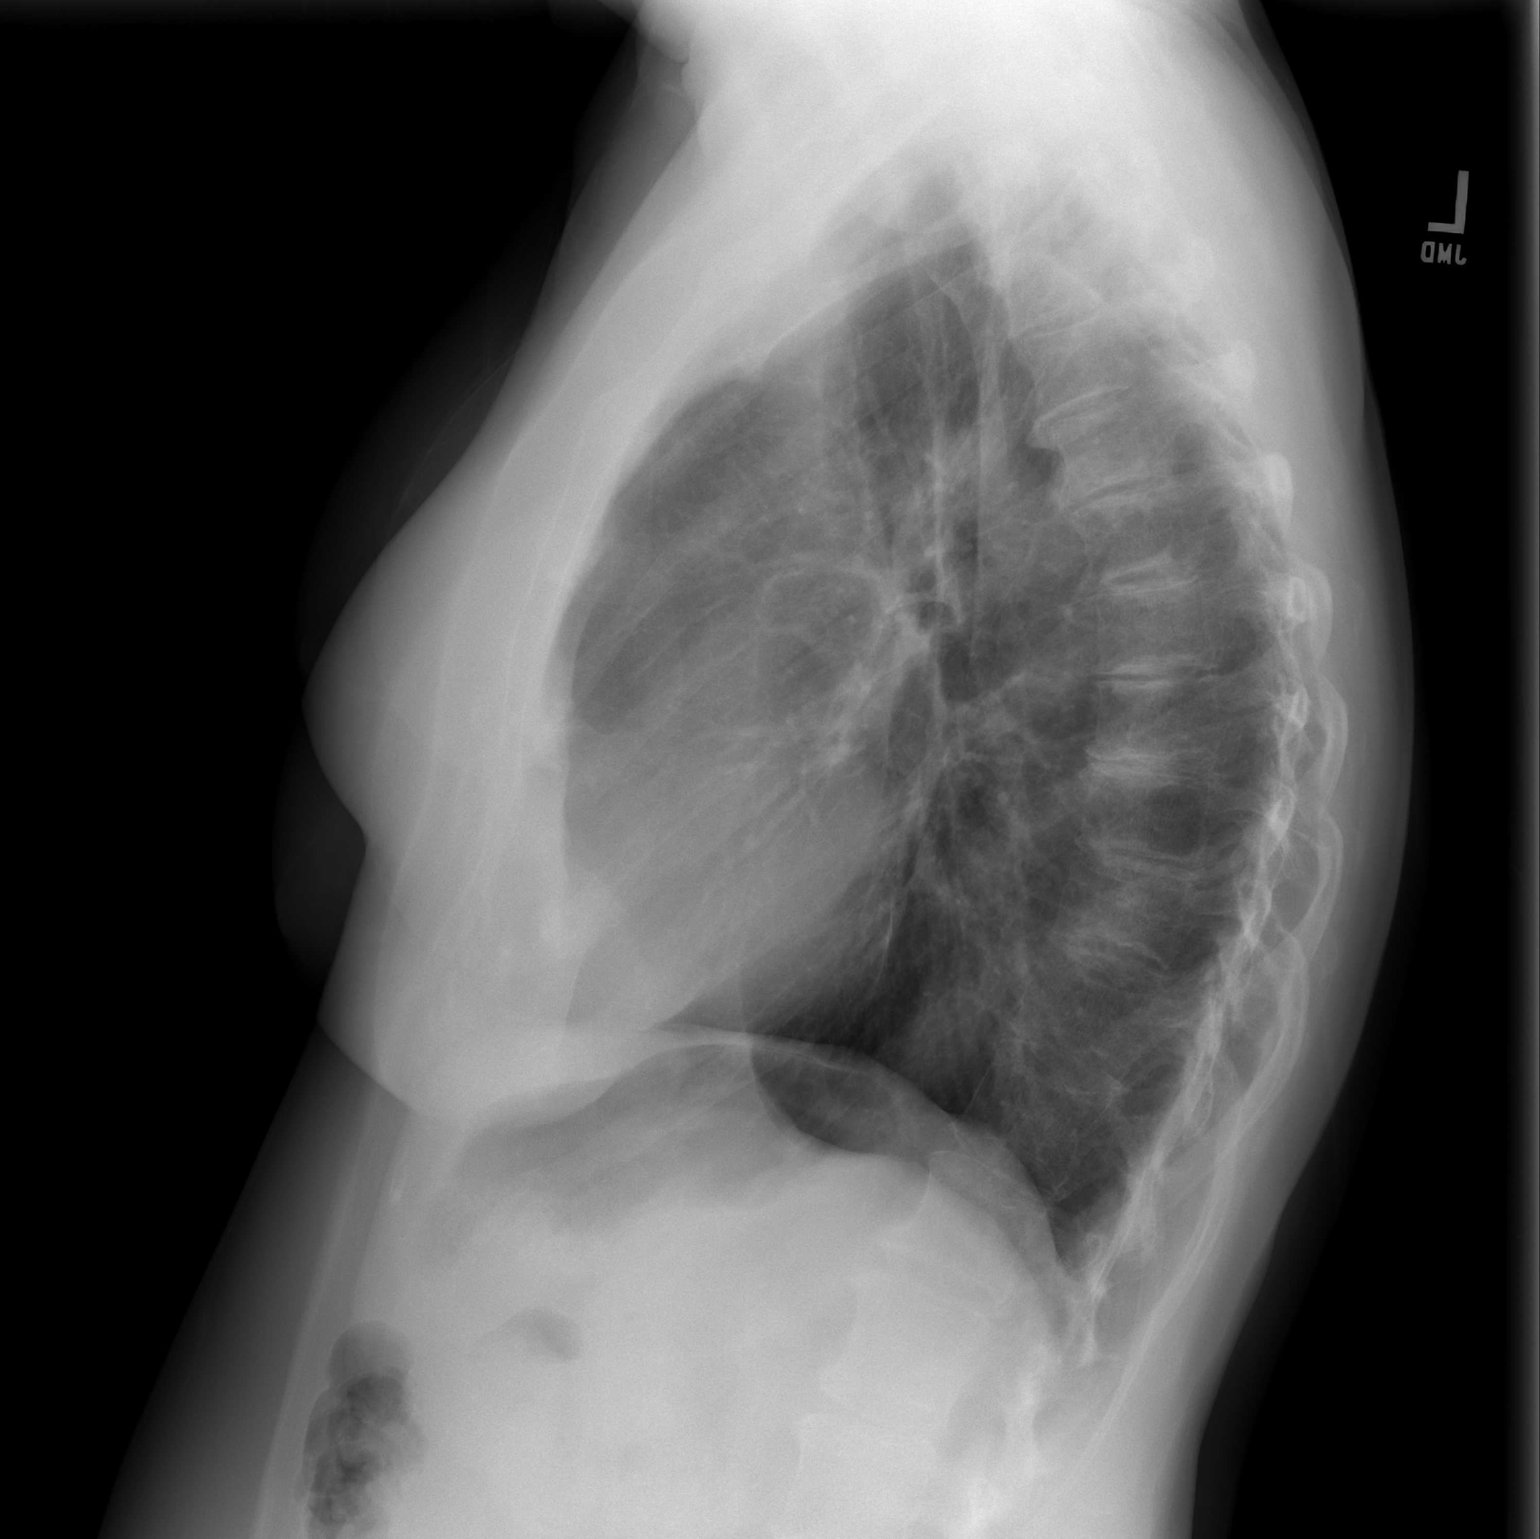

[2 of 2 positions shown; findings below may reference images not displayed]

FINDINGS: The heart size and mediastinal contours are stable. The lungs appear
clear. There is no residual right perihilar density. There is no
pleural effusion or pneumothorax. Thoracolumbar scoliosis and
associated spondylosis appear unchanged.
IMPRESSION: No active cardiopulmonary process.  Scoliosis.

## 2018-07-03 MED ORDER — ROPINIROLE HCL 1 MG PO TABS: 3.0000 mg | ORAL_TABLET | Freq: Every day | ORAL | 0 refills | Status: DC

## 2018-07-03 NOTE — Telephone Encounter (Signed)
[facility] the refill that was made today. Medication is discontinued until notified by PCP to address it.

## 2018-07-03 NOTE — Telephone Encounter (Signed)
Already sent request to her pcp, waiting for them to address it

## 2018-07-03 NOTE — Telephone Encounter (Signed)
Pt called to request a medication refill  -rOPINIRole (REQUIP) 1 MG tablet  To -[address]-[address], [place], [place] [address] [phone] Please follow up as soon as possible because patient states she cant sleep at night.

## 2018-07-12 NOTE — Telephone Encounter (Signed)
It appears that has been addressed

## 2018-07-30 ENCOUNTER — Other Ambulatory Visit: Payer: Self-pay | Admitting: Nurse Practitioner

## 2018-07-30 DIAGNOSIS — G2581 Restless legs syndrome: Secondary | ICD-10-CM

## 2018-07-30 NOTE — Telephone Encounter (Signed)
Patient called requesting a refill on her rOPINIRole (REQUIP) 1 MG tablet  Patient would like the medication [facility], [address] [place], [place], [place] [id number] Please f/u  Please call pt and LVM

## 2018-07-31 ENCOUNTER — Other Ambulatory Visit: Payer: Self-pay | Admitting: Nurse Practitioner

## 2018-07-31 NOTE — Telephone Encounter (Signed)
If patient calls back PCP informed me via skype that pt is not due for a refill on -rOPINIRole (REQUIP) 1 MG tablet  until on 08-03-2018

## 2018-07-31 NOTE — Telephone Encounter (Signed)
Pt called to request a medication refill  -rOPINIRole (REQUIP) 1 MG tablet  [facility], [place], [place] [address] XBJYN:([phone] Please follow up as soon as possible because patient states she cant sleep at night.

## 2018-08-02 ENCOUNTER — Other Ambulatory Visit: Payer: Self-pay | Admitting: Family Medicine

## 2018-08-02 DIAGNOSIS — G2581 Restless legs syndrome: Secondary | ICD-10-CM

## 2018-08-03 ENCOUNTER — Other Ambulatory Visit: Payer: Self-pay | Admitting: Nurse Practitioner

## 2018-08-03 DIAGNOSIS — G2581 Restless legs syndrome: Secondary | ICD-10-CM

## 2018-08-03 MED ORDER — ROPINIROLE HCL 1 MG PO TABS: 3.0000 mg | ORAL_TABLET | Freq: Every day | ORAL | 0 refills | Status: DC

## 2018-08-26 ENCOUNTER — Other Ambulatory Visit: Payer: Self-pay | Admitting: Nurse Practitioner

## 2018-08-26 DIAGNOSIS — G2581 Restless legs syndrome: Secondary | ICD-10-CM

## 2018-08-31 ENCOUNTER — Other Ambulatory Visit: Payer: Self-pay | Admitting: Nurse Practitioner

## 2018-08-31 DIAGNOSIS — G2581 Restless legs syndrome: Secondary | ICD-10-CM

## 2018-08-31 NOTE — Telephone Encounter (Signed)
Per PCP patient is to receive one month supply no refills each month the pharmacy can send a request for refill until her appt in December.

## 2018-09-29 ENCOUNTER — Other Ambulatory Visit: Payer: Self-pay | Admitting: Nurse Practitioner

## 2018-09-29 DIAGNOSIS — G2581 Restless legs syndrome: Secondary | ICD-10-CM

## 2018-11-02 ENCOUNTER — Other Ambulatory Visit: Payer: Self-pay | Admitting: Family Medicine

## 2018-11-02 DIAGNOSIS — G2581 Restless legs syndrome: Secondary | ICD-10-CM

## 2018-12-01 ENCOUNTER — Other Ambulatory Visit: Payer: Self-pay | Admitting: Nurse Practitioner

## 2018-12-01 DIAGNOSIS — G2581 Restless legs syndrome: Secondary | ICD-10-CM

## 2018-12-11 ENCOUNTER — Ambulatory Visit: Payer: Self-pay | Attending: Nurse Practitioner | Admitting: Nurse Practitioner

## 2018-12-11 ENCOUNTER — Other Ambulatory Visit: Payer: Self-pay

## 2018-12-11 ENCOUNTER — Encounter: Payer: Self-pay | Admitting: Nurse Practitioner

## 2018-12-11 VITALS — BP 132/92 | HR 96 | Temp 98.9°F | Ht 69.0 in | Wt 154.0 lb

## 2018-12-11 DIAGNOSIS — E559 Vitamin D deficiency, unspecified: Secondary | ICD-10-CM

## 2018-12-11 DIAGNOSIS — M5412 Radiculopathy, cervical region: Secondary | ICD-10-CM

## 2018-12-11 DIAGNOSIS — A6004 Herpesviral vulvovaginitis: Secondary | ICD-10-CM

## 2018-12-11 DIAGNOSIS — Z0001 Encounter for general adult medical examination with abnormal findings: Secondary | ICD-10-CM

## 2018-12-11 DIAGNOSIS — Z0000 Encounter for general adult medical examination without abnormal findings: Secondary | ICD-10-CM

## 2018-12-11 DIAGNOSIS — G2581 Restless legs syndrome: Secondary | ICD-10-CM

## 2018-12-11 MED ORDER — VALACYCLOVIR HCL 500 MG PO TABS: 500.0000 mg | ORAL_TABLET | Freq: Every day | ORAL | 3 refills | Status: AC

## 2018-12-11 MED ORDER — GABAPENTIN 100 MG PO CAPS: 100.0000 mg | ORAL_CAPSULE | Freq: Three times a day (TID) | ORAL | 3 refills | Status: DC

## 2018-12-11 MED ORDER — ROPINIROLE HCL 3 MG PO TABS
3.0000 mg | ORAL_TABLET | Freq: Every day | ORAL | 1 refills | Status: DC
Start: 2019-01-02 — End: 2019-02-26

## 2018-12-11 NOTE — Progress Notes (Signed)
Assessment & Plan:  Cynthia Best was seen today for annual exam.  Diagnoses and all orders for this visit:  Well woman exam without gynecological exam -     CMP14+EGFR -     CBC -     Lipid panel  Vitamin D deficiency disease -     VITAMIN D 25 Hydroxy (Vit-D Deficiency, Fractures)  Cervical radicular pain -     gabapentin (NEURONTIN) 100 MG capsule; Take 1 capsule (100 mg total) by mouth 3 (three) times daily. May alternate with heat and ice application for pain relief. May also alternate with acetaminophen and Ibuprofen as prescribed pain relief. Other alternatives include massage, acupuncture and water aerobics.   Herpes simplex vulvovaginitis -     valACYclovir (VALTREX) 500 MG tablet; Take 1 tablet (500 mg total) by mouth daily. Chronic. Patient requests for suppressive therapy.   Restless leg syndrome -     rOPINIRole (REQUIP) 3 MG tablet; Take 1 tablet (3 mg total) by mouth at bedtime. Chronic and well controlled with Requip.   Patient has been counseled on age-appropriate routine health concerns for screening and prevention. These are reviewed and up-to-date. Referrals have been placed accordingly. Immunizations are up-to-date or declined.    Subjective:   Chief Complaint  Patient presents with  . Annual Exam   HPI Cynthia Best 62 y.o. female presents to office today for annual physical. Most recent eye exam was September 2019. She denies any cataracts or glaucoma. She is s/p hysterectomy over 20 years ago.   Review of Systems  Constitutional: Negative.  Negative for chills, fever, malaise/fatigue and weight loss.  HENT: Negative.  Negative for congestion, hearing loss, sinus pain and sore throat.   Eyes: Negative.  Negative for blurred vision, double vision, photophobia and pain.  Respiratory: Negative.  Negative for cough, sputum production, shortness of breath and wheezing.   Cardiovascular: Negative.  Negative for chest pain and leg swelling.  Gastrointestinal:  Negative.  Negative for abdominal pain, constipation, diarrhea, heartburn, nausea and vomiting.  Genitourinary: Negative.   Musculoskeletal: Positive for neck pain. Negative for joint pain and myalgias.  Skin: Negative.  Negative for rash.  Neurological: Positive for tremors (RLS). Negative for dizziness, speech change, focal weakness, seizures and headaches.  Endo/Heme/Allergies: Negative.  Negative for environmental allergies.  Psychiatric/Behavioral: Positive for depression. Negative for suicidal ideas. The patient is nervous/anxious. The patient does not have insomnia.     Past Medical History:  Diagnosis Date  . Arthritis   . Depression   . Herpes   . Scoliosis   . Spondylolysis of cervical region     Past Surgical History:  Procedure Laterality Date  . ABDOMINAL HYSTERECTOMY  01/14/2006  . APPENDECTOMY  01/14/2005  . Myoectomy    . PLACEMENT OF BREAST IMPLANTS    . VAGINAL PROLAPSE REPAIR  04/14/2006    Family History  Problem Relation Age of Onset  . Stroke Mother   . Heart disease Father   . Hyperlipidemia Brother   . Diabetes Maternal Grandmother     Social History Reviewed with no changes to be made today.   Outpatient Medications Prior to Visit  Medication Sig Dispense Refill  . FLUoxetine (PROZAC) 20 MG capsule Take 20 mg by mouth daily.    Marland Kitchen rOPINIRole (REQUIP) 1 MG tablet TAKE THREE TABLETS BY MOUTH EVERY NIGHT AT BEDTIME 90 tablet 0  . albuterol (PROVENTIL HFA;VENTOLIN HFA) 108 (90 Base) MCG/ACT inhaler Inhale 2 puffs into the lungs every 4 (four)  hours as needed for wheezing or shortness of breath. 1 Inhaler 3  . amphetamine-dextroamphetamine (ADDERALL) 20 MG tablet Take 20 mg by mouth 2 (two) times daily.    Marland Kitchen FLUoxetine (PROZAC) 40 MG capsule Take 40 mg by mouth daily.    . QUEtiapine (SEROQUEL XR) 200 MG 24 hr tablet Take 100 mg by mouth at bedtime.    Marland Kitchen QUEtiapine (SEROQUEL) 100 MG tablet Take 50 mg by mouth at bedtime. Take 1 and 1/2     .  Spacer/Aero-Holding Chambers (AEROCHAMBER PLUS WITH MASK) inhaler Use as instructed (Patient not taking: Reported on 04/13/2018) 1 each 2  . benzonatate (TESSALON) 200 MG capsule Take 1 capsule (200 mg total) by mouth 2 (two) times daily as needed for cough. 20 capsule 0  . fluticasone (FLONASE) 50 MCG/ACT nasal spray Place 2 sprays into both nostrils daily. 16 g 6  . hydrocortisone 2.5 % cream Apply topically 2 (two) times daily. (Patient not taking: Reported on 04/13/2018) 30 g 0  . mupirocin ointment (BACTROBAN) 2 % Place 1 application into the nose 2 (two) times daily. 22 g 0  . triamcinolone cream (KENALOG) 0.1 % Apply 1 application topically 2 (two) times daily. 30 g 1  . Vitamin D, Ergocalciferol, (DRISDOL) 50000 units CAPS capsule Take 1 capsule (50,000 Units total) by mouth every 7 (seven) days. 12 capsule 1   No facility-administered medications prior to visit.     Allergies  Allergen Reactions  . Thimerosal Swelling    In eyes, due to thimerosal in contact solution   . Sulfa Antibiotics Other (See Comments)    Doesn't remember        Objective:    BP (!) 132/92 (BP Location: Left Arm, Patient Position: Sitting, Cuff Size: Normal)   Pulse 96   Temp 98.9 F (37.2 C) (Oral)   Ht 5' 9"  (1.753 m)   Wt 154 lb (69.9 kg)   SpO2 94%   BMI 22.74 kg/m  Wt Readings from Last 3 Encounters:  12/11/18 154 lb (69.9 kg)  04/13/18 146 lb (66.2 kg)  02/25/18 144 lb (65.3 kg)    Physical Exam Vitals signs and nursing note reviewed.  Constitutional:      Appearance: She is well-developed.  HENT:     Head: Normocephalic and atraumatic.     Right Ear: External ear normal.     Left Ear: External ear normal.     Nose: Nose normal.     Mouth/Throat:     Pharynx: No oropharyngeal exudate.  Eyes:     General: No scleral icterus.       Right eye: No discharge.     Conjunctiva/sclera: Conjunctivae normal.     Pupils: Pupils are equal, round, and reactive to light.  Neck:      Musculoskeletal: Normal range of motion and neck supple.     Thyroid: No thyromegaly.     Trachea: No tracheal deviation.  Cardiovascular:     Rate and Rhythm: Normal rate and regular rhythm.     Heart sounds: Normal heart sounds. No murmur. No friction rub. No gallop.   Pulmonary:     Effort: Pulmonary effort is normal. No tachypnea, accessory muscle usage or respiratory distress.     Breath sounds: Normal breath sounds. No decreased breath sounds, wheezing, rhonchi or rales.  Chest:     Chest wall: No tenderness.     Breasts: Breasts are symmetrical.        Right: No inverted nipple, mass,  nipple discharge, skin change or tenderness.        Left: No inverted nipple, mass, nipple discharge, skin change or tenderness.     Comments: Bilateral breast implants Abdominal:     General: Bowel sounds are normal. There is no distension.     Palpations: Abdomen is soft. There is no mass.     Tenderness: There is no abdominal tenderness. There is no guarding or rebound.  Musculoskeletal: Normal range of motion.        General: No tenderness or deformity.     Cervical back: She exhibits normal range of motion, no tenderness, no bony tenderness, no swelling and no edema.  Lymphadenopathy:     Cervical: No cervical adenopathy.  Skin:    General: Skin is warm and dry.     Findings: No erythema.  Neurological:     Mental Status: She is alert and oriented to person, place, and time.     Cranial Nerves: No cranial nerve deficit.     Coordination: Coordination normal.     Deep Tendon Reflexes: Reflexes are normal and symmetric.  Psychiatric:        Attention and Perception: Attention normal.        Mood and Affect: Mood is anxious.        Speech: Speech normal.        Behavior: Behavior normal. Behavior is cooperative.        Thought Content: Thought content does not include homicidal or suicidal ideation. Thought content does not include homicidal or suicidal plan.        Cognition and Memory:  Cognition normal.        Judgment: Judgment normal.        Patient has been counseled extensively about nutrition and exercise as well as the importance of adherence with medications and regular follow-up. The patient was given clear instructions to go to ER or return to medical center if symptoms don't improve, worsen or new problems develop. The patient verbalized understanding.   Follow-up: Return if symptoms worsen or fail to improve.   Gildardo Pounds, FNP-BC [facility] [place], [place]   12/13/2018, 5:40 PM

## 2018-12-12 LAB — CMP14+EGFR
A/G RATIO: 2.1 (ref 1.2–2.2)
ALT: 35 IU/L — AB (ref 0–32)
AST: 24 IU/L (ref 0–40)
Albumin: 4.9 g/dL — ABNORMAL HIGH (ref 3.6–4.8)
Alkaline Phosphatase: 75 IU/L (ref 39–117)
BUN / CREAT RATIO: 16 (ref 12–28)
BUN: 10 mg/dL (ref 8–27)
CHLORIDE: 101 mmol/L (ref 96–106)
CO2: 21 mmol/L (ref 20–29)
Calcium: 9.6 mg/dL (ref 8.7–10.3)
Creatinine, Ser: 0.62 mg/dL (ref 0.57–1.00)
GFR calc non Af Amer: 97 mL/min/{1.73_m2} (ref >59)
GFR, EST AFRICAN AMERICAN: 112 mL/min/{1.73_m2} (ref >59)
Globulin, Total: 2.3 g/dL (ref 1.5–4.5)
Glucose: 82 mg/dL (ref 65–99)
POTASSIUM: 4.2 mmol/L (ref 3.5–5.2)
Sodium: 142 mmol/L (ref 134–144)
TOTAL PROTEIN: 7.2 g/dL (ref 6.0–8.5)

## 2018-12-12 LAB — CBC
HEMOGLOBIN: 13.8 g/dL (ref 11.1–15.9)
Hematocrit: 40.6 % (ref 34.0–46.6)
MCH: 31.2 pg (ref 26.6–33.0)
MCHC: 34 g/dL (ref 31.5–35.7)
MCV: 92 fL (ref 79–97)
PLATELETS: 348 10*3/uL (ref 150–450)
RBC: 4.42 x10E6/uL (ref 3.77–5.28)
RDW: 12.5 % (ref 12.3–15.4)
WBC: 8.5 10*3/uL (ref 3.4–10.8)

## 2018-12-12 LAB — LIPID PANEL
CHOLESTEROL TOTAL: 247 mg/dL — AB (ref 100–199)
Chol/HDL Ratio: 3.9 ratio (ref 0.0–4.4)
HDL: 64 mg/dL (ref >39)
LDL Calculated: 153 mg/dL — ABNORMAL HIGH (ref 0–99)
TRIGLYCERIDES: 149 mg/dL (ref 0–149)
VLDL Cholesterol Cal: 30 mg/dL (ref 5–40)

## 2018-12-12 LAB — VITAMIN D 25 HYDROXY (VIT D DEFICIENCY, FRACTURES): Vit D, 25-Hydroxy: 16.1 ng/mL — ABNORMAL LOW (ref 30.0–100.0)

## 2018-12-13 ENCOUNTER — Encounter: Payer: Self-pay | Admitting: Nurse Practitioner

## 2018-12-13 MED ORDER — VITAMIN D (ERGOCALCIFEROL) 1.25 MG (50000 UNIT) PO CAPS: 50000.0000 [IU] | ORAL_CAPSULE | ORAL | 1 refills | Status: AC

## 2018-12-17 ENCOUNTER — Telehealth: Payer: Self-pay | Admitting: Nurse Practitioner

## 2018-12-17 NOTE — Telephone Encounter (Signed)
1) Medication(s) Requested (by name): Gabapentin *patient says the medication is not working for her neck pain. Patient says she has increased her dosage on her own to try to help with the pain and started taking 4 a day for about two days and says it does not help. 2) [facility] [address]  Patient was advised to go to ED or UC  If she can not wait until someone reaches out to her

## 2018-12-17 NOTE — Telephone Encounter (Signed)
Cynthia Best, please advise on how the patient should proceed.

## 2018-12-17 NOTE — Telephone Encounter (Signed)
1) Medication(s) Requested (by name): °Gabapentin °*patient says the medication is not working for her neck pain. Patient says she has increased her dosage on her own to try to help with the pain and started taking 4 a day for about two days and says it does not help. °2) Pharmacy of Choice: °[facility] on [address] ° °Patient was advised to go to ED or UC  If she can not wait until someone reaches out to her °

## 2018-12-17 NOTE — Telephone Encounter (Signed)
Patient verified DOB Patient complains of neck pain which was addressed at her 12/01/18 visit. Patient was prescribed gabapentin and advised to alternate ice/heat as well as ibuprofen and tylenol along with the gabapentin. Patient states she has had no relief with the gabapentin alone and does not recall being advised to alternate the heat/cold, ibuprofen/tylenol. Patient was advised to add this to the gabapentin regimen and see if the pain is relieved. Patient refused the advise and stated she needed to be prescribed something different now. MA advised patient of PCP being out of the office and patient was just recently prescribed the medication 6 days prior. Patient was advised to report to an UC or the ED if pain was too severe. Patient denied any other symptoms besides not being able to sleep. Patient began to [facility] and [facility], stating she was not reporting to the ED/UC and the office needed to MAKE and appointment for her. Patient was advised of the office closing in 15 minutes and that request was not possible and again the UC/ED could address her concern today. Patient continue to yell through the phone stating she was not reporting the the ED and would just "continue to suffer" MA assured patient of wanting to help and give her the best options based on her complaint and patient continued to yell and scream. MA politely asked patient to maintain a low tone so that we could come to a resolution and patient continued to yell. MA advised patient one last time to please report to the ED/UC if the pain was unbearable (even offered to [facility] EMS with patient on the phone, which was denied) and had to politely disconnect the phone call. Will route to covering provider and triage nurse.

## 2018-12-18 ENCOUNTER — Other Ambulatory Visit: Payer: Self-pay | Admitting: Nurse Practitioner

## 2018-12-18 ENCOUNTER — Telehealth: Payer: Self-pay | Admitting: *Deleted

## 2018-12-18 DIAGNOSIS — M5412 Radiculopathy, cervical region: Secondary | ICD-10-CM

## 2018-12-18 MED ORDER — DICLOFENAC SODIUM 75 MG PO TBEC: 75.0000 mg | DELAYED_RELEASE_TABLET | Freq: Every day | ORAL | 1 refills | Status: AC

## 2018-12-18 MED ORDER — DICLOFENAC SODIUM 75 MG PO TBEC: 75.0000 mg | DELAYED_RELEASE_TABLET | Freq: Every day | ORAL | 1 refills | Status: DC

## 2018-12-18 NOTE — Telephone Encounter (Signed)
I have sent diclofenac to the pharmacy. If she needs anything stronger she will need an xray performed first.

## 2018-12-18 NOTE — Telephone Encounter (Signed)
Patient verified DOB Patient is aware of Voltaren tablet being sent to the pharmacy, patient requested it be sent to a different pharmacy. Patient advised to use medication alternating tylenol and ibuprofen along with heat and ice for relief. Patient aware of imaging being performed prior to anything stronger being prescribed.

## 2018-12-18 NOTE — Telephone Encounter (Signed)
Patient aware of script sent to alternate pharmacy

## 2018-12-30 ENCOUNTER — Other Ambulatory Visit: Payer: Self-pay | Admitting: Nurse Practitioner

## 2018-12-30 DIAGNOSIS — G2581 Restless legs syndrome: Secondary | ICD-10-CM

## 2019-02-24 ENCOUNTER — Other Ambulatory Visit: Payer: Self-pay | Admitting: Nurse Practitioner

## 2019-02-24 DIAGNOSIS — G2581 Restless legs syndrome: Secondary | ICD-10-CM

## 2019-02-25 ENCOUNTER — Telehealth: Payer: Self-pay | Admitting: Nurse Practitioner

## 2019-02-25 NOTE — Telephone Encounter (Signed)
1) Medication(s) Requested (by name): rOPINIRole    *Patient states she does not believe she has to be seen because she just had a physical done in December. She would like for her medication to be called in please follow up 2) Pharmacy of Choice:  [facility] [address], [place] - [address] [place]

## 2019-02-25 NOTE — Telephone Encounter (Signed)
Will route to PCP 

## 2019-02-26 ENCOUNTER — Encounter: Payer: Self-pay | Admitting: Family Medicine

## 2019-02-26 ENCOUNTER — Ambulatory Visit: Payer: BLUE CROSS/BLUE SHIELD | Attending: Family Medicine | Admitting: Family Medicine

## 2019-02-26 VITALS — BP 136/79 | HR 87 | Temp 98.6°F | Resp 18 | Ht 69.0 in | Wt 156.0 lb

## 2019-02-26 DIAGNOSIS — F17210 Nicotine dependence, cigarettes, uncomplicated: Secondary | ICD-10-CM | POA: Diagnosis not present

## 2019-02-26 DIAGNOSIS — M5412 Radiculopathy, cervical region: Secondary | ICD-10-CM

## 2019-02-26 DIAGNOSIS — K760 Fatty (change of) liver, not elsewhere classified: Secondary | ICD-10-CM

## 2019-02-26 DIAGNOSIS — J432 Centrilobular emphysema: Secondary | ICD-10-CM

## 2019-02-26 DIAGNOSIS — F17200 Nicotine dependence, unspecified, uncomplicated: Secondary | ICD-10-CM

## 2019-02-26 DIAGNOSIS — G2581 Restless legs syndrome: Secondary | ICD-10-CM

## 2019-02-26 DIAGNOSIS — Z760 Encounter for issue of repeat prescription: Secondary | ICD-10-CM

## 2019-02-26 MED ORDER — ROPINIROLE HCL 3 MG PO TABS: 3.0000 mg | ORAL_TABLET | Freq: Every day | ORAL | 2 refills | Status: DC

## 2019-02-26 MED ORDER — DICLOFENAC SODIUM 50 MG PO TBEC: 50.0000 mg | DELAYED_RELEASE_TABLET | Freq: Two times a day (BID) | ORAL | 0 refills | Status: DC

## 2019-02-26 MED ORDER — GABAPENTIN 100 MG PO CAPS: 100.0000 mg | ORAL_CAPSULE | Freq: Three times a day (TID) | ORAL | 2 refills | Status: DC

## 2019-02-26 NOTE — Patient Instructions (Addendum)
Please establish with a primary care physician in the [facility]. You may wish to obtain a referral to a gastroenterologist regarding your findings of a fatty liver on CT scan done in April 2016. You also had evidence of centrilobular emphysema on that same CT scan so you should discuss smoking cessation with your new primary care as well as a possible referral to a lung specialist.  Cervical Radiculopathy  Cervical radiculopathy means that a nerve in the neck is pinched or bruised. This can cause pain or loss of feeling (numbness) that runs from your neck to your arm and fingers. Follow these instructions at home: Managing pain  Take over-the-counter and prescription medicines only as told by your doctor.  If directed, put ice on the injured or painful area. ? Put ice in a plastic bag. ? Place a towel between your skin and the bag. ? Leave the ice on for 20 minutes, 2-3 times per day.  If ice does not help, you can try using heat. Take a warm shower or warm bath, or use a heat pack as told by your doctor.  You may try a gentle neck and shoulder massage. Activity  Rest as needed. Follow instructions from your doctor about any activities to avoid.  Do exercises as told by your doctor or physical therapist. General instructions  If you were given a soft collar, wear it as told by your doctor.  Use a flat pillow when you sleep.  Keep all follow-up visits as told by your doctor. This is important. Contact a doctor if:  Your condition does not improve with treatment. Get help right away if:  Your pain gets worse and is not controlled with medicine.  You lose feeling or feel weak in your hand, arm, face, or leg.  You have a fever.  You have a stiff neck.  You cannot control when you poop or pee (have incontinence).  You have trouble with walking, balance, or talking. This information is not intended to replace advice given to you by your health care provider.  Make sure you discuss any questions you have with your health care provider. Document Released: 11/28/2011 Document Revised: 05/16/2016 Document Reviewed: 02/02/2015 Elsevier Interactive Patient Education  2019 [employer].

## 2019-02-26 NOTE — Progress Notes (Signed)
Patient complains of restless legs and taking Requip for the past 12 years.

## 2019-02-26 NOTE — Progress Notes (Signed)
Subjective:    Patient ID: Cynthia Best, female    DOB: May 27, 1956, 63 y.o.   MRN: [id number]  HPI       63 year old female who states that she needs a refill of Requip for restless leg syndrome.  Patient states that she had a physical with her primary care provider in December of last year and normally her primary care provider gives her refills of her medications when she is at her annual well visit.  Patient however states that she recently called to obtain a refill for her Requip and patient was told that she would need to schedule an office visit.  Patient reports dissatisfaction with having to come in for medication refill.  Patient reports that without the Requip, her legs constantly move throughout the night or she will have the uncomfortable sensation that she needs to move her legs.  Patient states that use of Requip has improved her quality of sleep.  Patient states that she works in [place] and usually stays in [place] during the week but also still has a home here in the [place] area where her family lives.      Patient additionally states that she would like to have refills of gabapentin as well as the medication which starts with a D that she was prescribed when she saw her primary care provider due to burning sensation radiating from her left neck to her left arm/shoulder area and upper arm.  Patient states that she was never told her diagnosis but that the gabapentin and the other medication helped.      Patient also with complaint of abdominal distention.  Patient states that she continues to gain weight in her abdominal area without changes in her diet.  Patient states that she also has occasional pain in her right upper abdomen.  Pain generally does not last more than an hour and is a 3-4 on a 0-to-10 scale.  Patient describes this as a dull, aching sensation.  Patient denies any diarrhea, no change in stool color, no nausea and no diarrhea.  Patient denies any past history of  gallstones.  Patient states that she received notification that she did have an abnormality in liver enzymes with blood work done at her appointment in December but patient states that she did not receive any additional information regarding her lab work.  When questioned regarding alcohol use, patient states that she has a few drinks a few times per week.      Patient does continue to smoke about 1 pack/day of cigarettes and has occasional nonproductive cough which usually occurs earlier in the day/morning.  Patient does not feel as if she is having any shortness of breath, wheezing or chest tightness.  Past Medical History:  Diagnosis Date  . Arthritis   . Depression   . Herpes   . Scoliosis   . Spondylolysis of cervical region    Past Surgical History:  Procedure Laterality Date  . ABDOMINAL HYSTERECTOMY  01/14/2006  . APPENDECTOMY  01/14/2005  . Myoectomy    . PLACEMENT OF BREAST IMPLANTS    . VAGINAL PROLAPSE REPAIR  04/14/2006   Family History  Problem Relation Age of Onset  . Stroke Mother   . Heart disease Father   . Hyperlipidemia Brother   . Diabetes Maternal Grandmother    Social History   Tobacco Use  . Smoking status: Current Every Day Smoker    Packs/day: 0.34    Years: 17.00    Pack  years: 5.78    Types: Cigarettes  . Smokeless tobacco: Former Neurosurgeon    Quit date: 03/07/2015  Substance Use Topics  . Alcohol use: Yes    Alcohol/week: 0.0 standard drinks    Comment: 2 glass wine daily  . Drug use: No   Allergies  Allergen Reactions  . Thimerosal Swelling    In eyes, due to thimerosal in contact solution   . Sulfa Antibiotics Other (See Comments)    Doesn't remember       Review of Systems  Constitutional: Positive for fatigue. Negative for activity change, appetite change, chills, diaphoresis, fever and unexpected weight change (weight has not changed but abdomen is more prominent).  HENT: Negative for congestion, sore throat and trouble swallowing.     Respiratory: Positive for cough. Negative for chest tightness, shortness of breath and wheezing.   Gastrointestinal: Positive for abdominal distention and abdominal pain. Negative for blood in stool, constipation, diarrhea and nausea.  Genitourinary: Negative for dysuria and frequency.  Musculoskeletal: Positive for arthralgias, back pain, neck pain and neck stiffness.  Neurological: Negative for dizziness and headaches.  Hematological: Negative for adenopathy. Does not bruise/bleed easily.       Objective:   Physical Exam BP 136/79 (BP Location: Left Arm, Patient Position: Sitting, Cuff Size: Small)   Pulse 87   Temp 98.6 F (37 C) (Oral)   Resp 18   Ht  (1.753 m)   Wt 156 lb (70.8 kg)   SpO2 96%   BMI 23.04 kg/m Nurse's notes and vital signs reviewed General-well-nourished, well-developed older female in no acute distress but patient is slightly agitated at times ENT- TMs dull, nares with mild edema of the nasal turbinates, mild posterior pharynx erythema Neck-supple, patient with some mild posterior neck/cervical paraspinous spasm but no tenderness, no lymphadenopathy, no thyromegaly Lungs- patient with scattered coarse breath sounds which clear after cough.  Breathing is nonlabored Cardiovascular-regular rate and rhythm Abdomen- patient with abdominal distention, normal bowel sounds, abdomen is soft and nontender Back-no CVA tenderness.  Patient with some bilateral upper back/trapezius area spasm left greater than right.  Patient with mild discomfort to palpation over the left upper back/trapezius area Musculoskeletal-patient with some mild discomfort with empty can maneuver at the left upper arm/shoulder Extremities-no edema Psych- patient was slightly agitated at times expressing that there were medical issues/findings that her usual PCP had not discussed with her      Assessment & Plan:  1. Restless legs syndrome Patient provided with refill of Requip which she states  that she has been taking several years to help with restless leg syndrome.  Patient also has long-term smoking history and at some point may want to have vascular studies as well to make sure that there is no peripheral arterial disease contributing to her symptoms of leg discomfort. - rOPINIRole (REQUIP) 3 MG tablet; Take 1 tablet (3 mg total) by mouth at bedtime for 30 days.  Dispense: 30 tablet; Refill:   2. Cervical radicular pain Discussed with the patient that her office note from her 12/11/2018 well female exam visit also notes that patient with cervical radicular pain for which patient was prescribed gabapentin/Neurontin.  Patient states that she was never given a name for her neck and left shoulder pain.  Patient however would like to have a refill the Neurontin as she states that this has helped.  Prescription provided for refill of Neurontin and patient is encouraged to establish with a PCP for regular follow-up.  Discussed with  the patient that her insurance is no longer in network with this office and patient has multiple chronic medical conditions for which she should follow-up more than once per year.  Also discussed with the patient that due to changes in [employer], annual physical exams are designed for preventative care only and that patients are now expected to either make separate visit for non-preventative care issues or will otherwise have to be billed for preventative care visit as well as separate visit charge for any non-preventative issues that their provider agrees to address at their preventative/well exam.  Patient also likely has some element of rotator cuff tendinopathy/tendinitis at the left shoulder and patient is encouraged to establish with a PCP within her insurance network for further evaluation and treatment.  Patient was provided with refills of gabapentin 100 mg 3 times daily and on review of chart, patient was previously prescribed diclofenac and  prescription given for twice daily use and patient should eat prior to taking the medication to help avoid stomach upset.  On review of chart, patient with x-rays of the cervical spine done on April 2013 showing moderate spondylosis in the lower cervical spine with significant loss of disc height and endplate changes at C5-6 and C6-7 along with degenerative anteriolisthesis at C4-5. - gabapentin (NEURONTIN) 100 MG capsule; Take 1 capsule (100 mg total) by mouth 3 (three) times daily.  Dispense: 90 capsule; Refill: 2 - diclofenac (VOLTAREN) 50 MG EC tablet; Take 1 tablet (50 mg total) by mouth 2 (two) times daily. After a meal as needed for pain  Dispense: 60 tablet; Refill: 0  3.  Hepatic steatosis Patient with complaint of right upper quadrant pain as well as abdominal distention.  On review of patient's past medical records, patient with chest CT scan done in April 2016 which also mentions presence of hepatic steatosis.  Patient is encouraged to schedule with an in network PCP for further follow-up of hepatic steatosis and recent mild increase in LFTs on labs done as part of her annual well exam.  Patient admits to occasional alcohol use and patient was advised to discontinue use of alcohol, Tylenol and follow a low-fat diet.  Patient also encouraged to obtain gastroenterology follow-up.  4.  Centrilobular emphysema Also discussed with the patient that on the same CT from April 2016, centrilobular emphysema was also noted.  Patient reports that she does continue to smoke and I discussed with the patient the importance of smoking cessation.  Patient was also advised to establish with an in network PCP for further follow-up of centrilobular emphysema and for help with smoking cessation.  Minimum of 25 minutes was spent in face-to-face time with the patient at today's visit including gathering history, examination and reviewing/discussing treatment plan/medications  An After Visit Summary was printed and  given to the patient.  Allergies as of 02/26/2019      Reactions   Thimerosal Swelling   In eyes, due to thimerosal in contact solution    Sulfa Antibiotics Other (See Comments)   Doesn't remember       Medication List       Accurate as of February 26, 2019 11:59 PM. Always use your most recent med list.        aerochamber plus with mask inhaler Use as instructed   albuterol 108 (90 Base) MCG/ACT inhaler Commonly known as:  PROVENTIL HFA;VENTOLIN HFA Inhale 2 puffs into the lungs every 4 (four) hours as needed for wheezing or shortness of breath.  amphetamine-dextroamphetamine 20 MG tablet Commonly known as:  ADDERALL Take 20 mg by mouth 2 (two) times daily.   diclofenac 50 MG EC tablet Commonly known as:  VOLTAREN Take 1 tablet (50 mg total) by mouth 2 (two) times daily. After a meal as needed for pain   FLUoxetine 20 MG capsule Commonly known as:  PROZAC Take 60 mg by mouth daily.   gabapentin 100 MG capsule Commonly known as:  NEURONTIN Take 1 capsule (100 mg total) by mouth 3 (three) times daily.   QUEtiapine 200 MG 24 hr tablet Commonly known as:  SEROQUEL XR Take 100 mg by mouth at bedtime.   QUEtiapine 100 MG tablet Commonly known as:  SEROQUEL Take 150 mg by mouth at bedtime. Take 1 and 1/2   rOPINIRole 3 MG tablet Commonly known as:  REQUIP Take 1 tablet (3 mg total) by mouth at bedtime for 30 days.   valACYclovir 500 MG tablet Commonly known as:  Valtrex Take 1 tablet (500 mg total) by mouth daily.   Vitamin D (Ergocalciferol) 1.25 MG (50000 UT) Caps capsule Commonly known as:  DRISDOL Take 1 capsule (50,000 Units total) by mouth every 7 (seven) days.      Return for please establish care with a PCP in the [facility].

## 2019-02-28 NOTE — Telephone Encounter (Signed)
Medication was refilled on 02-26-2019. She also needs to work on establishing care with NEW PCP due to new insurance and we are out of network. Thank you.

## 2019-03-01 NOTE — Telephone Encounter (Signed)
CMA spoke to patient to inform on refill and PCP advising. Pt. Understood.

## 2021-07-22 ENCOUNTER — Other Ambulatory Visit: Payer: Self-pay

## 2021-07-22 ENCOUNTER — Emergency Department (HOSPITAL_BASED_OUTPATIENT_CLINIC_OR_DEPARTMENT_OTHER)
Admission: EM | Admit: 2021-07-22 | Discharge: 2021-07-22 | Disposition: A | Discharge status: Home / Self Care | Payer: BLUE CROSS/BLUE SHIELD | Attending: Emergency Medicine | Admitting: Emergency Medicine

## 2021-07-22 ENCOUNTER — Encounter (HOSPITAL_BASED_OUTPATIENT_CLINIC_OR_DEPARTMENT_OTHER): Payer: Self-pay | Admitting: Obstetrics and Gynecology

## 2021-07-22 ENCOUNTER — Emergency Department (HOSPITAL_BASED_OUTPATIENT_CLINIC_OR_DEPARTMENT_OTHER): Payer: BLUE CROSS/BLUE SHIELD

## 2021-07-22 DIAGNOSIS — M79605 Pain in left leg: Secondary | ICD-10-CM | POA: Diagnosis not present

## 2021-07-22 DIAGNOSIS — F17210 Nicotine dependence, cigarettes, uncomplicated: Secondary | ICD-10-CM | POA: Diagnosis not present

## 2021-07-22 NOTE — ED Triage Notes (Signed)
Patient reports to the ER for left leg swelling, cramping, and pain in the calf region. Patient reports she came to rule out DVT

## 2021-07-22 NOTE — Discharge Instructions (Addendum)
Recommend following up with your primary care doctor.  Take Tylenol or Motrin as needed for pain.  If you have significant increase in pain, swelling, redness or other new concerning symptom, come back to ER for reassessment.

## 2021-07-22 NOTE — ED Provider Notes (Signed)
[facility] EMERGENCY DEPT Provider Note   CSN: [id number] Arrival date & time: 07/22/21  1646     History No chief complaint on file.   Cynthia Best is a 65 y.o. female.  Presents to ER with concern for left leg pain.  Patient states symptoms ongoing for the past few days, has noted slight cramping in her left calf area and states that she noted some swelling this evening.  Pain is minimal at present.  Denies any recent injuries.  Ambulating leg without difficulty.  No skin color changes.  No chills or fevers.  HPI     Past Medical History:  Diagnosis Date   Arthritis    Depression    Herpes    Scoliosis    Spondylolysis of cervical region     Patient Active Problem List   Diagnosis Date Noted   Actinic keratoses 01/24/2017   Vitamin D deficiency 12/19/2015   Family history of heart disease in female family member before age 28 12/15/2015   Genital herpes 12/15/2015   Nail problem 12/15/2015   Obstructive chronic bronchitis without exacerbation (HCC) 06/14/2015   Lung nodules 04/11/2015   Systolic ejection murmur 04/11/2015   S/P hysterectomy 04/04/2015   Restless leg syndrome 03/28/2015   Tobacco abuse in remission 03/07/2015   Depression 03/07/2015   Insomnia 03/07/2015    Past Surgical History:  Procedure Laterality Date   ABDOMINAL HYSTERECTOMY  01/14/2006   APPENDECTOMY  01/14/2005   Myoectomy     PLACEMENT OF BREAST IMPLANTS     VAGINAL PROLAPSE REPAIR  04/14/2006     OB History   No obstetric history on file.     Family History  Problem Relation Age of Onset   Stroke Mother    Heart disease Father    Hyperlipidemia Brother    Diabetes Maternal Grandmother     Social History   Tobacco Use   Smoking status: Every Day    Packs/day: 0.34    Years: 17.00    Pack years: 5.78    Types: Cigarettes   Smokeless tobacco: Former    Quit date: 03/07/2015  Vaping Use   Vaping Use: Never used  Substance Use Topics   Alcohol use: Yes     Alcohol/week: 0.0 standard drinks    Comment: 2 glass wine daily   Drug use: No    Home Medications Prior to Admission medications   Medication Sig Start Date End Date Taking? Authorizing Provider  albuterol (PROVENTIL HFA;VENTOLIN HFA) 108 (90 Base) MCG/ACT inhaler Inhale 2 puffs into the lungs every 4 (four) hours as needed for wheezing or shortness of breath. 04/14/18   Claiborne Rigg, NP  amphetamine-dextroamphetamine (ADDERALL) 20 MG tablet Take 20 mg by mouth 2 (two) times daily.    [provider]  diclofenac (VOLTAREN) 50 MG EC tablet Take 1 tablet (50 mg total) by mouth 2 (two) times daily. After a meal as needed for pain 02/26/19   Fulp, Cammie, MD  FLUoxetine (PROZAC) 20 MG capsule Take 60 mg by mouth daily.     [provider]  gabapentin (NEURONTIN) 100 MG capsule Take 1 capsule (100 mg total) by mouth 3 (three) times daily. 02/26/19   Fulp, Cammie, MD  QUEtiapine (SEROQUEL XR) 200 MG 24 hr tablet Take 100 mg by mouth at bedtime.    [provider]  QUEtiapine (SEROQUEL) 100 MG tablet Take 150 mg by mouth at bedtime. Take 1 and 1/2     [provider]  rOPINIRole (REQUIP) 3 MG tablet Take 1 tablet (3 mg total) by mouth at bedtime for 30 days. 02/26/19 03/28/19  Cain Saupe, MD  Spacer/Aero-Holding Chambers (AEROCHAMBER PLUS WITH MASK) inhaler Use as instructed Patient not taking: Reported on 04/13/2018 06/21/17   Muthersbaugh, Dahlia Client, PA-C  Vitamin D, Ergocalciferol, (DRISDOL) 1.25 MG (50000 UT) CAPS capsule Take 1 capsule (50,000 Units total) by mouth every 7 (seven) days. 12/13/18   Claiborne Rigg, NP    Allergies    Thimerosal and Sulfa antibiotics  Review of Systems   Review of Systems  Constitutional:  Negative for chills and fever.  HENT:  Negative for ear pain and sore throat.   Eyes:  Negative for pain and visual disturbance.  Respiratory:  Negative for cough and shortness of breath.   Cardiovascular:  Positive for leg swelling.  Negative for chest pain and palpitations.  Gastrointestinal:  Negative for abdominal pain and vomiting.  Genitourinary:  Negative for dysuria and hematuria.  Musculoskeletal:  Positive for arthralgias. Negative for back pain.  Skin:  Negative for color change and rash.  Neurological:  Negative for seizures and syncope.  All other systems reviewed and are negative.  Physical Exam Updated Vital Signs BP (!) 170/88 (BP Location: Right Arm)   Pulse 67   Temp 99 F (37.2 C) (Oral)   Resp 18   SpO2 99%   Physical Exam Vitals and nursing note reviewed.  Constitutional:      General: She is not in acute distress.    Appearance: She is well-developed.  HENT:     Head: Normocephalic and atraumatic.  Eyes:     Conjunctiva/sclera: Conjunctivae normal.  Cardiovascular:     Rate and Rhythm: Normal rate.     Pulses: Normal pulses.  Pulmonary:     Effort: Pulmonary effort is normal. No respiratory distress.  Musculoskeletal:     Cervical back: Neck supple.     Comments: Left lower extremity: Her left leg appears grossly normal, no tenderness to palpation throughout careful inspection, normal DP/PT pulse, normal sensation, normal joint ROM  Skin:    General: Skin is warm and dry.  Neurological:     General: No focal deficit present.     Mental Status: She is alert.  Psychiatric:        Mood and Affect: Mood normal.    ED Results / Procedures / Treatments   Labs (all labs ordered are listed, but only abnormal results are displayed) Labs Reviewed - No data to display  EKG None  Radiology US Venous Img Lower Unilateral Left  Result Date: 07/22/2021 CLINICAL DATA:  Left calf pain and swelling for multiple months. EXAM: LEFT LOWER EXTREMITY VENOUS DOPPLER ULTRASOUND TECHNIQUE: Gray-scale sonography with compression, as well as color and duplex ultrasound, were performed to evaluate the deep venous system(s) from the level of the common femoral vein through the popliteal and proximal  calf veins. COMPARISON:  None. FINDINGS: VENOUS Normal compressibility of the common femoral, superficial femoral, and popliteal veins, as well as the visualized calf veins. Visualized portions of profunda femoral vein and great saphenous vein unremarkable. No filling defects to suggest DVT on grayscale or color Doppler imaging. Doppler waveforms show normal direction of venous flow, normal respiratory plasticity and response to augmentation. Limited views of the contralateral common femoral vein are unremarkable. OTHER None. Limitations: none IMPRESSION: No evidence for acute DVT within the left lower extremity. Electronically Signed   By: Annia Belt M.D.   On: 07/22/2021  18:42    Procedures Procedures   Medications Ordered in ED Medications - No data to display  ED Course  I have reviewed the triage vital signs and the nursing notes.  Pertinent labs & imaging results that were available during my care of the patient were reviewed by me and considered in my medical decision making (see chart for details).    MDM Rules/Calculators/A&P                           65 year old lady presented to ER with concern for left leg pain, cramping.  On exam her leg appears grossly normal, neurovascularly intact.  DVT study negative.  No report of trauma and no signs of trauma on exam.  Ambulating without difficulty.  Given the reassuring work-up and exam, suspect most likely MSK strain versus muscle cramps.  Recommend follow-up with primary doctor, discharged home.  After the discussed management above, the patient was determined to be safe for discharge.  The patient was in agreement with this plan and all questions regarding their care were answered.  ED return precautions were discussed and the patient will return to the ED with any significant worsening of condition.  Final Clinical Impression(s) / ED Diagnoses Final diagnoses:  Pain of left lower extremity    Rx / DC Orders ED Discharge Orders      None        Milagros Loll, MD 07/22/21 [id number]

## 2021-12-13 ENCOUNTER — Other Ambulatory Visit (HOSPITAL_BASED_OUTPATIENT_CLINIC_OR_DEPARTMENT_OTHER): Payer: Self-pay

## 2021-12-13 MED ORDER — AMPHETAMINE-DEXTROAMPHETAMINE 30 MG PO TABS
ORAL_TABLET | ORAL | 0 refills | Status: DC
  Filled 2021-12-13 – 2021-12-14 (×2): qty 60, 30d supply, fill #0

## 2021-12-14 ENCOUNTER — Other Ambulatory Visit (HOSPITAL_BASED_OUTPATIENT_CLINIC_OR_DEPARTMENT_OTHER): Payer: Self-pay

## 2022-01-18 ENCOUNTER — Other Ambulatory Visit (HOSPITAL_BASED_OUTPATIENT_CLINIC_OR_DEPARTMENT_OTHER): Payer: Self-pay

## 2022-01-18 MED ORDER — AMPHETAMINE-DEXTROAMPHETAMINE 30 MG PO TABS
ORAL_TABLET | ORAL | 0 refills | Status: DC
  Filled 2022-01-18: qty 60, 30d supply, fill #0

## 2022-02-14 ENCOUNTER — Other Ambulatory Visit (HOSPITAL_BASED_OUTPATIENT_CLINIC_OR_DEPARTMENT_OTHER): Payer: Self-pay

## 2022-02-14 ENCOUNTER — Other Ambulatory Visit (HOSPITAL_COMMUNITY): Payer: Self-pay

## 2022-02-14 MED ORDER — FLUOXETINE HCL 20 MG PO CAPS
ORAL_CAPSULE | ORAL | 1 refills | Status: DC
  Filled 2022-02-14: qty 360, 90d supply, fill #0

## 2022-02-14 MED ORDER — AMPHETAMINE-DEXTROAMPHETAMINE 30 MG PO TABS
ORAL_TABLET | ORAL | 0 refills | Status: AC
Start: 2022-02-14 — End: ?
  Filled 2022-02-14: qty 55, 28d supply, fill #0

## 2022-02-15 ENCOUNTER — Other Ambulatory Visit (HOSPITAL_BASED_OUTPATIENT_CLINIC_OR_DEPARTMENT_OTHER): Payer: Self-pay

## 2022-03-15 ENCOUNTER — Other Ambulatory Visit (HOSPITAL_BASED_OUTPATIENT_CLINIC_OR_DEPARTMENT_OTHER): Payer: Self-pay

## 2022-06-21 ENCOUNTER — Encounter: Payer: Self-pay | Admitting: Physician Assistant

## 2022-07-01 ENCOUNTER — Ambulatory Visit: Payer: Medicare HMO | Admitting: Physician Assistant

## 2022-07-08 ENCOUNTER — Ambulatory Visit: Payer: Medicare HMO | Admitting: Physician Assistant

## 2022-09-04 ENCOUNTER — Other Ambulatory Visit: Payer: Self-pay | Admitting: Family Medicine

## 2022-09-04 DIAGNOSIS — Z1231 Encounter for screening mammogram for malignant neoplasm of breast: Secondary | ICD-10-CM

## 2022-10-10 ENCOUNTER — Ambulatory Visit: Payer: Medicare HMO

## 2022-11-22 DIAGNOSIS — Z79899 Other long term (current) drug therapy: Secondary | ICD-10-CM | POA: Diagnosis not present

## 2022-12-05 DIAGNOSIS — K219 Gastro-esophageal reflux disease without esophagitis: Secondary | ICD-10-CM | POA: Diagnosis not present

## 2022-12-05 DIAGNOSIS — E782 Mixed hyperlipidemia: Secondary | ICD-10-CM | POA: Diagnosis not present

## 2022-12-05 DIAGNOSIS — G4700 Insomnia, unspecified: Secondary | ICD-10-CM | POA: Diagnosis not present

## 2022-12-05 DIAGNOSIS — R41840 Attention and concentration deficit: Secondary | ICD-10-CM | POA: Diagnosis not present

## 2022-12-05 DIAGNOSIS — M5137 Other intervertebral disc degeneration, lumbosacral region: Secondary | ICD-10-CM | POA: Diagnosis not present

## 2022-12-20 DIAGNOSIS — E782 Mixed hyperlipidemia: Secondary | ICD-10-CM | POA: Diagnosis not present

## 2022-12-20 DIAGNOSIS — E559 Vitamin D deficiency, unspecified: Secondary | ICD-10-CM | POA: Diagnosis not present

## 2022-12-20 DIAGNOSIS — F331 Major depressive disorder, recurrent, moderate: Secondary | ICD-10-CM | POA: Diagnosis not present

## 2023-01-04 DIAGNOSIS — Z79899 Other long term (current) drug therapy: Secondary | ICD-10-CM | POA: Diagnosis not present

## 2023-01-04 DIAGNOSIS — Z6822 Body mass index (BMI) 22.0-22.9, adult: Secondary | ICD-10-CM | POA: Diagnosis not present

## 2023-01-04 DIAGNOSIS — M5137 Other intervertebral disc degeneration, lumbosacral region: Secondary | ICD-10-CM | POA: Diagnosis not present

## 2023-01-07 DIAGNOSIS — Z79899 Other long term (current) drug therapy: Secondary | ICD-10-CM | POA: Diagnosis not present

## 2023-01-21 DIAGNOSIS — E782 Mixed hyperlipidemia: Secondary | ICD-10-CM | POA: Diagnosis not present

## 2023-01-21 DIAGNOSIS — F331 Major depressive disorder, recurrent, moderate: Secondary | ICD-10-CM | POA: Diagnosis not present

## 2023-01-21 DIAGNOSIS — E559 Vitamin D deficiency, unspecified: Secondary | ICD-10-CM | POA: Diagnosis not present

## 2023-01-30 DIAGNOSIS — F331 Major depressive disorder, recurrent, moderate: Secondary | ICD-10-CM | POA: Diagnosis not present

## 2023-01-30 DIAGNOSIS — E782 Mixed hyperlipidemia: Secondary | ICD-10-CM | POA: Diagnosis not present

## 2023-01-30 DIAGNOSIS — E559 Vitamin D deficiency, unspecified: Secondary | ICD-10-CM | POA: Diagnosis not present

## 2023-02-12 DIAGNOSIS — Z79899 Other long term (current) drug therapy: Secondary | ICD-10-CM | POA: Diagnosis not present

## 2023-02-12 DIAGNOSIS — R9431 Abnormal electrocardiogram [ECG] [EKG]: Secondary | ICD-10-CM | POA: Diagnosis not present

## 2023-02-12 DIAGNOSIS — M62838 Other muscle spasm: Secondary | ICD-10-CM | POA: Diagnosis not present

## 2023-02-12 DIAGNOSIS — M539 Dorsopathy, unspecified: Secondary | ICD-10-CM | POA: Diagnosis not present

## 2023-02-12 DIAGNOSIS — R0602 Shortness of breath: Secondary | ICD-10-CM | POA: Diagnosis not present

## 2023-02-12 DIAGNOSIS — R300 Dysuria: Secondary | ICD-10-CM | POA: Diagnosis not present

## 2023-02-12 DIAGNOSIS — Z0130 Encounter for examination of blood pressure without abnormal findings: Secondary | ICD-10-CM | POA: Diagnosis not present

## 2023-02-12 DIAGNOSIS — M549 Dorsalgia, unspecified: Secondary | ICD-10-CM | POA: Diagnosis not present

## 2023-02-12 DIAGNOSIS — Z6822 Body mass index (BMI) 22.0-22.9, adult: Secondary | ICD-10-CM | POA: Diagnosis not present

## 2023-02-16 DIAGNOSIS — M79603 Pain in arm, unspecified: Secondary | ICD-10-CM | POA: Diagnosis not present

## 2023-02-16 DIAGNOSIS — I249 Acute ischemic heart disease, unspecified: Secondary | ICD-10-CM | POA: Diagnosis not present

## 2023-02-16 DIAGNOSIS — R079 Chest pain, unspecified: Secondary | ICD-10-CM | POA: Diagnosis not present

## 2023-02-16 DIAGNOSIS — R42 Dizziness and giddiness: Secondary | ICD-10-CM | POA: Diagnosis not present

## 2023-02-16 DIAGNOSIS — R9431 Abnormal electrocardiogram [ECG] [EKG]: Secondary | ICD-10-CM | POA: Diagnosis not present

## 2023-02-16 DIAGNOSIS — R0789 Other chest pain: Secondary | ICD-10-CM | POA: Diagnosis not present

## 2023-02-17 ENCOUNTER — Emergency Department (HOSPITAL_COMMUNITY): Payer: Medicare HMO

## 2023-02-17 ENCOUNTER — Observation Stay (HOSPITAL_BASED_OUTPATIENT_CLINIC_OR_DEPARTMENT_OTHER): Payer: Medicare HMO

## 2023-02-17 ENCOUNTER — Encounter (HOSPITAL_COMMUNITY): Payer: Self-pay | Admitting: Emergency Medicine

## 2023-02-17 ENCOUNTER — Other Ambulatory Visit: Payer: Self-pay

## 2023-02-17 ENCOUNTER — Encounter (HOSPITAL_COMMUNITY)
Admission: EM | Disposition: A | Discharge status: Home / Self Care | Payer: Self-pay | Source: Home / Self Care | Attending: Student in an Organized Health Care Education/Training Program

## 2023-02-17 ENCOUNTER — Observation Stay (HOSPITAL_COMMUNITY)
Admission: EM | Admit: 2023-02-17 | Discharge: 2023-02-18 | Disposition: A | Discharge status: Home / Self Care | Payer: Medicare HMO | Attending: Student in an Organized Health Care Education/Training Program | Admitting: Student in an Organized Health Care Education/Training Program

## 2023-02-17 DIAGNOSIS — Z720 Tobacco use: Secondary | ICD-10-CM | POA: Diagnosis not present

## 2023-02-17 DIAGNOSIS — F988 Other specified behavioral and emotional disorders with onset usually occurring in childhood and adolescence: Secondary | ICD-10-CM | POA: Diagnosis present

## 2023-02-17 DIAGNOSIS — I249 Acute ischemic heart disease, unspecified: Secondary | ICD-10-CM | POA: Diagnosis not present

## 2023-02-17 DIAGNOSIS — G894 Chronic pain syndrome: Secondary | ICD-10-CM | POA: Diagnosis present

## 2023-02-17 DIAGNOSIS — R0789 Other chest pain: Principal | ICD-10-CM | POA: Insufficient documentation

## 2023-02-17 DIAGNOSIS — R079 Chest pain, unspecified: Secondary | ICD-10-CM | POA: Diagnosis present

## 2023-02-17 DIAGNOSIS — F17210 Nicotine dependence, cigarettes, uncomplicated: Secondary | ICD-10-CM | POA: Diagnosis not present

## 2023-02-17 DIAGNOSIS — I5032 Chronic diastolic (congestive) heart failure: Secondary | ICD-10-CM | POA: Diagnosis present

## 2023-02-17 DIAGNOSIS — Z79899 Other long term (current) drug therapy: Secondary | ICD-10-CM | POA: Diagnosis not present

## 2023-02-17 DIAGNOSIS — I2510 Atherosclerotic heart disease of native coronary artery without angina pectoris: Secondary | ICD-10-CM | POA: Insufficient documentation

## 2023-02-17 HISTORY — DX: Hyperlipidemia, unspecified: E78.5

## 2023-02-17 HISTORY — PX: LEFT HEART CATH AND CORONARY ANGIOGRAPHY: CATH118249

## 2023-02-17 LAB — CBC WITH DIFFERENTIAL/PLATELET
Abs Immature Granulocytes: 0.01 10*3/uL (ref 0.00–0.07)
Basophils Absolute: 0 10*3/uL (ref 0.0–0.1)
Basophils Relative: 0 %
Eosinophils Absolute: 0.1 10*3/uL (ref 0.0–0.5)
Eosinophils Relative: 1 %
HCT: 34.6 % — ABNORMAL LOW (ref 36.0–46.0)
Hemoglobin: 11.9 g/dL — ABNORMAL LOW (ref 12.0–15.0)
Immature Granulocytes: 0 %
Lymphocytes Relative: 44 %
Lymphs Abs: 2.5 10*3/uL (ref 0.7–4.0)
MCH: 32.7 pg (ref 26.0–34.0)
MCHC: 34.4 g/dL (ref 30.0–36.0)
MCV: 95.1 fL (ref 80.0–100.0)
Monocytes Absolute: 0.5 10*3/uL (ref 0.1–1.0)
Monocytes Relative: 9 %
Neutro Abs: 2.5 10*3/uL (ref 1.7–7.7)
Neutrophils Relative %: 46 %
Platelets: 258 10*3/uL (ref 150–400)
RBC: 3.64 MIL/uL — ABNORMAL LOW (ref 3.87–5.11)
RDW: 13 % (ref 11.5–15.5)
WBC: 5.6 10*3/uL (ref 4.0–10.5)
nRBC: 0 % (ref 0.0–0.2)

## 2023-02-17 LAB — CBC
HCT: 35.2 % — ABNORMAL LOW (ref 36.0–46.0)
Hemoglobin: 12.1 g/dL (ref 12.0–15.0)
MCH: 32.4 pg (ref 26.0–34.0)
MCHC: 34.4 g/dL (ref 30.0–36.0)
MCV: 94.1 fL (ref 80.0–100.0)
Platelets: 262 10*3/uL (ref 150–400)
RBC: 3.74 MIL/uL — ABNORMAL LOW (ref 3.87–5.11)
RDW: 12.8 % (ref 11.5–15.5)
WBC: 6.3 10*3/uL (ref 4.0–10.5)
nRBC: 0 % (ref 0.0–0.2)

## 2023-02-17 LAB — TROPONIN I (HIGH SENSITIVITY)
Troponin I (High Sensitivity): 19 ng/L — ABNORMAL HIGH (ref <18)
Troponin I (High Sensitivity): 18 ng/L — ABNORMAL HIGH (ref <18)
Troponin I (High Sensitivity): 16 ng/L (ref <18)

## 2023-02-17 LAB — BASIC METABOLIC PANEL
Anion gap: 10 (ref 5–15)
BUN: 16 mg/dL (ref 8–23)
CO2: 25 mmol/L (ref 22–32)
Calcium: 9.2 mg/dL (ref 8.9–10.3)
Chloride: 104 mmol/L (ref 98–111)
Creatinine, Ser: 0.83 mg/dL (ref 0.44–1.00)
GFR, Estimated: >60 mL/min (ref >60)
Glucose, Bld: 125 mg/dL — ABNORMAL HIGH (ref 70–99)
Potassium: 3.7 mmol/L (ref 3.5–5.1)
Sodium: 139 mmol/L (ref 135–145)

## 2023-02-17 LAB — COMPREHENSIVE METABOLIC PANEL
ALT: 39 U/L (ref 0–44)
AST: 29 U/L (ref 15–41)
Albumin: 3.4 g/dL — ABNORMAL LOW (ref 3.5–5.0)
Alkaline Phosphatase: 57 U/L (ref 38–126)
Anion gap: 9 (ref 5–15)
BUN: 18 mg/dL (ref 8–23)
CO2: 27 mmol/L (ref 22–32)
Calcium: 9.2 mg/dL (ref 8.9–10.3)
Chloride: 104 mmol/L (ref 98–111)
Creatinine, Ser: 0.84 mg/dL (ref 0.44–1.00)
GFR, Estimated: >60 mL/min (ref >60)
Glucose, Bld: 110 mg/dL — ABNORMAL HIGH (ref 70–99)
Potassium: 4.1 mmol/L (ref 3.5–5.1)
Sodium: 140 mmol/L (ref 135–145)
Total Bilirubin: 0.6 mg/dL (ref 0.3–1.2)
Total Protein: 6.1 g/dL — ABNORMAL LOW (ref 6.5–8.1)

## 2023-02-17 LAB — BRAIN NATRIURETIC PEPTIDE: B Natriuretic Peptide: 70.7 pg/mL (ref 0.0–100.0)

## 2023-02-17 LAB — MAGNESIUM: Magnesium: 1.9 mg/dL (ref 1.7–2.4)

## 2023-02-17 LAB — LIPASE, BLOOD: Lipase: 36 U/L (ref 11–51)

## 2023-02-17 SURGERY — LEFT HEART CATH AND CORONARY ANGIOGRAPHY
Anesthesia: LOCAL

## 2023-02-17 MED ORDER — VALACYCLOVIR HCL 500 MG PO TABS
500.0000 mg | ORAL_TABLET | Freq: Every day | ORAL | Status: DC
  Administered 2023-02-17 – 2023-02-18 (×2): 500 mg via ORAL
  Filled 2023-02-17 (×3): qty 1

## 2023-02-17 MED ORDER — ACETAMINOPHEN 325 MG PO TABS
650.0000 mg | ORAL_TABLET | Freq: Four times a day (QID) | ORAL | Status: DC | PRN
  Administered 2023-02-17: 650 mg via ORAL
  Filled 2023-02-17: qty 2

## 2023-02-17 MED ORDER — TRAMADOL HCL 50 MG PO TABS
100.0000 mg | ORAL_TABLET | Freq: Two times a day (BID) | ORAL | Status: DC | PRN
  Administered 2023-02-17: 100 mg via ORAL
  Filled 2023-02-17: qty 2

## 2023-02-17 MED ORDER — SODIUM CHLORIDE 0.9 % IV SOLN: INTRAVENOUS | Status: AC

## 2023-02-17 MED ORDER — TRAZODONE HCL 50 MG PO TABS
150.0000 mg | ORAL_TABLET | Freq: Every evening | ORAL | Status: DC | PRN
  Filled 2023-02-17: qty 3

## 2023-02-17 MED ORDER — IOHEXOL 350 MG/ML SOLN
INTRAVENOUS | Status: DC | PRN
  Administered 2023-02-17: 70 mL

## 2023-02-17 MED ORDER — MELATONIN 3 MG PO TABS: 3.0000 mg | ORAL_TABLET | Freq: Every evening | ORAL | Status: DC | PRN

## 2023-02-17 MED ORDER — LABETALOL HCL 5 MG/ML IV SOLN: 10.0000 mg | INTRAVENOUS | Status: AC | PRN

## 2023-02-17 MED ORDER — ACETAMINOPHEN 325 MG PO TABS: 650.0000 mg | ORAL_TABLET | ORAL | Status: DC | PRN

## 2023-02-17 MED ORDER — HEPARIN SODIUM (PORCINE) 1000 UNIT/ML IJ SOLN
INTRAMUSCULAR | Status: DC | PRN
  Administered 2023-02-17: 3500 [IU] via INTRAVENOUS

## 2023-02-17 MED ORDER — FLUOXETINE HCL 20 MG PO CAPS
80.0000 mg | ORAL_CAPSULE | Freq: Every day | ORAL | Status: DC
  Administered 2023-02-17 – 2023-02-18 (×2): 80 mg via ORAL
  Filled 2023-02-17 (×2): qty 4

## 2023-02-17 MED ORDER — NICOTINE 14 MG/24HR TD PT24
14.0000 mg | MEDICATED_PATCH | Freq: Every day | TRANSDERMAL | Status: DC | PRN
  Administered 2023-02-17: 14 mg via TRANSDERMAL
  Filled 2023-02-17: qty 1

## 2023-02-17 MED ORDER — SODIUM CHLORIDE 0.9 % IV SOLN: 250.0000 mL | INTRAVENOUS | Status: DC | PRN

## 2023-02-17 MED ORDER — TRAZODONE HCL 50 MG PO TABS
225.0000 mg | ORAL_TABLET | Freq: Every day | ORAL | Status: DC
  Administered 2023-02-17: 225 mg via ORAL
  Filled 2023-02-17: qty 1

## 2023-02-17 MED ORDER — NITROGLYCERIN 0.4 MG SL SUBL: 0.4000 mg | SUBLINGUAL_TABLET | SUBLINGUAL | Status: DC | PRN

## 2023-02-17 MED ORDER — MIDAZOLAM HCL 2 MG/2ML IJ SOLN
INTRAMUSCULAR | Status: AC
  Filled 2023-02-17: qty 2

## 2023-02-17 MED ORDER — ATORVASTATIN CALCIUM 80 MG PO TABS
80.0000 mg | ORAL_TABLET | Freq: Every day | ORAL | Status: DC
  Administered 2023-02-17 – 2023-02-18 (×2): 80 mg via ORAL
  Filled 2023-02-17 (×2): qty 1

## 2023-02-17 MED ORDER — SODIUM CHLORIDE 0.9 % WEIGHT BASED INFUSION: 1.0000 mL/kg/h | INTRAVENOUS | Status: DC

## 2023-02-17 MED ORDER — LIDOCAINE HCL (PF) 1 % IJ SOLN
INTRAMUSCULAR | Status: DC | PRN
  Administered 2023-02-17: 10 mL

## 2023-02-17 MED ORDER — SODIUM CHLORIDE 0.9% FLUSH: 3.0000 mL | INTRAVENOUS | Status: DC | PRN

## 2023-02-17 MED ORDER — MORPHINE SULFATE (PF) 2 MG/ML IV SOLN: 2.0000 mg | INTRAVENOUS | Status: DC | PRN

## 2023-02-17 MED ORDER — TRAZODONE HCL 50 MG PO TABS
225.0000 mg | ORAL_TABLET | Freq: Every evening | ORAL | Status: DC | PRN
  Administered 2023-02-17: 225 mg via ORAL
  Filled 2023-02-17: qty 5

## 2023-02-17 MED ORDER — SODIUM CHLORIDE 0.9% FLUSH
3.0000 mL | Freq: Two times a day (BID) | INTRAVENOUS | Status: DC
  Administered 2023-02-17 – 2023-02-18 (×2): 3 mL via INTRAVENOUS

## 2023-02-17 MED ORDER — FENTANYL CITRATE (PF) 100 MCG/2ML IJ SOLN
INTRAMUSCULAR | Status: AC
  Filled 2023-02-17: qty 2

## 2023-02-17 MED ORDER — TRAMADOL HCL 50 MG PO TABS
100.0000 mg | ORAL_TABLET | Freq: Once | ORAL | Status: AC
  Administered 2023-02-18: 100 mg via ORAL
  Filled 2023-02-17 (×2): qty 2

## 2023-02-17 MED ORDER — ASPIRIN 81 MG PO CHEW
81.0000 mg | CHEWABLE_TABLET | ORAL | Status: AC
  Administered 2023-02-17: 81 mg via ORAL
  Filled 2023-02-17: qty 1

## 2023-02-17 MED ORDER — TRAZODONE HCL 50 MG PO TABS: 50.0000 mg | ORAL_TABLET | Freq: Every evening | ORAL | Status: DC | PRN

## 2023-02-17 MED ORDER — ROPINIROLE HCL 1 MG PO TABS
3.0000 mg | ORAL_TABLET | Freq: Two times a day (BID) | ORAL | Status: DC
  Administered 2023-02-17 (×2): 3 mg via ORAL
  Filled 2023-02-17 (×3): qty 3

## 2023-02-17 MED ORDER — POTASSIUM CHLORIDE CRYS ER 20 MEQ PO TBCR
40.0000 meq | EXTENDED_RELEASE_TABLET | Freq: Once | ORAL | Status: AC
  Administered 2023-02-17: 40 meq via ORAL
  Filled 2023-02-17: qty 2

## 2023-02-17 MED ORDER — HEPARIN SODIUM (PORCINE) 1000 UNIT/ML IJ SOLN
INTRAMUSCULAR | Status: AC
  Filled 2023-02-17: qty 10

## 2023-02-17 MED ORDER — GABAPENTIN 100 MG PO CAPS
100.0000 mg | ORAL_CAPSULE | Freq: Three times a day (TID) | ORAL | Status: DC
  Administered 2023-02-17: 100 mg via ORAL
  Filled 2023-02-17 (×3): qty 1

## 2023-02-17 MED ORDER — TRAMADOL HCL 50 MG PO TABS
50.0000 mg | ORAL_TABLET | Freq: Three times a day (TID) | ORAL | Status: DC | PRN
  Filled 2023-02-17: qty 1

## 2023-02-17 MED ORDER — MIDAZOLAM HCL 2 MG/2ML IJ SOLN
INTRAMUSCULAR | Status: DC | PRN
  Administered 2023-02-17: 1 mg via INTRAVENOUS

## 2023-02-17 MED ORDER — SODIUM CHLORIDE 0.9% FLUSH
3.0000 mL | Freq: Two times a day (BID) | INTRAVENOUS | Status: DC
  Administered 2023-02-17 – 2023-02-18 (×3): 3 mL via INTRAVENOUS

## 2023-02-17 MED ORDER — HEPARIN (PORCINE) 25000 UT/250ML-% IV SOLN
850.0000 [IU]/h | INTRAVENOUS | Status: DC
  Administered 2023-02-17: 850 [IU]/h via INTRAVENOUS
  Filled 2023-02-17: qty 250

## 2023-02-17 MED ORDER — ASPIRIN 81 MG PO CHEW: 81.0000 mg | CHEWABLE_TABLET | ORAL | Status: DC

## 2023-02-17 MED ORDER — GABAPENTIN 300 MG PO CAPS
900.0000 mg | ORAL_CAPSULE | Freq: Three times a day (TID) | ORAL | Status: DC
  Administered 2023-02-17 – 2023-02-18 (×3): 900 mg via ORAL
  Filled 2023-02-17 (×3): qty 3

## 2023-02-17 MED ORDER — VERAPAMIL HCL 2.5 MG/ML IV SOLN
INTRA_ARTERIAL | Status: DC | PRN
  Administered 2023-02-17: 5 mL via INTRA_ARTERIAL

## 2023-02-17 MED ORDER — ACETAMINOPHEN 650 MG RE SUPP: 650.0000 mg | Freq: Four times a day (QID) | RECTAL | Status: DC | PRN

## 2023-02-17 MED ORDER — HEPARIN (PORCINE) IN NACL 1000-0.9 UT/500ML-% IV SOLN
INTRAVENOUS | Status: DC | PRN
  Administered 2023-02-17 (×2): 500 mL

## 2023-02-17 MED ORDER — SODIUM CHLORIDE 0.9 % WEIGHT BASED INFUSION: 3.0000 mL/kg/h | INTRAVENOUS | Status: DC

## 2023-02-17 MED ORDER — LIDOCAINE HCL (PF) 1 % IJ SOLN
INTRAMUSCULAR | Status: AC
  Filled 2023-02-17: qty 30

## 2023-02-17 MED ORDER — HEPARIN BOLUS VIA INFUSION
4000.0000 [IU] | Freq: Once | INTRAVENOUS | Status: AC
  Administered 2023-02-17: 4000 [IU] via INTRAVENOUS
  Filled 2023-02-17: qty 4000

## 2023-02-17 MED ORDER — FLUTICASONE PROPIONATE 50 MCG/ACT NA SUSP
1.0000 | Freq: Every day | NASAL | Status: DC | PRN
  Filled 2023-02-17: qty 16

## 2023-02-17 MED ORDER — HYDRALAZINE HCL 20 MG/ML IJ SOLN: 10.0000 mg | INTRAMUSCULAR | Status: AC | PRN

## 2023-02-17 MED ORDER — ONDANSETRON HCL 4 MG/2ML IJ SOLN: 4.0000 mg | Freq: Four times a day (QID) | INTRAMUSCULAR | Status: DC | PRN

## 2023-02-17 MED ORDER — VERAPAMIL HCL 2.5 MG/ML IV SOLN
INTRAVENOUS | Status: AC
  Filled 2023-02-17: qty 2

## 2023-02-17 MED ORDER — FENTANYL CITRATE (PF) 100 MCG/2ML IJ SOLN
INTRAMUSCULAR | Status: DC | PRN
  Administered 2023-02-17 (×2): 25 ug via INTRAVENOUS

## 2023-02-17 MED ORDER — NITROGLYCERIN 1 MG/10 ML FOR IR/CATH LAB
INTRA_ARTERIAL | Status: AC
  Filled 2023-02-17: qty 10

## 2023-02-17 MED ORDER — TRAMADOL HCL 50 MG PO TABS
50.0000 mg | ORAL_TABLET | Freq: Two times a day (BID) | ORAL | Status: DC | PRN
  Administered 2023-02-17 (×2): 50 mg via ORAL
  Filled 2023-02-17 (×2): qty 1

## 2023-02-17 SURGICAL SUPPLY — 17 items
BAND CMPR LRG ZPHR (HEMOSTASIS) ×1
BAND ZEPHYR COMPRESS 30 LONG (HEMOSTASIS) IMPLANT
CATH INFINITI 5 FR 3DRC (CATHETERS) IMPLANT
CATH INFINITI 5FR MULTPACK ANG (CATHETERS) IMPLANT
CATH OPTITORQUE TIG 4.0 5F (CATHETERS) IMPLANT
CLOSURE MYNX CONTROL 5F (Vascular Products) IMPLANT
GLIDESHEATH SLEND A-KIT 6F 22G (SHEATH) IMPLANT
KIT HEART LEFT (KITS) ×2 IMPLANT
PACK CARDIAC CATHETERIZATION (CUSTOM PROCEDURE TRAY) ×2 IMPLANT
PROTECTION STATION PRESSURIZED (MISCELLANEOUS) ×1
SHEATH PINNACLE 5F 10CM (SHEATH) IMPLANT
SHEATH PROBE COVER 6X72 (BAG) IMPLANT
STATION PROTECTION PRESSURIZED (MISCELLANEOUS) IMPLANT
TRANSDUCER W/STOPCOCK (MISCELLANEOUS) ×2 IMPLANT
TUBING CIL FLEX 10 FLL-RA (TUBING) ×2 IMPLANT
WIRE EMERALD 3MM-J .035X150CM (WIRE) IMPLANT
WIRE HI TORQ VERSACORE-J 145CM (WIRE) IMPLANT

## 2023-02-17 NOTE — ED Notes (Signed)
Dr. Velia Meyer at bedside

## 2023-02-17 NOTE — ED Notes (Signed)
Cardiology at bedside.

## 2023-02-17 NOTE — Progress Notes (Signed)
ANTICOAGULATION CONSULT NOTE - Initial Consult  Pharmacy Consult for Heparin Indication: chest pain/ACS  Allergies  Allergen Reactions   Thimerosal (Thiomersal) Swelling    In eyes, due to thimerosal in contact solution    Sulfa Antibiotics Other (See Comments)    Doesn't remember     Patient Measurements: Height: '5\' 9"'$  (175.3 cm) Weight: 70.3 kg (155 lb) IBW/kg (Calculated) : 66.2  Vital Signs: Temp: 98.5 F (36.9 C) (02/26 0439) Temp Source: Oral (02/26 0439) BP: 105/56 (02/26 0600) Pulse Rate: 68 (02/26 0600)  Labs: Recent Labs    02/17/23 0028 02/17/23 0214  HGB 12.1  --   HCT 35.2*  --   PLT 262  --   CREATININE 0.83  --   TROPONINIHS 19* 18*    Estimated Creatinine Clearance: 69.7 mL/min (by C-G formula based on SCr of 0.83 mg/dL).   Medical History: Past Medical History:  Diagnosis Date   Arthritis    Depression    Herpes    HLD (hyperlipidemia)    Scoliosis    Spondylolysis of cervical region     Medications:  No current facility-administered medications on file prior to encounter.   Current Outpatient Medications on File Prior to Encounter  Medication Sig Dispense Refill   amphetamine-dextroamphetamine (ADDERALL) 30 MG tablet Take 1 tablet by mouth 2 times daily 60 tablet 0   aspirin EC 81 MG tablet Take 81 mg by mouth daily. Swallow whole.     atorvastatin (LIPITOR) 80 MG tablet Take 80 mg by mouth daily.     cyclobenzaprine (FLEXERIL) 10 MG tablet Take 10 mg by mouth in the morning and at bedtime.     FLUoxetine (PROZAC) 40 MG capsule Take 80 mg by mouth daily.     fluticasone (FLONASE) 50 MCG/ACT nasal spray Place 1 spray into both nostrils daily as needed for allergies or rhinitis.     gabapentin (NEURONTIN) 100 MG capsule Take 1 capsule (100 mg total) by mouth 3 (three) times daily. 90 capsule 2   gabapentin (NEURONTIN) 300 MG capsule Take 900 mg by mouth 3 (three) times daily.     levocetirizine (XYZAL) 5 MG tablet Take 5 mg by mouth  daily as needed for allergies.     rOPINIRole (REQUIP) 3 MG tablet Take 3 mg by mouth in the morning and at bedtime.     traMADol (ULTRAM) 50 MG tablet Take 50 mg by mouth 3 (three) times daily as needed for severe pain.     traZODone (DESYREL) 150 MG tablet Take 225 mg by mouth at bedtime.     valACYclovir (VALTREX) 500 MG tablet Take 500 mg by mouth daily.     Vitamin D, Ergocalciferol, (DRISDOL) 1.25 MG (50000 UT) CAPS capsule Take 1 capsule (50,000 Units total) by mouth every 7 (seven) days. 12 capsule 1   albuterol (PROVENTIL HFA;VENTOLIN HFA) 108 (90 Base) MCG/ACT inhaler Inhale 2 puffs into the lungs every 4 (four) hours as needed for wheezing or shortness of breath. (Patient not taking: Reported on 02/17/2023) 1 Inhaler 3   amphetamine-dextroamphetamine (ADDERALL) 30 MG tablet 1 tablet Orally Twice a day 90 days (Patient not taking: Reported on 02/17/2023) 180 tablet 0   amphetamine-dextroamphetamine (ADDERALL) 30 MG tablet 1 tablet Orally Twice a day 30 days (Patient not taking: Reported on 02/17/2023) 60 tablet 0   diclofenac (VOLTAREN) 50 MG EC tablet Take 1 tablet (50 mg total) by mouth 2 (two) times daily. After a meal as needed for pain (Patient not taking:  Reported on 02/17/2023) 60 tablet 0   rOPINIRole (REQUIP) 3 MG tablet Take 1 tablet (3 mg total) by mouth at bedtime for 30 days. 30 tablet 2   Spacer/Aero-Holding Chambers (AEROCHAMBER PLUS WITH MASK) inhaler Use as instructed (Patient not taking: Reported on 04/13/2018) 1 each 2   [DISCONTINUED] amphetamine-dextroamphetamine (ADDERALL) 20 MG tablet Take 20 mg by mouth 2 (two) times daily. (Patient not taking: Reported on 02/17/2023)     [DISCONTINUED] FLUoxetine (PROZAC) 20 MG capsule Take 60 mg by mouth daily.  (Patient not taking: Reported on 02/17/2023)     [DISCONTINUED] pantoprazole (PROTONIX) 20 MG tablet  (Patient not taking: Reported on 02/17/2023)     [DISCONTINUED] QUEtiapine (SEROQUEL XR) 200 MG 24 hr tablet Take 100 mg by mouth  at bedtime. (Patient not taking: Reported on 02/17/2023)     [DISCONTINUED] QUEtiapine (SEROQUEL) 100 MG tablet Take 150 mg by mouth at bedtime. Take 1 and 1/2  (Patient not taking: Reported on 02/17/2023)       Assessment: 67 y.o. female with NSTEMI for heparinn Goal of Therapy:  Heparin level 0.3-0.7 units/ml Monitor platelets by anticoagulation protocol: Yes   Plan:  Heparin 4000 units IV bolus, then start heparin 850 units/hr Check heparin level in 8 hours.   Caryl Pina 02/17/2023,6:34 AM

## 2023-02-17 NOTE — Progress Notes (Signed)
  Echocardiogram 2D Echocardiogram has been performed.  Wynelle Link 02/17/2023, 5:26 PM

## 2023-02-17 NOTE — H&P (Signed)
History and Physical      Cynthia Best [id number] DOB: Jul 20, 1956 DOA: 02/17/2023  PCP: [facility]  Patient coming from: home   I have personally briefly reviewed patient's old medical records in [place]  Chief Complaint: Chest pain  HPI: Cynthia Best is a 67 y.o. female with medical history significant for chronic tobacco abuse, attention deficit disorder, chronic diastolic heart failure, chronic pain syndrome, who is admitted to [facility] on 02/17/2023 with chest pain after presenting from home to [facility] ED complaining of chest pain.   The patient reports 1 week of intermittent sharp substernal chest pain, with bandlike radiation across the bilateral aspects of the anterior chest wall, with occasional radiation into the left arm.  She reports that these episodes are transient, self-limited, spontaneously resolving without any pharmacologic intervention, including no interval use of nitroglycerin.  Reports that these episodes of chest pain are nonpositional, nonpleuritic, nonexertional, not reducible with palpation over the anterior chest wall.  She notes that she has never previously experienced any similar chest pain.  These episodes are not associated with any shortness of breath, nausea, vomiting, diaphoresis,  or palpitations.  However, she does report that one of the episodes of chest pain was associated with some dizziness, lightheadedness, in the absence of any formal syncope.  She notes that this episode of chest discomfort associated with dizziness lightheadedness, did not result in a fall.  She denies any recent subjective fever, chills, rigors, generalized myalgias.  No recent cough, hemoptysis, new lower extremity erythema, or calf tenderness.  No recent trauma or travel.  Denies any known history of underlying coronary artery disease, will otologic that she is a current smoker.  She also has a family history that is notable for her  father and brother experiencing myocardial infarctions in their early 58s.  Denies any recent preceding stress test.  Per chart review, most recent prior echocardiogram occurred in April 2016, it was notable for LVEF 55 to 60%, no focal motion analysis, grade 1 diastolic dysfunction, and mild mitral regurgitation.  She confirms that she has been completely chest pain-free throughout her ED course this evening.  She was brought to [facility] emergency department this evening via EMS for further evaluation management of the above intermittent episodes of chest pain.  She received a full dose aspirin via EMS and route to the ED.     ED Course:  Vital signs in the ED were notable for the following: Afebrile; heart rates in the [id number] to [id number]; systolic pressures in the 110s to 130s; respiratory rate 16-19, oxygen saturation 94 to 95% on room air.  Labs were notable for the following: BMP notable for the following: Potassium 3.5, creatinine 0.83, glucose 125.  High-sensitivity troponin I noted to be 19, with repeat value currently pending.  CBC notable for white blood cell count 6300, hemoglobin 12.1.  Per my interpretation, EKG in ED demonstrated the following: Shows sinus rhythm with heart rate 71, QTc of 490, otherwise normal intervals, nonspecific T wave inversion in aVL, no evidence of ST changes clinically no evidence of ST elevation.  Imaging and additional notable ED work-up: 2 view chest x-ray, per formal radiology read, shows no evidence of acute cardiopulmonary process, including no evidence of infiltrate, edema, effusion, or pneumothorax.  While in the ED, the following were administered: None  EDP discussed the patient's case with the on-call cardiology fellow, Dr. Patsey Berthold, Who recommends admission to the hospital service for further evaluation  management of presenting chest pain, and confirms that cardiology will formally consult, with additional recommendations pending at this  time.  Subsequently, the patient was admitted for overnight observation for further evaluation and management of presenting chest pain.     Review of Systems: As per HPI otherwise 10 point review of systems negative.   Past Medical History:  Diagnosis Date   Arthritis    Depression    Herpes    HLD (hyperlipidemia)    Scoliosis    Spondylolysis of cervical region     Past Surgical History:  Procedure Laterality Date   ABDOMINAL HYSTERECTOMY  01/14/2006   APPENDECTOMY  01/14/2005   Myoectomy     PLACEMENT OF BREAST IMPLANTS     VAGINAL PROLAPSE REPAIR  04/14/2006    Social History:  reports that she has been smoking cigarettes. She has a 5.78 pack-year smoking history. She quit smokeless tobacco use about 7 years ago. She reports current alcohol use. She reports that she does not use drugs.   Allergies  Allergen Reactions   Thimerosal (Thiomersal) Swelling    In eyes, due to thimerosal in contact solution    Sulfa Antibiotics Other (See Comments)    Doesn't remember     Family History  Problem Relation Age of Onset   Stroke Mother    Heart disease Father    Hyperlipidemia Brother    Diabetes Maternal Grandmother     Family history reviewed and not pertinent    Prior to Admission medications   Medication Sig Start Date End Date Taking? Authorizing Provider  albuterol (PROVENTIL HFA;VENTOLIN HFA) 108 (90 Base) MCG/ACT inhaler Inhale 2 puffs into the lungs every 4 (four) hours as needed for wheezing or shortness of breath. 04/14/18   Gildardo Pounds, NP  amphetamine-dextroamphetamine (ADDERALL) 20 MG tablet Take 20 mg by mouth 2 (two) times daily.    [provider]  amphetamine-dextroamphetamine (ADDERALL) 30 MG tablet 1 tablet Orally Twice a day 90 days 12/13/21     amphetamine-dextroamphetamine (ADDERALL) 30 MG tablet 1 tablet Orally Twice a day 30 days 01/18/22     amphetamine-dextroamphetamine (ADDERALL) 30 MG tablet Take 1 tablet by mouth 2 times daily  02/14/22     diclofenac (VOLTAREN) 50 MG EC tablet Take 1 tablet (50 mg total) by mouth 2 (two) times daily. After a meal as needed for pain 02/26/19   Fulp, Cammie, MD  FLUoxetine (PROZAC) 20 MG capsule Take 60 mg by mouth daily.     [provider]  gabapentin (NEURONTIN) 100 MG capsule Take 1 capsule (100 mg total) by mouth 3 (three) times daily. 02/26/19   Fulp, Cammie, MD  QUEtiapine (SEROQUEL XR) 200 MG 24 hr tablet Take 100 mg by mouth at bedtime.    [provider]  QUEtiapine (SEROQUEL) 100 MG tablet Take 150 mg by mouth at bedtime. Take 1 and 1/2     [provider]  rOPINIRole (REQUIP) 3 MG tablet Take 1 tablet (3 mg total) by mouth at bedtime for 30 days. 02/26/19 03/28/19  Antony Blackbird, MD  Spacer/Aero-Holding Chambers (AEROCHAMBER PLUS WITH MASK) inhaler Use as instructed Patient not taking: Reported on 04/13/2018 06/21/17   Muthersbaugh, Jarrett Soho, PA-C  Vitamin D, Ergocalciferol, (DRISDOL) 1.25 MG (50000 UT) CAPS capsule Take 1 capsule (50,000 Units total) by mouth every 7 (seven) days. 12/13/18   Gildardo Pounds, NP     Objective    Physical Exam: Vitals:   02/17/23 0130 02/17/23 0200 02/17/23 0300 02/17/23  0315  BP: 109/67 114/72 134/84   Pulse: 75 73 76 71  Resp: '19 18  13  '$ Temp:      TempSrc:      SpO2: 93% 94% 95% 94%  Weight:      Height:        General: appears to be stated age; alert, oriented Skin: warm, dry, no rash Head:  AT/[place] Mouth:  Oral mucosa membranes appear moist, normal dentition Neck: supple; trachea midline Heart:  RRR; did not appreciate any M/R/G Lungs: CTAB, did not appreciate any wheezes, rales, or rhonchi Abdomen: + BS; soft, ND, NT Vascular: 2+ pedal pulses b/l; 2+ radial pulses b/l Extremities: no peripheral edema, no muscle wasting Neuro: strength and sensation intact in upper and lower extremities b/l     Labs on Admission: I have personally reviewed following labs and imaging studies  CBC: Recent Labs  Lab  02/17/23 0028  WBC 6.3  HGB 12.1  HCT 35.2*  MCV 94.1  PLT [id number]   Basic Metabolic Panel: Recent Labs  Lab 02/17/23 0028  NA 139  K 3.7  CL 104  CO2 25  GLUCOSE 125*  BUN 16  CREATININE 0.83  CALCIUM 9.2   GFR: Estimated Creatinine Clearance: 69.7 mL/min (by C-G formula based on SCr of 0.83 mg/dL). Liver Function Tests: No results for input(s): "AST", "ALT", "ALKPHOS", "BILITOT", "PROT", "ALBUMIN" in the last 168 hours. No results for input(s): "LIPASE", "AMYLASE" in the last 168 hours. No results for input(s): "AMMONIA" in the last 168 hours. Coagulation Profile: No results for input(s): "INR", "PROTIME" in the last 168 hours. Cardiac Enzymes: No results for input(s): "CKTOTAL", "CKMB", "CKMBINDEX", "TROPONINI" in the last 168 hours. BNP (last 3 results) No results for input(s): "PROBNP" in the last 8760 hours. HbA1C: No results for input(s): "HGBA1C" in the last 72 hours. CBG: No results for input(s): "GLUCAP" in the last 168 hours. Lipid Profile: No results for input(s): "CHOL", "HDL", "LDLCALC", "TRIG", "CHOLHDL", "LDLDIRECT" in the last 72 hours. Thyroid Function Tests: No results for input(s): "TSH", "T4TOTAL", "FREET4", "T3FREE", "THYROIDAB" in the last 72 hours. Anemia Panel: No results for input(s): "VITAMINB12", "FOLATE", "FERRITIN", "TIBC", "IRON", "RETICCTPCT" in the last 72 hours. Urine analysis:    Component Value Date/Time   COLORURINE YELLOW 10/30/2014 1309   APPEARANCEUR CLEAR 10/30/2014 1309   LABSPEC 1.011 10/30/2014 1309   PHURINE 5.5 10/30/2014 1309   GLUCOSEU NEGATIVE 10/30/2014 1309   HGBUR NEGATIVE 10/30/2014 1309   BILIRUBINUR negative 11/11/2017 [place] 10/30/2014 1309   PROTEINUR negative 11/11/2017 1644   PROTEINUR NEGATIVE 10/30/2014 1309   UROBILINOGEN 0.2 11/11/2017 1644   UROBILINOGEN 0.2 10/30/2014 1309   NITRITE negative 11/11/2017 1644   NITRITE NEGATIVE 10/30/2014 1309   LEUKOCYTESUR Negative 11/11/2017  1644    Radiological Exams on Admission: DG Chest 2 View  Result Date: 02/17/2023 CLINICAL DATA:  Intermittent chest pain. EXAM: CHEST - 2 VIEW COMPARISON:  April 13, 2018 FINDINGS: The heart size and mediastinal contours are within normal limits. The lungs are hyperinflated. Both lungs are clear. Moderate severity dextroscoliosis of the mid and lower thoracic spine is seen. IMPRESSION: No active cardiopulmonary disease. Electronically Signed   By: Virgina Norfolk M.D.   On: 02/17/2023 01:22      Assessment/Plan   Principal Problem:   Atypical chest pain Active Problems:   Tobacco abuse in remission   ADD (attention deficit disorder)   Chronic diastolic CHF (congestive heart failure) (HCC)   Chronic pain  syndrome     #) Atypical Chest Pain: 1 week of intermittent sharp substernal chest discomfort with bandlike radiation to the bilateral anterior aspects of the chest wall, which has been nonexertional, and resolve spontaneously in the absence of any nitroglycerin.  Patient appears to be atypical for ACS; however, given the presence of some typical characteristics in this patient with multiple CAD risk factors that include being a current smoker, along with a family history of this positive for premature CAD, no recent prior cardiac ischemic evaluation, and a presenting HEART score of 6 conveying a moderate risk of major acute cardiac event over the ensuing 6 weeks, the decision was made to admit patient for overnight observation in order to rule-out ACS.   Of note, Troponin x 1 was found to be 19, with repeat value currently pending. EKG shows no evidence of acute ischemic changes, including no evidence of STEMI, and CXR showed no acute CP process, including no evidence of pneumothorax. Patient currently CP free. Full dose aspirin was administered via EMS this evening.   EDP is discussed patient's case with the on-call cardiology fellow, Dr. Patsey Berthold, Who will formally consult and see the  patient this evening, with additional recommendations pending at this time.  Aside from ACS, differential also includes non-cardiac etiologies including musculoskeletal possibilities such as costochondritis, GI sources including GERD vs esophagitis vs gastritis vs PUD noting that the patient drinks 1 to 2 glasses of wine per day and is also on prn NSAIDs at home.  Will also add on lipase.  Differential also includes esophageal spasm.  Presentation is clinically less suggestive of acute PE.    Plan: trend serial troponin. Monitor on telemetry. PRN sublingual nitroglycerin. PRN EKG for subsequent episodes of chest pain. Check serum Mg level and repeat BMP in the morning, with prn supplementation to maintain Mg and potassium levels greater than or equal to 2.0 and 4.0, respectively, to further reduce risk of ventricular arrhythmia.  Potassium chloride 40 mEq p.o. x 1 dose now.  Repeat CBC in the AM.  Add on lipase.  Cardiology to formally consult, as above.  Echocardiogram ordered for the morning.  Check urinary drug screen.             #) Chronic tobacco abuse: Patient conveys that they are a current smoker, having smoked approximately one third ppd for nearly 20 years years.   Plan: Counseled the patient for less than 2 minutes on the importance of complete smoking discontinuation.  Order placed for prn nicotine patch for use during this hospitalization.             #) Attention deficit disorder: Documented history of such, on Adderall as an outpatient.  Plan: Will hold home Adderall for now.               #) Chronic diastolic heart failure: documented history of such, with most recent echocardiogram performed in April 2016, which is notable for LVEF 55 to 60% as well as grade 1 diastolic dysfunction. No clinical or radiographic evidence to suggest acutely decompensated heart failure at this time. home diuretic regimen reportedly consists of the following: None.     Plan: monitor strict I's & O's and daily weights. Repeat CMP in AM. Check serum mag level. Continue home diuretic regimen.  Add on BNP.             #) Chronic pain syndrome: Documented history as such, including neck pain, for which she is on prn tramadol as  an outpatient.  Plan: Resume home as needed tramadol.         DVT prophylaxis: SCD's   Code Status: Full code Family Communication: none Disposition Plan: Per Rounding Team Consults called: EDP discussed patient's case with on-call cardiology fellow, Dr. Patsey Berthold, As further detailed above;  Admission status: Patient     I SPENT GREATER THAN 75  MINUTES IN CLINICAL CARE TIME/MEDICAL DECISION-MAKING IN COMPLETING THIS ADMISSION.      Wise DO [facility]  From [place]   02/17/2023, 3:34 AM

## 2023-02-17 NOTE — ED Triage Notes (Signed)
Patient arrived via [facility] from home with complaints intermittent center chest pain that radiates to both arms, associated with dizziness and near syncope, onset around 2100 tonight. Patient states she had same episode last Monday while shopping for groceries, was not seen for. Patient denies chest at this time. EMS given '324mg'$  aspirin en-route. Patient alerts and oriented x4 at this time.   Per EMS: BP-118/60 HR-86 sinus RR-18 Spo2-98% on room air CBG-96

## 2023-02-17 NOTE — ED Provider Notes (Signed)
[facility] Provider Note   CSN: [id number] Arrival date & time: 02/17/23  0004     History  Chief Complaint  Patient presents with   Chest Pain    Cynthia Best is a 67 y.o. female.  HPI     This is a 67 year old female with a history of hyperlipidemia and smoking who presents with chest discomfort and near syncope.  Patient reports that she almost passed out on Monday while in the grocery store.  Since that time she has had pain that radiates from the center of her chest into the right arm.  She also reports a bandlike pressure over the middle of her chest.  She does not associate it with exertion but it does improve with rest.  She has associated lightheadedness and dizziness.  She states she had an episode tonight that "did not resolve on its own."  She is not currently having any chest discomfort.  She reports a significant family history of heart disease including father and brother who died of heart attack in their early 81s.  Home Medications Prior to Admission medications   Medication Sig Start Date End Date Taking? Authorizing Provider  albuterol (PROVENTIL HFA;VENTOLIN HFA) 108 (90 Base) MCG/ACT inhaler Inhale 2 puffs into the lungs every 4 (four) hours as needed for wheezing or shortness of breath. 04/14/18   Gildardo Pounds, NP  amphetamine-dextroamphetamine (ADDERALL) 20 MG tablet Take 20 mg by mouth 2 (two) times daily.    [provider]  amphetamine-dextroamphetamine (ADDERALL) 30 MG tablet 1 tablet Orally Twice a day 90 days 12/13/21     amphetamine-dextroamphetamine (ADDERALL) 30 MG tablet 1 tablet Orally Twice a day 30 days 01/18/22     amphetamine-dextroamphetamine (ADDERALL) 30 MG tablet Take 1 tablet by mouth 2 times daily 02/14/22     diclofenac (VOLTAREN) 50 MG EC tablet Take 1 tablet (50 mg total) by mouth 2 (two) times daily. After a meal as needed for pain 02/26/19   Fulp, Cammie, MD  FLUoxetine (PROZAC) 20  MG capsule Take 60 mg by mouth daily.     [provider]  gabapentin (NEURONTIN) 100 MG capsule Take 1 capsule (100 mg total) by mouth 3 (three) times daily. 02/26/19   Fulp, Cammie, MD  QUEtiapine (SEROQUEL XR) 200 MG 24 hr tablet Take 100 mg by mouth at bedtime.    [provider]  QUEtiapine (SEROQUEL) 100 MG tablet Take 150 mg by mouth at bedtime. Take 1 and 1/2     [provider]  rOPINIRole (REQUIP) 3 MG tablet Take 1 tablet (3 mg total) by mouth at bedtime for 30 days. 02/26/19 03/28/19  Antony Blackbird, MD  Spacer/Aero-Holding Chambers (AEROCHAMBER PLUS WITH MASK) inhaler Use as instructed Patient not taking: Reported on 04/13/2018 06/21/17   Muthersbaugh, Jarrett Soho, PA-C  Vitamin D, Ergocalciferol, (DRISDOL) 1.25 MG (50000 UT) CAPS capsule Take 1 capsule (50,000 Units total) by mouth every 7 (seven) days. 12/13/18   Gildardo Pounds, NP      Allergies    Thimerosal (thiomersal) and Sulfa antibiotics    Review of Systems   Review of Systems  Constitutional:  Negative for fever.  Respiratory:  Negative for shortness of breath.   Cardiovascular:  Positive for chest pain.  Neurological:  Positive for light-headedness.  All other systems reviewed and are negative.   Physical Exam Updated Vital Signs BP 114/72   Pulse 73   Temp 97.6 F (36.4 C) (Oral)  Resp 18   Ht 1.753 m ('5\' 9"'$ )   Wt 70.3 kg   SpO2 94%   BMI 22.89 kg/m  Physical Exam Vitals and nursing note reviewed.  Constitutional:      Appearance: She is well-developed. She is obese. She is not ill-appearing.  HENT:     Head: Normocephalic and atraumatic.  Eyes:     Pupils: Pupils are equal, round, and reactive to light.  Cardiovascular:     Rate and Rhythm: Normal rate and regular rhythm.     Heart sounds: Normal heart sounds.  Pulmonary:     Effort: Pulmonary effort is normal. No respiratory distress.     Breath sounds: No wheezing.  Abdominal:     General: Bowel sounds are normal.      Palpations: Abdomen is soft.  Musculoskeletal:     Cervical back: Neck supple.     Right lower leg: No edema.     Left lower leg: No edema.  Skin:    General: Skin is warm and dry.  Neurological:     General: No focal deficit present.     Mental Status: She is alert and oriented to person, place, and time.     ED Results / Procedures / Treatments   Labs (all labs ordered are listed, but only abnormal results are displayed) Labs Reviewed  BASIC METABOLIC PANEL - Abnormal; Notable for the following components:      Result Value   Glucose, Bld 125 (*)    All other components within normal limits  CBC - Abnormal; Notable for the following components:   RBC 3.74 (*)    HCT 35.2 (*)    All other components within normal limits  TROPONIN I (HIGH SENSITIVITY) - Abnormal; Notable for the following components:   Troponin I (High Sensitivity) 19 (*)    All other components within normal limits  TROPONIN I (HIGH SENSITIVITY)    EKG EKG Interpretation  Date/Time:  Monday February 17 2023 00:00:47 EST Ventricular Rate:  79 PR Interval:  187 QRS Duration: 97 QT Interval:  408 QTC Calculation: 468 R Axis:   46 Text Interpretation: Sinus rhythm Abnormal inferior Q waves Minimal ST depression, lateral leads Baseline wander in lead(s) III Inferior Q waves new when compared to prior Confirmed by Thayer Jew [phone]) on 02/17/2023 12:40:59 AM  Radiology DG Chest 2 View  Result Date: 02/17/2023 CLINICAL DATA:  Intermittent chest pain. EXAM: CHEST - 2 VIEW COMPARISON:  April 13, 2018 FINDINGS: The heart size and mediastinal contours are within normal limits. The lungs are hyperinflated. Both lungs are clear. Moderate severity dextroscoliosis of the mid and lower thoracic spine is seen. IMPRESSION: No active cardiopulmonary disease. Electronically Signed   By: Virgina Norfolk M.D.   On: 02/17/2023 01:22    Procedures Procedures    Medications Ordered in ED Medications - No data to  display  ED Course/ Medical Decision Making/ A&P                             Medical Decision Making Amount and/or Complexity of Data Reviewed Labs: ordered. Radiology: ordered.  Risk Decision regarding hospitalization.   This patient presents to the ED for concern of chest pain, this involves an extensive number of treatment options, and is a complaint that carries with it a high risk of complications and morbidity.  I considered the following differential and admission for this acute, potentially life threatening condition.  The differential diagnosis includes ACS, PE, pneumothorax, pneumonia  MDM:    This is a 67 year old female who presents with chest discomfort.  She is overall nontoxic.  She is currently pain-free.  Currently her vital signs are reassuring.  EKG shows inferior Q waves which are new when compared to prior.  There is no ST elevation or evidence of acute ischemia.  I do feel her story is somewhat concerning given description of pain and improvement with rest.  She also has risk factors including smoking, early family history, and hypercholesterolemia.  She has not had any cardiac evaluation.  She was given aspirin en route.  Labs obtained.  Normal renal function.  Initial troponin is 19 which is slightly elevated.  This would put her heart score at 6-7.  Feel that this warrants admission for further workup.  Will consult with cardiology.  (Labs, imaging, consults)  Labs: I Ordered, and personally interpreted labs.  The pertinent results include: CBC, BMP, troponin  Imaging Studies ordered: I ordered imaging studies including chest x-ray I independently visualized and interpreted imaging. I agree with the radiologist interpretation  Additional history obtained from friend at bedside.  External records from outside source obtained and reviewed including prior evaluations  Cardiac Monitoring: The patient was maintained on a cardiac monitor.  If on the cardiac  monitor, I personally viewed and interpreted the cardiac monitored which showed an underlying rhythm of: Sinus  Reevaluation: After the interventions noted above, I reevaluated the patient and found that they have :stayed the same  Social Determinants of Health:  lives independently, smoker  Disposition:   Admit Co morbidities that complicate the patient evaluation  Past Medical History:  Diagnosis Date   Arthritis    Depression    Herpes    Scoliosis    Spondylolysis of cervical region      Medicines No orders of the defined types were placed in this encounter.   I have reviewed the patients home medicines and have made adjustments as needed  Problem List / ED Course: Problem List Items Addressed This Visit   None Visit Diagnoses     ACS (acute coronary syndrome) ([place])    -  Primary                   Final Clinical Impression(s) / ED Diagnoses Final diagnoses:  ACS (acute coronary syndrome) [facility])    Rx / DC Orders ED Discharge Orders     None         Kelissa Merlin, Barbette Hair, MD 02/17/23 [phone]

## 2023-02-17 NOTE — Consult Note (Addendum)
Cardiology Consultation   Patient ID: Cynthia Best MRN: [id number]; DOB: [birth year]  Admit date: 02/17/2023 Date of Consult: 02/17/2023  PCP:  [place] [place] [facility] Cardiologist:  None        Patient Profile:   Cynthia Best is a 67 y.o. female with a hx of smoking, FHx of CAD, HLD, GERD, ADHD, cervical radiculopathy who is being seen 02/17/2023 for the evaluation of chest pain at the request of the ED.  History of Present Illness:   [name] states that over the past week she has been having intermittent episodes of chest pain. On Monday while she was at the grocery shop she had a presyncopal event while she was about to pay. Shortly after she developed a pressure like sensation across the chest, radiating to both arms that lasted approx 10 min and the spontaneously resolved. Her symptoms did not recur until Thursday afternoon but this time it was described as GI upset associated with nausea. That lasted approx 3 hrs and self resolved. Last night, she developed another presyncopal episode but this time associated with a 10/10 retrosternal pressure like sensation radiating to both arms. This lasted approx 20 min and resolved. Because of this reason, her friend called 911 and she was brought to the ER. Chest pain free on my evaluation. She has a strong FHx for early CAD in dad, brother and uncle. All died of myocardial infarction in their 36s. On arrival her ECG showed ischemic changes and troponins was mildly elevated.    Past Medical History:  Diagnosis Date   Arthritis    Depression    Herpes    HLD (hyperlipidemia)    Scoliosis    Spondylolysis of cervical region     Past Surgical History:  Procedure Laterality Date   ABDOMINAL HYSTERECTOMY  01/14/2006   APPENDECTOMY  01/14/2005   Myoectomy     PLACEMENT OF BREAST IMPLANTS     VAGINAL PROLAPSE REPAIR  04/14/2006     Home Medications:  Prior to Admission medications    Medication Sig Start Date End Date Taking? Authorizing Provider  albuterol (PROVENTIL HFA;VENTOLIN HFA) 108 (90 Base) MCG/ACT inhaler Inhale 2 puffs into the lungs every 4 (four) hours as needed for wheezing or shortness of breath. 04/14/18   Gildardo Pounds, NP  amphetamine-dextroamphetamine (ADDERALL) 20 MG tablet Take 20 mg by mouth 2 (two) times daily.    [provider]  amphetamine-dextroamphetamine (ADDERALL) 30 MG tablet 1 tablet Orally Twice a day 90 days 12/13/21     amphetamine-dextroamphetamine (ADDERALL) 30 MG tablet 1 tablet Orally Twice a day 30 days 01/18/22     amphetamine-dextroamphetamine (ADDERALL) 30 MG tablet Take 1 tablet by mouth 2 times daily 02/14/22     diclofenac (VOLTAREN) 50 MG EC tablet Take 1 tablet (50 mg total) by mouth 2 (two) times daily. After a meal as needed for pain 02/26/19   Fulp, Cammie, MD  FLUoxetine (PROZAC) 20 MG capsule Take 60 mg by mouth daily.     [provider]  gabapentin (NEURONTIN) 100 MG capsule Take 1 capsule (100 mg total) by mouth 3 (three) times daily. 02/26/19   Fulp, Cammie, MD  QUEtiapine (SEROQUEL XR) 200 MG 24 hr tablet Take 100 mg by mouth at bedtime.    [provider]  QUEtiapine (SEROQUEL) 100 MG tablet Take 150 mg by mouth at bedtime. Take 1 and 1/2     [provider]  rOPINIRole (REQUIP) 3 MG tablet Take 1 tablet (3 mg total) by mouth at bedtime for 30 days. 02/26/19 03/28/19  Antony Blackbird, MD  Spacer/Aero-Holding Chambers (AEROCHAMBER PLUS WITH MASK) inhaler Use as instructed Patient not taking: Reported on 04/13/2018 06/21/17   Muthersbaugh, Jarrett Soho, PA-C  Vitamin D, Ergocalciferol, (DRISDOL) 1.25 MG (50000 UT) CAPS capsule Take 1 capsule (50,000 Units total) by mouth every 7 (seven) days. 12/13/18   Gildardo Pounds, NP    Inpatient Medications: Scheduled Meds:  gabapentin  100 mg Oral TID   potassium chloride  40 mEq Oral Once   Continuous Infusions:  PRN Meds: acetaminophen **OR**  acetaminophen, melatonin, nitroGLYCERIN, traMADol  Allergies:    Allergies  Allergen Reactions   Thimerosal (Thiomersal) Swelling    In eyes, due to thimerosal in contact solution    Sulfa Antibiotics Other (See Comments)    Doesn't remember     Social History:   Social History   Socioeconomic History   Marital status: Legally Separated    Spouse name: Not on file   Number of children: Not on file   Years of education: Not on file   Highest education level: Not on file  Occupational History   Not on file  Tobacco Use   Smoking status: Every Day    Packs/day: 0.34    Years: 17.00    Total pack years: 5.78    Types: Cigarettes   Smokeless tobacco: Former    Quit date: 03/07/2015  Vaping Use   Vaping Use: Never used  Substance and Sexual Activity   Alcohol use: Yes    Alcohol/week: 0.0 standard drinks of alcohol    Comment: 2 glass wine daily   Drug use: No   Sexual activity: Not Currently  Other Topics Concern   Not on file  Social History Narrative   Not on file   Social Determinants of Health   Financial Resource Strain: Not on file  Food Insecurity: Not on file  Transportation Needs: Not on file  Physical Activity: Not on file  Stress: Not on file  Social Connections: Not on file  Intimate Partner Violence: Not on file    Family History:    Family History  Problem Relation Age of Onset   Stroke Mother    Heart disease Father    Hyperlipidemia Brother    Diabetes Maternal Grandmother      ROS:  Please see the history of present illness.   All other ROS reviewed and negative.     Physical Exam/Data:   Vitals:   02/17/23 0130 02/17/23 0200 02/17/23 0300 02/17/23 0315  BP: 109/67 114/72 134/84   Pulse: 75 73 76 71  Resp: '19 18  13  '$ Temp:      TempSrc:      SpO2: 93% 94% 95% 94%  Weight:      Height:       No intake or output data in the 24 hours ending 02/17/23 0331    02/17/2023   12:16 AM 02/26/2019    3:20 PM 12/11/2018    3:23 PM   Last 3 Weights  Weight (lbs) 155 lb 156 lb 154 lb  Weight (kg) 70.308 kg 70.761 kg 69.854 kg     Body mass index is 22.89 kg/m.  General:  Well nourished, well developed, in no acute distress HEENT: normal Neck: no JVD Vascular: No carotid bruits; Distal pulses 2+ bilaterally Cardiac:  normal S1, S2; RRR; no murmur  Lungs:  clear  to auscultation bilaterally, no wheezing, rhonchi or rales  Abd: soft, nontender, no hepatomegaly  Ext: no edema Musculoskeletal:  No deformities, BUE and BLE strength normal and equal Skin: warm and dry  Neuro:  CNs 2-12 intact, no focal abnormalities noted Psych:  Normal affect   EKG:  The EKG was personally reviewed and demonstrates:  NSR, ischemic ST changes Telemetry:  Telemetry was personally reviewed and demonstrates:    Relevant CV Studies:  Laboratory Data:  High Sensitivity Troponin:   Recent Labs  Lab 02/17/23 0028 02/17/23 0214  TROPONINIHS 19* 18*     Chemistry Recent Labs  Lab 02/17/23 0028  NA 139  K 3.7  CL 104  CO2 25  GLUCOSE 125*  BUN 16  CREATININE 0.83  CALCIUM 9.2  GFRNONAA >60  ANIONGAP 10    No results for input(s): "PROT", "ALBUMIN", "AST", "ALT", "ALKPHOS", "BILITOT" in the last 168 hours. Lipids No results for input(s): "CHOL", "TRIG", "HDL", "LABVLDL", "LDLCALC", "CHOLHDL" in the last 168 hours.  Hematology Recent Labs  Lab 02/17/23 0028  WBC 6.3  RBC 3.74*  HGB 12.1  HCT 35.2*  MCV 94.1  MCH 32.4  MCHC 34.4  RDW 12.8  PLT 262   Thyroid No results for input(s): "TSH", "FREET4" in the last 168 hours.  BNPNo results for input(s): "BNP", "PROBNP" in the last 168 hours.  DDimer No results for input(s): "DDIMER" in the last 168 hours.   Radiology/Studies:  DG Chest 2 View  Result Date: 02/17/2023 CLINICAL DATA:  Intermittent chest pain. EXAM: CHEST - 2 VIEW COMPARISON:  April 13, 2018 FINDINGS: The heart size and mediastinal contours are within normal limits. The lungs are hyperinflated. Both  lungs are clear. Moderate severity dextroscoliosis of the mid and lower thoracic spine is seen. IMPRESSION: No active cardiopulmonary disease. Electronically Signed   By: Virgina Norfolk M.D.   On: 02/17/2023 01:22     Assessment and Plan:   MARGEART VARNELL is a 67 y.o. female with a hx of smoking, FHx of CAD, HLD, GERD, ADHD, cervical radiculopathy admitted with NSTEMI. Currently chest pain free. Ischemic ST changes are improved on repeat ECG. Will keep patient NPO for [facility] tomorrow.  NSTEMI Active smoker Family history of CAD Hyperlipidemia ADHD GERD  Plan: - Loaded with ASA prior to arrival. Continue ASA 81 mg daily - Heparin drip per ACS protocol - Lopressor 25 mg BID - Sublingual NTG PRN - HbA1c, Lipid panel, Lp(a) - Atorvastatin 80 mg daily - Keep patient NPO - LHC in am - Echocardiogram in am   Risk Assessment/Risk Scores:     TIMI Risk Score for Unstable Angina or Non-ST Elevation MI:   The patient's TIMI risk score is 4% which indicates a 20% risk of all cause mortality, new or recurrent myocardial infarction or need for urgent revascularization in the next 14 days.          For questions or updates, please contact [place] Please consult [email or web address] for contact info under    Signed, Ewing Schlein, MD  02/17/2023 3:31 AM  Patient seen and examined. Agree with assessment and plan.  Patient is currently pain-free in the emergency room.  No shortness of breath.  Her chest pain yesterday described as substernal chest tightness and pressure rating to her back worrisome for angina.  She has a strong family history for CAD and 16-year history of tobacco use, started smoking at age 41.  No JVD.  Lungs clear.  Rhythm  regular without ectopy.  No significant murmur.  Abdomen soft nontender.  2+ right radial artery pulse.  No edema.  ECG is unremarkable and troponins are negative.  I discussed the catheterization procedure in detail with the patient. I  have reviewed the risks, indications, and alternatives to cardiac catheterization, possible angioplasty, and stenting with the patient. Risks include but are not limited to bleeding, infection, vascular injury, stroke, myocardial infection, arrhythmia, kidney injury, radiation-related injury in the case of prolonged fluoroscopy use, emergency cardiac surgery, and death. The patient understands the risks of serious complication is 1-2 in [id number] with diagnostic cardiac cath and 1-2% or less with angioplasty/stenting.  Plan for cardiac catheterization later this morning with Dr. Gwenlyn Found.   Troy Sine, MD, [facility] 02/17/2023 8:13 AM

## 2023-02-17 NOTE — Progress Notes (Signed)
TRH Update:  Starting heparin drip at this time per cardiology recommendation.     Babs Bertin, DO Hospitalist

## 2023-02-17 NOTE — Progress Notes (Signed)
  INTERVAL PROGRESS NOTE    CATI UHLE- 67 y.o. female  LOS: 0 __________________________________________________________________  SUBJECTIVE: Admitted 02/17/2023 with cc of  Chief Complaint  Patient presents with   Chest Pain  Consistent with ACS.   Since admission, remains hemodynamically stable  OBJECTIVE: Blood pressure (!) 108/54, pulse 66, temperature 98.5 F (36.9 C), temperature source Oral, resp. rate 14, height 5' 9"$  (1.753 m), weight 70.3 kg, SpO2 100 %.   ASSESSMENT/PLAN:  I have reviewed the full H&P by Dr. Velia Meyer, and I agree with the assessment and plan as outlined therein. In addition: Scheduled for L heart cath today.  - continue heparin gtt - further recs per cardiology  - ASA  - lopressor  - atorvastatin  - echo   Principal Problem:   Atypical chest pain Active Problems:   Tobacco abuse in remission   ADD (attention deficit disorder)   Chronic diastolic CHF (congestive heart failure) ([place])   Chronic pain syndrome  Richarda Osmond, DO [facility] 02/17/2023, 12:45 PM    [email or web address] Available by Epic secure chat 7AM-7PM. If 7PM-7AM, please contact night-coverage   No Charge

## 2023-02-17 NOTE — Interval H&P Note (Signed)
Cath Lab Visit (complete for each Cath Lab visit)  Clinical Evaluation Leading to the Procedure:   ACS: Yes.    Non-ACS:    Anginal Classification: CCS II  Anti-ischemic medical therapy: No Therapy  Non-Invasive Test Results: No non-invasive testing performed  Prior CABG: No previous CABG      History and Physical Interval Note:  02/17/2023 9:55 AM  Cynthia Best  has presented today for surgery, with the diagnosis of unstable angina.  The various methods of treatment have been discussed with the patient and family. After consideration of risks, benefits and other options for treatment, the patient has consented to  Procedure(s): LEFT HEART CATH AND CORONARY ANGIOGRAPHY (N/A) as a surgical intervention.  The patient's history has been reviewed, patient examined, no change in status, stable for surgery.  I have reviewed the patient's chart and labs.  Questions were answered to the patient's satisfaction.     Quay Burow

## 2023-02-17 NOTE — H&P (View-Only) (Signed)
Cardiology Consultation   Patient ID: Cynthia Best MRN: [id number]; DOB: 09/05/1956  Admit date: 02/17/2023 Date of Consult: 02/17/2023  PCP:  [place] Cardiologist:  None        Patient Profile:   Cynthia Best is a 67 y.o. female with a hx of smoking, FHx of CAD, HLD, GERD, ADHD, cervical radiculopathy who is being seen 02/17/2023 for the evaluation of chest pain at the request of the ED.  History of Present Illness:   Cynthia Best states that over the past week she has been having intermittent episodes of chest pain. On Monday while she was at the grocery shop she had a presyncopal event while she was about to pay. Shortly after she developed a pressure like sensation across the chest, radiating to both arms that lasted approx 10 min and the spontaneously resolved. Her symptoms did not recur until Thursday afternoon but this time it was described as GI upset associated with nausea. That lasted approx 3 hrs and self resolved. Last night, she developed another presyncopal episode but this time associated with a 10/10 retrosternal pressure like sensation radiating to both arms. This lasted approx 20 min and resolved. Because of this reason, her friend called 911 and she was brought to the ER. Chest pain free on my evaluation. She has a strong FHx for early CAD in dad, brother and uncle. All died of myocardial infarction in their 58s. On arrival her ECG showed ischemic changes and troponins was mildly elevated.    Past Medical History:  Diagnosis Date   Arthritis    Depression    Herpes    HLD (hyperlipidemia)    Scoliosis    Spondylolysis of cervical region     Past Surgical History:  Procedure Laterality Date   ABDOMINAL HYSTERECTOMY  01/14/2006   APPENDECTOMY  01/14/2005   Myoectomy     PLACEMENT OF BREAST IMPLANTS     VAGINAL PROLAPSE REPAIR  04/14/2006     Home Medications:  Prior to Admission medications    Medication Sig Start Date End Date Taking? Authorizing Provider  albuterol (PROVENTIL HFA;VENTOLIN HFA) 108 (90 Base) MCG/ACT inhaler Inhale 2 puffs into the lungs every 4 (four) hours as needed for wheezing or shortness of breath. 04/14/18   Gildardo Pounds, NP  amphetamine-dextroamphetamine (ADDERALL) 20 MG tablet Take 20 mg by mouth 2 (two) times daily.    [provider]  amphetamine-dextroamphetamine (ADDERALL) 30 MG tablet 1 tablet Orally Twice a day 90 days 12/13/21     amphetamine-dextroamphetamine (ADDERALL) 30 MG tablet 1 tablet Orally Twice a day 30 days 01/18/22     amphetamine-dextroamphetamine (ADDERALL) 30 MG tablet Take 1 tablet by mouth 2 times daily 02/14/22     diclofenac (VOLTAREN) 50 MG EC tablet Take 1 tablet (50 mg total) by mouth 2 (two) times daily. After a meal as needed for pain 02/26/19   Fulp, Cammie, MD  FLUoxetine (PROZAC) 20 MG capsule Take 60 mg by mouth daily.     [provider]  gabapentin (NEURONTIN) 100 MG capsule Take 1 capsule (100 mg total) by mouth 3 (three) times daily. 02/26/19   Fulp, Cammie, MD  QUEtiapine (SEROQUEL XR) 200 MG 24 hr tablet Take 100 mg by mouth at bedtime.    [provider]  QUEtiapine (SEROQUEL) 100 MG tablet Take 150 mg by mouth at bedtime. Take 1 and 1/2     [provider]  rOPINIRole (REQUIP) 3 MG tablet Take 1 tablet (3 mg total) by mouth at bedtime for 30 days. 02/26/19 03/28/19  Antony Blackbird, MD  Spacer/Aero-Holding Chambers (AEROCHAMBER PLUS WITH MASK) inhaler Use as instructed Patient not taking: Reported on 04/13/2018 06/21/17   Muthersbaugh, Jarrett Soho, PA-C  Vitamin D, Ergocalciferol, (DRISDOL) 1.25 MG (50000 UT) CAPS capsule Take 1 capsule (50,000 Units total) by mouth every 7 (seven) days. 12/13/18   Gildardo Pounds, NP    Inpatient Medications: Scheduled Meds:  gabapentin  100 mg Oral TID   potassium chloride  40 mEq Oral Once   Continuous Infusions:  PRN Meds: acetaminophen **OR**  acetaminophen, melatonin, nitroGLYCERIN, traMADol  Allergies:    Allergies  Allergen Reactions   Thimerosal (Thiomersal) Swelling    In eyes, due to thimerosal in contact solution    Sulfa Antibiotics Other (See Comments)    Doesn't remember     Social History:   Social History   Socioeconomic History   Marital status: Legally Separated    Spouse name: Not on file   Number of children: Not on file   Years of education: Not on file   Highest education level: Not on file  Occupational History   Not on file  Tobacco Use   Smoking status: Every Day    Packs/day: 0.34    Years: 17.00    Total pack years: 5.78    Types: Cigarettes   Smokeless tobacco: Former    Quit date: 03/07/2015  Vaping Use   Vaping Use: Never used  Substance and Sexual Activity   Alcohol use: Yes    Alcohol/week: 0.0 standard drinks of alcohol    Comment: 2 glass wine daily   Drug use: No   Sexual activity: Not Currently  Other Topics Concern   Not on file  Social History Narrative   Not on file   Social Determinants of Health   Financial Resource Strain: Not on file  Food Insecurity: Not on file  Transportation Needs: Not on file  Physical Activity: Not on file  Stress: Not on file  Social Connections: Not on file  Intimate Partner Violence: Not on file    Family History:    Family History  Problem Relation Age of Onset   Stroke Mother    Heart disease Father    Hyperlipidemia Brother    Diabetes Maternal Grandmother      ROS:  Please see the history of present illness.   All other ROS reviewed and negative.     Physical Exam/Data:   Vitals:   02/17/23 0130 02/17/23 0200 02/17/23 0300 02/17/23 0315  BP: 109/67 114/72 134/84   Pulse: 75 73 76 71  Resp: '19 18  13  '$ Temp:      TempSrc:      SpO2: 93% 94% 95% 94%  Weight:      Height:       No intake or output data in the 24 hours ending 02/17/23 0331    02/17/2023   12:16 AM 02/26/2019    3:20 PM 12/11/2018    3:23 PM   Last 3 Weights  Weight (lbs) 155 lb 156 lb 154 lb  Weight (kg) 70.308 kg 70.761 kg 69.854 kg     Body mass index is 22.89 kg/m.  General:  Well nourished, well developed, in no acute distress HEENT: normal Neck: no JVD Vascular: No carotid bruits; Distal pulses 2+ bilaterally Cardiac:  normal S1, S2; RRR; no murmur  Lungs:  clear  to auscultation bilaterally, no wheezing, rhonchi or rales  Abd: soft, nontender, no hepatomegaly  Ext: no edema Musculoskeletal:  No deformities, BUE and BLE strength normal and equal Skin: warm and dry  Neuro:  CNs 2-12 intact, no focal abnormalities noted Psych:  Normal affect   EKG:  The EKG was personally reviewed and demonstrates:  NSR, ischemic ST changes Telemetry:  Telemetry was personally reviewed and demonstrates:    Relevant CV Studies:  Laboratory Data:  High Sensitivity Troponin:   Recent Labs  Lab 02/17/23 0028 02/17/23 0214  TROPONINIHS 19* 18*     Chemistry Recent Labs  Lab 02/17/23 0028  NA 139  K 3.7  CL 104  CO2 25  GLUCOSE 125*  BUN 16  CREATININE 0.83  CALCIUM 9.2  GFRNONAA >60  ANIONGAP 10    No results for input(s): "PROT", "ALBUMIN", "AST", "ALT", "ALKPHOS", "BILITOT" in the last 168 hours. Lipids No results for input(s): "CHOL", "TRIG", "HDL", "LABVLDL", "LDLCALC", "CHOLHDL" in the last 168 hours.  Hematology Recent Labs  Lab 02/17/23 0028  WBC 6.3  RBC 3.74*  HGB 12.1  HCT 35.2*  MCV 94.1  MCH 32.4  MCHC 34.4  RDW 12.8  PLT 262   Thyroid No results for input(s): "TSH", "FREET4" in the last 168 hours.  BNPNo results for input(s): "BNP", "PROBNP" in the last 168 hours.  DDimer No results for input(s): "DDIMER" in the last 168 hours.   Radiology/Studies:  DG Chest 2 View  Result Date: 02/17/2023 CLINICAL DATA:  Intermittent chest pain. EXAM: CHEST - 2 VIEW COMPARISON:  April 13, 2018 FINDINGS: The heart size and mediastinal contours are within normal limits. The lungs are hyperinflated. Both  lungs are clear. Moderate severity dextroscoliosis of the mid and lower thoracic spine is seen. IMPRESSION: No active cardiopulmonary disease. Electronically Signed   By: Virgina Norfolk M.D.   On: 02/17/2023 01:22     Assessment and Plan:   Cynthia Best is a 67 y.o. female with a hx of smoking, FHx of CAD, HLD, GERD, ADHD, cervical radiculopathy admitted with NSTEMI. Currently chest pain free. Ischemic ST changes are improved on repeat ECG. Will keep patient NPO for [facility] tomorrow.  NSTEMI Active smoker Family history of CAD Hyperlipidemia ADHD GERD  Plan: - Loaded with ASA prior to arrival. Continue ASA 81 mg daily - Heparin drip per ACS protocol - Lopressor 25 mg BID - Sublingual NTG PRN - HbA1c, Lipid panel, Lp(a) - Atorvastatin 80 mg daily - Keep patient NPO - LHC in am - Echocardiogram in am   Risk Assessment/Risk Scores:     TIMI Risk Score for Unstable Angina or Non-ST Elevation MI:   The patient's TIMI risk score is 4% which indicates a 20% risk of all cause mortality, new or recurrent myocardial infarction or need for urgent revascularization in the next 14 days.          For questions or updates, please contact [place] Please consult [email or web address] for contact info under    Signed, Ewing Schlein, MD  02/17/2023 3:31 AM  Patient seen and examined. Agree with assessment and plan.  Patient is currently pain-free in the emergency room.  No shortness of breath.  Her chest pain yesterday described as substernal chest tightness and pressure rating to her back worrisome for angina.  She has a strong family history for CAD and 16-year history of tobacco use, started smoking at age 68.  No JVD.  Lungs clear.  Rhythm  regular without ectopy.  No significant murmur.  Abdomen soft nontender.  2+ right radial artery pulse.  No edema.  ECG is unremarkable and troponins are negative.  I discussed the catheterization procedure in detail with the patient. I  have reviewed the risks, indications, and alternatives to cardiac catheterization, possible angioplasty, and stenting with the patient. Risks include but are not limited to bleeding, infection, vascular injury, stroke, myocardial infection, arrhythmia, kidney injury, radiation-related injury in the case of prolonged fluoroscopy use, emergency cardiac surgery, and death. The patient understands the risks of serious complication is 1-2 in [id number] with diagnostic cardiac cath and 1-2% or less with angioplasty/stenting.  Plan for cardiac catheterization later this morning with Dr. Gwenlyn Found.   Troy Sine, MD, [facility] 02/17/2023 8:13 AM

## 2023-02-18 ENCOUNTER — Encounter (HOSPITAL_COMMUNITY): Payer: Self-pay | Admitting: Cardiovascular Disease

## 2023-02-18 DIAGNOSIS — R0789 Other chest pain: Secondary | ICD-10-CM | POA: Diagnosis not present

## 2023-02-18 LAB — ECHOCARDIOGRAM COMPLETE
Area-P 1/2: 2.99 cm2
Height: 69 in
MV M vel: 2 m/s
MV Peak grad: 15.9 mmHg
S' Lateral: 2.4 cm
Weight: 2480 oz

## 2023-02-18 MED ORDER — ACETAMINOPHEN 500 MG PO TABS: 500.0000 mg | ORAL_TABLET | Freq: Four times a day (QID) | ORAL | 0 refills | Status: DC | PRN

## 2023-02-18 MED ORDER — NICOTINE 14 MG/24HR TD PT24: 14.0000 mg | MEDICATED_PATCH | Freq: Every day | TRANSDERMAL | 0 refills | Status: DC | PRN

## 2023-02-18 MED ORDER — AMPHETAMINE-DEXTROAMPHETAMINE 10 MG PO TABS
30.0000 mg | ORAL_TABLET | Freq: Every day | ORAL | Status: DC
  Administered 2023-02-18: 30 mg via ORAL
  Filled 2023-02-18: qty 3

## 2023-02-18 NOTE — TOC Transition Note (Signed)
Transition of Care [facility]) - CM/SW Discharge Note   Patient Details  Name: Cynthia Best MRN: [id number] Date of Birth: August 29, 1956  [facility] CM/SW Contact:  [name], RN Phone Number: 02/18/2023, 1:11 PM   Clinical Narrative:    Patient is for dc today, has no needs.         Patient Goals and CMS Choice      Discharge Placement                         Discharge Plan and Services Additional resources added to the After Visit Summary for                                       Social Determinants of Health (SDOH) Interventions SDOH Screenings   Food Insecurity: No Food Insecurity (02/17/2023)  Housing: Low Risk  (02/17/2023)  Transportation Needs: No Transportation Needs (02/17/2023)  Utilities: Not At Risk (02/17/2023)  Depression (PHQ2-9): Low Risk  (02/26/2019)  Tobacco Use: High Risk (02/18/2023)     Readmission Risk Interventions     No data to display

## 2023-02-18 NOTE — Discharge Summary (Signed)
Physician Discharge Summary  Patient: Cynthia Best [id number] DOB: 12/22/1956   Code Status: Full Code Admit date: 02/17/2023 Discharge date: 02/18/2023 Disposition: Home, No home health services recommended PCP: [facility]  Recommendations for Outpatient Follow-up:  Follow up with PCP within 1-2 weeks Regarding general hospital follow up and preventative care Recommend discussing decreasing or change in stimulant medications and tobacco products for potential contributions to presenting symptoms.  Follow up with cardiology Regarding risk management and possible repeat echo  Discharge Diagnoses:  Principal Problem:   Atypical chest pain Active Problems:   Tobacco abuse in remission   ADD (attention deficit disorder)   Chronic diastolic CHF (congestive heart failure) (HCC)   Chronic pain syndrome  Brief Hospital Course Summary: Cynthia Best is a 67 y.o. female with medical history significant for chronic tobacco abuse, attention deficit disorder, chronic diastolic heart failure, chronic pain syndrome.  Presented to ED on 02/17/2023 with chest pain. The patient reports 1 week of intermittent sharp substernal chest pain, with bandlike radiation across the bilateral aspects of the anterior chest wall, with occasional radiation into the left arm.  She reports that these episodes are transient, self-limited, spontaneously resolving without any pharmacologic intervention, including no interval use of nitroglycerin. She has a family history that is notable for her father and brother experiencing myocardial infarctions in their early 88s.   Per chart review, most recent prior echocardiogram occurred in April 2016, it was notable for LVEF 55 to 60%, no focal motion analysis, grade 1 diastolic dysfunction, and mild mitral regurgitation.  ED Course: chest pain had resolved prior to admission.  Vital signs in the ED: Afebrile; heart rates in the [id number] to [id number];  systolic pressures in the 110s to 130s; respiratory rate 16-19, oxygen saturation 94 to 95% on room air.   Labs: BMP notable for the following: Potassium 3.5, creatinine 0.83, glucose 125.  High-sensitivity troponin I noted to be 19, with repeat value 18.  CBC notable for white blood cell count 6300, hemoglobin 12.1.  EKG in ED: sinus rhythm with heart rate 71, QTc of 490, otherwise normal intervals, nonspecific T wave inversion in aVL, no evidence of ST changes clinically no evidence of ST elevation.   Imaging: 2 view chest x-ray: no evidence of acute cardiopulmonary process, including no evidence of infiltrate, edema, effusion, or pneumothorax.   While in the ED, the following were administered: ASA '325mg'$ , started on heparin gtt.  Cardiology was consulted.  She received LHC the following day with no CAD and no intervention. Echo completed was overall unremarkable/unchanged from previous.  ACS was ruled out as cause of her presenting symptoms. Thought to be related to GERD and/or esophageal spasm symptoms. Another possibility could be side effect of high doses of stimulant medications.  She was discharged on her chronic home medications. Would recommend discussing possibility of weaning some medications that can contribute.   Discharge Condition: Good, improved Recommended discharge diet: Regular healthy diet  Consultations: Cardiology   Procedures/Studies: LHC, without intervention Echo   Discharge Instructions     Discharge patient   Complete by: As directed    Discharge disposition: 01-Home or Self Care   Discharge patient date: 02/18/2023      Allergies as of 02/18/2023       Reactions   Thimerosal (thiomersal) Swelling   In eyes, due to thimerosal in contact solution    Sulfa Antibiotics Other (See Comments)   Doesn't remember  Medication List     STOP taking these medications    aerochamber plus with mask inhaler   albuterol 108 (90 Base) MCG/ACT  inhaler Commonly known as: VENTOLIN HFA       TAKE these medications    acetaminophen 500 MG tablet Commonly known as: TYLENOL Take 1 tablet (500 mg total) by mouth every 6 (six) hours as needed for mild pain (or Fever >/= 101).   amphetamine-dextroamphetamine 30 MG tablet Commonly known as: ADDERALL Take 1 tablet by mouth 2 times daily   aspirin EC 81 MG tablet Take 81 mg by mouth daily. Swallow whole.   atorvastatin 80 MG tablet Commonly known as: LIPITOR Take 80 mg by mouth daily.   cyclobenzaprine 10 MG tablet Commonly known as: FLEXERIL Take 10 mg by mouth in the morning and at bedtime.   FLUoxetine 40 MG capsule Commonly known as: PROZAC Take 80 mg by mouth daily.   fluticasone 50 MCG/ACT nasal spray Commonly known as: FLONASE Place 1 spray into both nostrils daily as needed for allergies or rhinitis.   gabapentin 300 MG capsule Commonly known as: NEURONTIN Take 900 mg by mouth 3 (three) times daily.   levocetirizine 5 MG tablet Commonly known as: XYZAL Take 5 mg by mouth daily as needed for allergies.   nicotine 14 mg/24hr patch Commonly known as: NICODERM CQ - dosed in mg/24 hours Place 1 patch (14 mg total) onto the skin daily as needed (smoking cessation).   rOPINIRole 3 MG tablet Commonly known as: REQUIP Take 3 mg by mouth in the morning and at bedtime.   rOPINIRole 3 MG tablet Commonly known as: REQUIP Take 1 tablet (3 mg total) by mouth at bedtime for 30 days.   traMADol 50 MG tablet Commonly known as: ULTRAM Take 50 mg by mouth 3 (three) times daily as needed for severe pain.   traZODone 150 MG tablet Commonly known as: DESYREL Take 225 mg by mouth at bedtime.   valACYclovir 500 MG tablet Commonly known as: VALTREX Take 500 mg by mouth daily.   Vitamin D (Ergocalciferol) 1.25 MG (50000 UNIT) Caps capsule Commonly known as: DRISDOL Take 1 capsule (50,000 Units total) by mouth every 7 (seven) days.         Subjective   Pt  reports feeling well. She denies any symptoms present that caused her to present to the ED. Denies SOB, CP, presyncopal symptoms. She maintains high concern about what could have caused her symptoms if not her heart. We discussed other possibilities including GI, anxiety, medications.   All questions and concerns were addressed at time of discharge.  Objective  Blood pressure 107/69, pulse 71, temperature 98.5 F (36.9 C), temperature source Oral, resp. rate 18, height '5\' 9"'$  (1.753 m), weight 70.2 kg, SpO2 92 %.   General: Pt is alert, awake, not in acute distress Cardiovascular: RRR, S1/S2 +, no rubs, no gallops Respiratory: CTA bilaterally, no wheezing, no rhonchi Abdominal: Soft, NT, ND, bowel sounds + Extremities: no edema, no cyanosis Psych: mood mildly anxious  The results of significant diagnostics from this hospitalization (including imaging, microbiology, ancillary and laboratory) are listed below for reference.   Imaging studies: ECHOCARDIOGRAM COMPLETE  Result Date: 02/18/2023    ECHOCARDIOGRAM REPORT   Patient Name:   MAYLEA KAWCZYNSKI Date of Exam: 02/17/2023 Medical Rec #:  [id number]    Height:       69.0 in Accession #:    [id number]   Weight:  155.0 lb Date of Birth:  12/10/1956     BSA:          1.854 m Patient Age:    15 years     BP:           117/69 mmHg Patient Gender: F            HR:           68 bpm. Exam Location:  Inpatient Procedure: 2D Echo, 3D Echo, Color Doppler and Cardiac Doppler Indications:    Chest Pain R07.9  History:        Patient has no prior history of Echocardiogram examinations.                 CHF, Prior CABG, Signs/Symptoms:Chest Pain; Risk                 Factors:Dyslipidemia.  Sonographer:    Greer Pickerel Referring Phys: Rhetta Mura  Sonographer Comments: Image acquisition challenging due to respiratory motion and Image acquisition challenging due to breast implants. IMPRESSIONS  1. Left ventricular ejection fraction, by estimation, is 65 to 70%.  The left ventricle has hyperdynamic function. The left ventricle has no regional wall motion abnormalities. There is mild left ventricular hypertrophy of the basal-septal segment. Left ventricular diastolic parameters are consistent with Grade I diastolic dysfunction (impaired relaxation). Elevated left atrial pressure.  2. Right ventricular systolic function is normal. The right ventricular size is normal.  3. Left atrial size was moderately dilated.  4. There is a suggestion of systolic anterior motion of the anterior mitral leaflet. The mitral valve is grossly normal. No evidence of mitral valve regurgitation.  5. The aortic valve is tricuspid. There is mild calcification of the aortic valve. There is mild thickening of the aortic valve. Aortic valve regurgitation is not visualized. Aortic valve sclerosis is present, with no evidence of aortic valve stenosis.  6. The inferior vena cava is normal in size with greater than 50% respiratory variability, suggesting right atrial pressure of 3 mmHg. Comparison(s): Cannot exclude a forme fruste of hypertrophic obstructive cardiomyopathy, but similar pathophysiological changes may be seen with hypovolemia in a patient with small LV cavity and sigmoid interventricular septum. Suggest repeat echo after volume repletion and suggest provocative maneuvers (Valsalva maneuver) to better evaluate the LVOT gradient. FINDINGS  Left Ventricle: There is evidence of LV outflow tract acceleration, but the peak resting gradient is only 15 mm Hg. Image quality does not allow evaluation of the mitral valve for possible systolic anterior motion. Left ventricular ejection fraction, by  estimation, is 65 to 70%. The left ventricle has hyperdynamic function. The left ventricle has no regional wall motion abnormalities. The left ventricular internal cavity size was small. There is mild left ventricular hypertrophy of the basal-septal segment. Left ventricular diastolic parameters are  consistent with Grade I diastolic dysfunction (impaired relaxation). Elevated left atrial pressure. Right Ventricle: The right ventricular size is normal. No increase in right ventricular wall thickness. Right ventricular systolic function is normal. Left Atrium: Left atrial size was moderately dilated. Right Atrium: Right atrial size was normal in size. Pericardium: There is no evidence of pericardial effusion. Mitral Valve: There is a suggestion of systolic anterior motion of the anterior mitral leaflet. The mitral valve is grossly normal. No evidence of mitral valve regurgitation. Tricuspid Valve: The tricuspid valve is normal in structure. Tricuspid valve regurgitation is not demonstrated. Aortic Valve: The aortic valve is tricuspid. There is mild calcification of the aortic valve.  There is mild thickening of the aortic valve. Aortic valve regurgitation is not visualized. Aortic valve sclerosis is present, with no evidence of aortic valve stenosis. Pulmonic Valve: The pulmonic valve was not well visualized. Pulmonic valve regurgitation is not visualized. Aorta: The aortic root is normal in size and structure. Venous: The inferior vena cava is normal in size with greater than 50% respiratory variability, suggesting right atrial pressure of 3 mmHg. IAS/Shunts: No atrial level shunt detected by color flow Doppler.  LEFT VENTRICLE PLAX 2D LVIDd:         3.60 cm   Diastology LVIDs:         2.40 cm   LV e' medial:    5.55 cm/s LV PW:         1.20 cm   LV E/e' medial:  15.7 LV IVS:        1.10 cm   LV e' lateral:   5.00 cm/s LVOT diam:     1.90 cm   LV E/e' lateral: 17.4 LV SV:         124 LV SV Index:   67 LVOT Area:     2.84 cm                           3D Volume EF:                          3D EF:        55 %                          LV EDV:       101 ml                          LV ESV:       45 ml                          LV SV:        56 ml RIGHT VENTRICLE RV S prime:     9.68 cm/s TAPSE (M-mode): 1.5 cm LEFT  ATRIUM             Index        RIGHT ATRIUM          Index LA diam:        3.70 cm 2.00 cm/m   RA Area:     9.35 cm LA Vol (A2C):   94.7 ml 51.08 ml/m  RA Volume:   15.50 ml 8.36 ml/m LA Vol (A4C):   83.9 ml 45.26 ml/m LA Biplane Vol: 92.4 ml 49.84 ml/m  AORTIC VALVE LVOT Vmax:   189.00 cm/s LVOT Vmean:  125.500 cm/s LVOT VTI:    0.436 m  AORTA Ao Root diam: 2.70 cm MITRAL VALVE MV Area (PHT): 2.99 cm     SHUNTS MV Decel Time: 254 msec     Systemic VTI:  0.44 m MR Peak grad: 15.9 mmHg     Systemic Diam: 1.90 cm MR Vmax:      199.50 cm/s MV E velocity: 87.20 cm/s MV A velocity: 106.00 cm/s MV E/A ratio:  0.82 Mihai Croitoru MD Electronically signed by Sanda Klein MD Signature Date/Time: 02/18/2023/12:52:28 PM    Final    CARDIAC CATHETERIZATION  Result Date: 02/17/2023 Images from the original  result were not included.   The left ventricular systolic function is normal.   LV end diastolic pressure is mildly elevated.   The left ventricular ejection fraction is 55-65% by visual estimate. LEEANNA POWLEY is a 67 y.o. female  [id number] LOCATION:  FACILITY: [facility] PHYSICIAN: Quay Burow, M.D. 1956/08/27 DATE OF PROCEDURE:  02/17/2023 DATE OF DISCHARGE: CARDIAC CATHETERIZATION History obtained from chart review.BAYLYNN NARDUCCI is a 67 y.o. female with a hx of smoking, FHx of CAD, HLD, GERD, ADHD, cervical radiculopathy who is being seen 02/17/2023 for the evaluation of chest pain at the request of the ED. her enzymes were negative.  Her EKG showed no acute changes.  She was referred for diagnostic coronary angiography to rule out an ischemic etiology.  PROCEDURE DESCRIPTION: The patient was brought to the second floor [facility] cath lab in the postabsorptive state.  She was premedicated with IV Versed and fentanyl.  Her right wrist and right groin Were prepped and shaved in usual sterile fashion. Xylocaine 1% was used for local anesthesia. A 6 French sheath was inserted into the right Radial artery using  standard Seldinger technique.  There was resistance with advancing the diagnostic catheter.  A angiogram was performed that showed a radial loop and I therefore switched to femoral approach.  A 5 French sheath was inserted into the right common femoral artery using standard Seldinger technique.  5 French right left second sinus the catheters as well as a 3 DRC catheter used for selective coronary angiography and left ventriculography respectively.  Isovue dye is used for the entirety of the case (70 cc of contrast total patient).  Retrograde ordered, ventricular and pullback pressures were recorded.  LVEDP was measured at 25.  The patient did receive 3500 units of IV heparin because of her radial sheath.   Ms. Dicus had normal coronary arteries and normal LV function.  She did have a right radial loop precluding radial cath.  I did place a Mynx closure device in her right common femoral artery achieving hemostasis.  The patient left lab in stable condition.  Her chest pain was most likely noncardiac.  I suspect that she will be stable for discharge home later today.  I have communicated this to her attending cardiologist, Dr. Ellouise Newer. Quay Burow. MD, [facility] 02/17/2023 10:46 AM    DG Chest 2 View  Result Date: 02/17/2023 CLINICAL DATA:  Intermittent chest pain. EXAM: CHEST - 2 VIEW COMPARISON:  April 13, 2018 FINDINGS: The heart size and mediastinal contours are within normal limits. The lungs are hyperinflated. Both lungs are clear. Moderate severity dextroscoliosis of the mid and lower thoracic spine is seen. IMPRESSION: No active cardiopulmonary disease. Electronically Signed   By: Virgina Norfolk M.D.   On: 02/17/2023 01:22    Labs: Basic Metabolic Panel: Recent Labs  Lab 02/17/23 0028 02/17/23 0550  NA 139 140  K 3.7 4.1  CL 104 104  CO2 25 27  GLUCOSE 125* 110*  BUN 16 18  CREATININE 0.83 0.84  CALCIUM 9.2 9.2  MG  --  1.9   CBC: Recent Labs  Lab 02/17/23 0028 02/17/23 0550  WBC  6.3 5.6  NEUTROABS  --  2.5  HGB 12.1 11.9*  HCT 35.2* 34.6*  MCV 94.1 95.1  PLT 262 258   Microbiology: Results for orders placed or performed during the hospital encounter of 03/07/15  Culture, blood (routine x 2)     Status: None   Collection Time: 03/07/15  9:43 AM   Specimen: BLOOD  Result Value Ref Range Status   Specimen Description BLOOD LEFT HAND  Final   Special Requests BOTTLES DRAWN AEROBIC AND ANAEROBIC 5ML  Final   Culture   Final    NO GROWTH 5 DAYS Performed at [facility]    Report Status 03/13/2015 FINAL  Final  Culture, blood (routine x 2)     Status: None   Collection Time: 03/07/15  9:47 AM   Specimen: BLOOD  Result Value Ref Range Status   Specimen Description BLOOD RAC  Final   Special Requests BOTTLES DRAWN AEROBIC AND ANAEROBIC 5ML  Final   Culture   Final    NO GROWTH 5 DAYS Performed at [facility]    Report Status 03/13/2015 FINAL  Final  Culture, sputum-assessment     Status: None   Collection Time: 03/07/15  4:39 PM   Specimen: Sputum  Result Value Ref Range Status   Specimen Description SPUTUM  Final   Special Requests NONE  Final   Sputum evaluation   Final    THIS SPECIMEN IS ACCEPTABLE. RESPIRATORY CULTURE REPORT TO FOLLOW.   Report Status 03/07/2015 FINAL  Final  Culture, respiratory (NON-Expectorated)     Status: None   Collection Time: 03/07/15  4:39 PM   Specimen: SPU  Result Value Ref Range Status   Specimen Description SPUTUM  Final   Special Requests NONE  Final   Gram Stain   Final    ABUNDANT WBC PRESENT,BOTH PMN AND MONONUCLEAR FEW SQUAMOUS EPITHELIAL CELLS PRESENT NO ORGANISMS SEEN Performed at [facility]    Culture   Final    NORMAL OROPHARYNGEAL FLORA Performed at [facility]    Report Status 03/10/2015 FINAL  Final   Time coordinating discharge: Over 73 minutes  Richarda Osmond, MD  [facility] 02/18/2023, 12:59 PM

## 2023-02-18 NOTE — Progress Notes (Incomplete)
PROGRESS NOTE  Cynthia Best    DOB: [birth year], 67 y.o.  [id number]    Code Status: Full Code   DOA: 02/17/2023   LOS: 0   Brief hospital course  Cynthia Best is a 67 y.o. female with a PMH significant for ***.  They presented from *** to the ED on 02/17/2023 with *** x *** days. ***  In the ED, it was found that they had ***.  Significant findings included ***.  They were initially treated with ***.   Patient was admitted to medicine service for further workup and management of *** as outlined in detail below.  02/18/23 -***  Assessment & Plan  Principal Problem:   Atypical chest pain Active Problems:   Tobacco abuse in remission   ADD (attention deficit disorder)   Chronic diastolic CHF (congestive heart failure) (HCC)   Chronic pain syndrome  *** -   *** -   *** -   *** -   *** -   Body mass index is 22.86 kg/m.  VTE ppx: SCDs Start: 02/17/23 0255   Diet:     Diet   Diet regular Room service appropriate? Yes; Fluid consistency: Thin   Consultants: ***  Subjective 02/18/23    Pt reports ***   Objective   Vitals:   02/17/23 1445 02/17/23 1934 02/18/23 0018 02/18/23 0418  BP:  107/63 117/61 107/69  Pulse:  73 71 71  Resp:  '18 16 18  '$ Temp:  97.9 F (36.6 C) 98.4 F (36.9 C) 98.5 F (36.9 C)  TempSrc:  Oral Oral Oral  SpO2:  96% 92% 92%  Weight: 70.3 kg   70.2 kg  Height: '5\' 9"'$  (1.753 m)       Intake/Output Summary (Last 24 hours) at 02/18/2023 [id number] Last data filed at 02/17/2023 1701 Gross per 24 hour  Intake 55.68 ml  Output --  Net 55.68 ml   Filed Weights   02/17/23 0016 02/17/23 1445 02/18/23 0418  Weight: 70.3 kg 70.3 kg 70.2 kg     Physical Exam: *** General: awake, alert, NAD HEENT: atraumatic, clear conjunctiva, anicteric sclera, MMM, hearing grossly normal Respiratory: normal respiratory effort. Cardiovascular: quick capillary refill, normal S1/S2, RRR, no JVD, murmurs Gastrointestinal: soft, NT, ND Nervous: A&O  x3. no gross focal neurologic deficits, normal speech Extremities: moves all equally, no edema, normal tone Skin: dry, intact, normal temperature, normal color. No rashes, lesions or ulcers on exposed skin Psychiatry: normal mood, congruent affect  Labs   I have personally reviewed the following labs and imaging studies CBC    Component Value Date/Time   WBC 5.6 02/17/2023 0550   RBC 3.64 (L) 02/17/2023 0550   HGB 11.9 (L) 02/17/2023 0550   HGB 13.8 12/11/2018 1709   HCT 34.6 (L) 02/17/2023 0550   HCT 40.6 12/11/2018 1709   PLT 258 02/17/2023 0550   PLT 348 12/11/2018 1709   MCV 95.1 02/17/2023 0550   MCV 92 12/11/2018 1709   MCH 32.7 02/17/2023 0550   MCHC 34.4 02/17/2023 0550   RDW 13.0 02/17/2023 0550   RDW 12.5 12/11/2018 1709   LYMPHSABS 2.5 02/17/2023 0550   MONOABS 0.5 02/17/2023 0550   EOSABS 0.1 02/17/2023 0550   BASOSABS 0.0 02/17/2023 0550      Latest Ref Rng & Units 02/17/2023    5:50 AM 02/17/2023   12:28 AM 12/11/2018    5:09 PM  BMP  Glucose 70 - 99 mg/dL 110  125  82  BUN 8 - 23 mg/dL '18  16  10   '$ Creatinine 0.44 - 1.00 mg/dL 0.84  0.83  0.62   BUN/Creat Ratio 12 - 28   16   Sodium 135 - 145 mmol/L 140  139  142   Potassium 3.5 - 5.1 mmol/L 4.1  3.7  4.2   Chloride 98 - 111 mmol/L 104  104  101   CO2 22 - 32 mmol/L '27  25  21   '$ Calcium 8.9 - 10.3 mg/dL 9.2  9.2  9.6     CARDIAC CATHETERIZATION  Result Date: 02/17/2023 Images from the original result were not included.   The left ventricular systolic function is normal.   LV end diastolic pressure is mildly elevated.   The left ventricular ejection fraction is 55-65% by visual estimate. Cynthia Best is a 67 y.o. female  [id number] LOCATION:  FACILITY: [facility] PHYSICIAN: Quay Burow, M.D. 08-09-1956 DATE OF PROCEDURE:  02/17/2023 DATE OF DISCHARGE: CARDIAC CATHETERIZATION History obtained from chart review.Cynthia Best is a 67 y.o. female with a hx of smoking, FHx of CAD, HLD, GERD, ADHD, cervical  radiculopathy who is being seen 02/17/2023 for the evaluation of chest pain at the request of the ED. her enzymes were negative.  Her EKG showed no acute changes.  She was referred for diagnostic coronary angiography to rule out an ischemic etiology.  PROCEDURE DESCRIPTION: The patient was brought to the second floor [facility] cath lab in the postabsorptive state.  She was premedicated with IV Versed and fentanyl.  Her right wrist and right groin Were prepped and shaved in usual sterile fashion. Xylocaine 1% was used for local anesthesia. A 6 French sheath was inserted into the right Radial artery using standard Seldinger technique.  There was resistance with advancing the diagnostic catheter.  A angiogram was performed that showed a radial loop and I therefore switched to femoral approach.  A 5 French sheath was inserted into the right common femoral artery using standard Seldinger technique.  5 French right left second sinus the catheters as well as a 3 DRC catheter used for selective coronary angiography and left ventriculography respectively.  Isovue dye is used for the entirety of the case (70 cc of contrast total patient).  Retrograde ordered, ventricular and pullback pressures were recorded.  LVEDP was measured at 25.  The patient did receive 3500 units of IV heparin because of her radial sheath.   Ms. Phung had normal coronary arteries and normal LV function.  She did have a right radial loop precluding radial cath.  I did place a Mynx closure device in her right common femoral artery achieving hemostasis.  The patient left lab in stable condition.  Her chest pain was most likely noncardiac.  I suspect that she will be stable for discharge home later today.  I have communicated this to her attending cardiologist, Dr. Ellouise Newer. Quay Burow. MD, [facility] 02/17/2023 10:46 AM    DG Chest 2 View  Result Date: 02/17/2023 CLINICAL DATA:  Intermittent chest pain. EXAM: CHEST - 2 VIEW COMPARISON:  April 13, 2018 FINDINGS: The heart size and mediastinal contours are within normal limits. The lungs are hyperinflated. Both lungs are clear. Moderate severity dextroscoliosis of the mid and lower thoracic spine is seen. IMPRESSION: No active cardiopulmonary disease. Electronically Signed   By: Virgina Norfolk M.D.   On: 02/17/2023 01:22    Disposition Plan & Communication  Patient status: Observation  Admitted From: {From:23814} Planned disposition  location: {PLAN; DISPOSITION:26386} Anticipated discharge date: *** pending ***  Family Communication: ***    Author: Richarda Osmond, DO [facility] 02/18/2023, 7:22 AM   Available by Epic secure chat 7AM-7PM. If 7PM-7AM, please contact night-coverage.  [facility] contact information found on [email or web address].

## 2023-02-18 NOTE — Consult Note (Signed)
   [facility] Inpatient Consult   02/18/2023  Cynthia Best Sep 21, 1956 [id number]  [place] Organization [ACO] Patient: [employer]  Primary Care Provider:  [facility]   *Patient endorses that she is in search of a Gerontologist*  Patient screened for hospitalization with noted to assess for potential [facility] service needs for post hospital transition for care coordination.  Review of patient's electronic medical record reveals patient is post cath procedure.  Met with the patient at the bedside and introduced [facility] care services for post hospital follow up needs. We spoke at length regarding health concerns with family history of heart disease and heart attacks.  Patient waiting to get more information on results from cardiac cath. Encouraged follow up with PCP.  Patient states she may look into [facility] about their Gerontology program. Patient is delightful and gracious.  Plan:  Referral request for community care coordination: patient will look up [facility] information to help her decide on her next moves.  Patient was given an appointment card reminder and a 24 hour nurse advise line. Encouraged her to follow up with the customer services with [facility] regarding 2024 benefits.  Of note, [facility] Care Management/Population Health does not replace or interfere with any arrangements made by the Inpatient Transition of Care team.  For questions contact:   Natividad Brood, RN BSN [place]  [phone] business mobile phone Toll free office [phone]  *Cynthia Best  [phone] Fax number: [phone] [email or web address]

## 2023-02-18 NOTE — Plan of Care (Signed)

## 2023-02-18 NOTE — TOC Progression Note (Signed)
Transition of Care [facility]) - Progression Note    Patient Details  Name: Cynthia Best MRN: [id number] Date of Birth: [birth year]  Transition of Care [facility]) CM/SW Contact  [name], RN Phone Number: 02/18/2023, 12:31 PM  Clinical Narrative:    from home, [place], s/p cath yeste, cards to clear for dc.  TOC following.        Expected Discharge Plan and Services                                               Social Determinants of Health (SDOH) Interventions SDOH Screenings   Food Insecurity: No Food Insecurity (02/17/2023)  Housing: Low Risk  (02/17/2023)  Transportation Needs: No Transportation Needs (02/17/2023)  Utilities: Not At Risk (02/17/2023)  Depression (PHQ2-9): Low Risk  (02/26/2019)  Tobacco Use: High Risk (02/18/2023)    Readmission Risk Interventions     No data to display

## 2023-02-18 NOTE — Discharge Instructions (Signed)
You were evaluated for heart injury and it was found that your chest discomfort was not caused by your heart.  I recommend following up with your PCP to discuss other explanations including medication side effects and diet.  The cardiology office will also set up follow up appointment for you.

## 2023-02-18 NOTE — Significant Event (Signed)
Pt want to talk to Cardiology before she leaves. IV removed and D/c paper given.

## 2023-02-18 NOTE — Care Management Obs Status (Signed)
[place] NOTIFICATION   Patient Details  Name: Cynthia Best MRN: [id number] Date of Birth: 01/22/1956   Medicare Observation Status Notification Given:  Yes    [name], RN 02/18/2023, 10:26 AM

## 2023-02-20 LAB — LIPOPROTEIN A (LPA): Lipoprotein (a): 12.5 nmol/L (ref <75.0)

## 2023-02-24 ENCOUNTER — Other Ambulatory Visit (HOSPITAL_BASED_OUTPATIENT_CLINIC_OR_DEPARTMENT_OTHER): Payer: Self-pay

## 2023-02-24 DIAGNOSIS — Z79899 Other long term (current) drug therapy: Secondary | ICD-10-CM | POA: Diagnosis not present

## 2023-03-03 ENCOUNTER — Ambulatory Visit: Payer: Medicare HMO | Attending: Nurse Practitioner | Admitting: Nurse Practitioner

## 2023-03-03 ENCOUNTER — Encounter: Payer: Self-pay | Admitting: Nurse Practitioner

## 2023-03-03 VITALS — BP 124/78 | HR 78 | Ht 69.0 in | Wt 153.6 lb

## 2023-03-03 DIAGNOSIS — I517 Cardiomegaly: Secondary | ICD-10-CM

## 2023-03-03 DIAGNOSIS — Z720 Tobacco use: Secondary | ICD-10-CM | POA: Diagnosis not present

## 2023-03-03 DIAGNOSIS — R42 Dizziness and giddiness: Secondary | ICD-10-CM

## 2023-03-03 DIAGNOSIS — E782 Mixed hyperlipidemia: Secondary | ICD-10-CM

## 2023-03-03 DIAGNOSIS — R0789 Other chest pain: Secondary | ICD-10-CM

## 2023-03-03 MED ORDER — NICOTINE 14 MG/24HR TD PT24: 14.0000 mg | MEDICATED_PATCH | Freq: Every day | TRANSDERMAL | 2 refills | Status: AC | PRN

## 2023-03-03 NOTE — Patient Instructions (Signed)
Medication Instructions:  Your physician recommends that you continue on your current medications as directed. Please refer to the Current Medication list given to you today.   *If you need a refill on your cardiac medications before your next appointment, please call your pharmacy*   Lab Work: NONE ordered at this time of appointment   If you have labs (blood work) drawn today and your tests are completely normal, you will receive your results only by: [place] (if you have MyChart) OR A paper copy in the mail If you have any lab test that is abnormal or we need to change your treatment, we will call you to review the results.   Testing/Procedures: Your physician has requested that you have an echocardiogram. Echocardiography is a painless test that uses sound waves to create images of your heart. It provides your doctor with information about the size and shape of your heart and how well your heart's chambers and valves are working. This procedure takes approximately one hour. There are no restrictions for this procedure. Please do NOT wear cologne, perfume, aftershave, or lotions (deodorant is allowed). Please arrive 15 minutes prior to your appointment time.  Your physician has requested that you have a carotid duplex. This test is an ultrasound of the carotid arteries in your neck. It looks at blood flow through these arteries that supply the brain with blood. Allow one hour for this exam. There are no restrictions or special instructions.     Follow-Up: At [facility], you and your health needs are our priority.  As part of our continuing mission to provide you with exceptional heart care, we have created designated Provider Care Teams.  These Care Teams include your primary Cardiologist (physician) and Advanced Practice Providers (APPs -  Physician Assistants and Nurse Practitioners) who all work together to provide you with the care you need, when you need it.  We  recommend signing up for the patient portal called "MyChart".  Sign up information is provided on this After Visit Summary.  MyChart is used to connect with patients for Virtual Visits (Telemedicine).  Patients are able to view lab/test results, encounter notes, upcoming appointments, etc.  Non-urgent messages can be sent to your provider as well.   To learn more about what you can do with MyChart, go to [email or web address].    Your next appointment:   3 month(s)  Provider:   Shelva Majestic, MD  or Diona Browner, NP        Other Instructions

## 2023-03-03 NOTE — Progress Notes (Signed)
Office Visit    Patient Name: Cynthia Best Date of Encounter: 03/03/2023  Primary Care Provider:  [facility] Primary Cardiologist:  Shelva Majestic, MD  Chief Complaint    67 year old female with a history of atypical chest pain, LVH, hyperlipidemia, ADHD, cervical radiculopathy, tobacco use, and GERD who presents for hospital follow-up related to chest pain.  Past Medical History    Past Medical History:  Diagnosis Date   Arthritis    Depression    Herpes    HLD (hyperlipidemia)    Scoliosis    Spondylolysis of cervical region    Past Surgical History:  Procedure Laterality Date   ABDOMINAL HYSTERECTOMY  01/14/2006   APPENDECTOMY  01/14/2005   LEFT HEART CATH AND CORONARY ANGIOGRAPHY N/A 02/17/2023   Procedure: LEFT HEART CATH AND CORONARY ANGIOGRAPHY;  Surgeon: Lorretta Harp, MD;  Location: [place] [facility];  Service: Cardiovascular;  Laterality: N/A;   Myoectomy     PLACEMENT OF BREAST IMPLANTS     VAGINAL PROLAPSE REPAIR  04/14/2006    Allergies  Allergies  Allergen Reactions   Thimerosal (Thiomersal) Swelling    In eyes, due to thimerosal in contact solution    Sulfa Antibiotics Other (See Comments)    Doesn't remember      Labs/Other Studies Reviewed    The following studies were reviewed today: [facility] 2023-03-06:    The left ventricular systolic function is normal.   LV end diastolic pressure is mildly elevated.   The left ventricular ejection fraction is 55-65% by visual estimate.   IMPRESSION: [name] had normal coronary arteries and normal LV function.  She did have a right radial loop precluding radial cath.  I did place a Mynx closure device in her right common femoral artery achieving hemostasis.  The patient left lab in stable condition.  Her chest pain was most likely noncardiac.  I suspect that she will be stable for discharge home later today.  I have communicated this to her attending cardiologist, Dr. Ellouise Newer.    Echo 2023/03/06: IMPRESSIONS   1. Left ventricular ejection fraction, by estimation, is 65 to 70%. The  left ventricle has hyperdynamic function. The left ventricle has no  regional wall motion abnormalities. There is mild left ventricular  hypertrophy of the basal-septal segment. Left  ventricular diastolic parameters are consistent with Grade I diastolic  dysfunction (impaired relaxation). Elevated left atrial pressure.   2. Right ventricular systolic function is normal. The right ventricular  size is normal.   3. Left atrial size was moderately dilated.   4. There is a suggestion of systolic anterior motion of the anterior  mitral leaflet. The mitral valve is grossly normal. No evidence of mitral  valve regurgitation.   5. The aortic valve is tricuspid. There is mild calcification of the  aortic valve. There is mild thickening of the aortic valve. Aortic valve  regurgitation is not visualized. Aortic valve sclerosis is present, with  no evidence of aortic valve stenosis.   6. The inferior vena cava is normal in size with greater than 50%  respiratory variability, suggesting right atrial pressure of 3 mmHg.   Comparison(s): Cannot exclude a forme fruste of hypertrophic obstructive  cardiomyopathy, but similar pathophysiological changes may be seen with  hypovolemia in a patient with small LV cavity and sigmoid interventricular  septum. Suggest repeat echo after volume repletion and suggest provocative maneuvers (Valsalva maneuver) to better evaluate the LVOT gradient.   Recent Labs: 02/17/2023:  ALT 39; B Natriuretic Peptide 70.7; BUN 18; Creatinine, Ser 0.84; Hemoglobin 11.9; Magnesium 1.9; Platelets 258; Potassium 4.1; Sodium 140  Recent Lipid Panel    Component Value Date/Time   CHOL 247 (H) 12/11/2018 1709   TRIG 149 12/11/2018 1709   HDL 64 12/11/2018 1709   CHOLHDL 3.9 12/11/2018 1709   CHOLHDL 3.0 01/24/2017 1520   VLDL 17 01/24/2017 1520   LDLCALC 153 (H) 12/11/2018 1709     History of Present Illness    67 year old female with the above past medical history including atypical chest pain, LVH, hyperlipidemia, ADHD, cervical radiculopathy, tobacco use, and GERD.   She has a family history of premature CAD in her father. She presented to the ED on 02/17/2023 with 1 week history of intermittent chest pain.  EKG was nonischemic, troponin was minimally elevated.  Cardiology was consulted.  She underwent cardiac catheterization on 02/17/2023 which revealed normal coronary arteries, EF 55 to 65%.  Echocardiogram showed EF 65 to 70%, hyperdynamic LV function, no RWMA, mild LVH of basal septal segment, G1 DD, normal RV systolic function, moderate LAE, aortic valve sclerosis without evidence of stenosis.  Repeat echocardiogram was recommended following volume repletion with proactive maneuvers to better evaluate LVOT gradient. Her chest discomfort was thought to be related to GERD and/or esophageal spasm, possible side effect of high-dose of stimulant medications.  She was discharged home in stable condition on 02/18/2023.  She presents today for follow-up accompanied by her friend.  Since her hospitalization she has done well from a cardiac standpoint.  She denies symptoms concerning for angina.  She does note ongoing intermittent lightheadedness that occurs when climbing stairs as well as bilateral arm tingling that occurs in conjunction with her lightheadedness.  She denies chest pain, dyspnea, pre-syncope or syncope.  She has quit smoking but notes she has been vaping.   Home Medications    Current Outpatient Medications  Medication Sig Dispense Refill   amphetamine-dextroamphetamine (ADDERALL) 30 MG tablet Take 1 tablet by mouth 2 times daily 60 tablet 0   aspirin EC 81 MG tablet Take 81 mg by mouth daily. Swallow whole.     atorvastatin (LIPITOR) 80 MG tablet Take 80 mg by mouth daily.     cyclobenzaprine (FLEXERIL) 10 MG tablet Take 10 mg by mouth in the morning and at  bedtime.     FLUoxetine (PROZAC) 40 MG capsule Take 80 mg by mouth daily.     fluticasone (FLONASE) 50 MCG/ACT nasal spray Place 1 spray into both nostrils daily as needed for allergies or rhinitis.     gabapentin (NEURONTIN) 300 MG capsule Take 900 mg by mouth 3 (three) times daily.     levocetirizine (XYZAL) 5 MG tablet Take 5 mg by mouth daily as needed for allergies.     rOPINIRole (REQUIP) 3 MG tablet Take 3 mg by mouth in the morning and at bedtime.     traMADol (ULTRAM) 50 MG tablet Take 50 mg by mouth 3 (three) times daily as needed for severe pain.     traZODone (DESYREL) 150 MG tablet Take 225 mg by mouth at bedtime.     valACYclovir (VALTREX) 500 MG tablet Take 500 mg by mouth daily.     Vitamin D, Ergocalciferol, (DRISDOL) 1.25 MG (50000 UT) CAPS capsule Take 1 capsule (50,000 Units total) by mouth every 7 (seven) days. 12 capsule 1   nicotine (NICODERM CQ - DOSED IN MG/24 HOURS) 14 mg/24hr patch Place 1 patch (14 mg total) onto the  skin daily as needed (smoking cessation). 28 patch 2   rOPINIRole (REQUIP) 3 MG tablet Take 1 tablet (3 mg total) by mouth at bedtime for 30 days. 30 tablet 2   No current facility-administered medications for this visit.     Review of Systems    She denies chest pain, palpitations, dyspnea, pnd, orthopnea, n, v, syncope, edema, weight gain, or early satiety. All other systems reviewed and are otherwise negative except as noted above.   Physical Exam    VS:  BP 124/78   Pulse 78   Ht '5\' 9"'$  (1.753 m)   Wt 153 lb 9.6 oz (69.7 kg)   SpO2 98%   BMI 22.68 kg/m  GEN: Well nourished, well developed, in no acute distress. HEENT: normal. Neck: Supple, no JVD, carotid bruits, or masses. Cardiac: RRR, no murmurs, rubs, or gallops. No clubbing, cyanosis, edema.  Radials/DP/PT 2+ and equal bilaterally.  Right radial cath site without bruising, bleeding, bruit or hematoma, right femoral cath site without bruising, bleeding, bruit, or hematoma. Respiratory:   Respirations regular and unlabored, clear to auscultation bilaterally. GI: Soft, nontender, nondistended, BS + x 4. MS: no deformity or atrophy. Skin: warm and dry, no rash. Neuro:  Strength and sensation are intact. Psych: Normal affect.  Accessory Clinical Findings    ECG personally reviewed by me today -NSR, 78 bpm- no acute changes.   Lab Results  Component Value Date   WBC 5.6 02/17/2023   HGB 11.9 (L) 02/17/2023   HCT 34.6 (L) 02/17/2023   MCV 95.1 02/17/2023   PLT 258 02/17/2023   Lab Results  Component Value Date   CREATININE 0.84 02/17/2023   BUN 18 02/17/2023   NA 140 02/17/2023   K 4.1 02/17/2023   CL 104 02/17/2023   CO2 27 02/17/2023   Lab Results  Component Value Date   ALT 39 02/17/2023   AST 29 02/17/2023   ALKPHOS 57 02/17/2023   BILITOT 0.6 02/17/2023   Lab Results  Component Value Date   CHOL 247 (H) 12/11/2018   HDL 64 12/11/2018   LDLCALC 153 (H) 12/11/2018   TRIG 149 12/11/2018   CHOLHDL 3.9 12/11/2018    Lab Results  Component Value Date   HGBA1C 5.8 (H) 03/28/2015    Assessment & Plan    1. Chest pain:  Cath on 02/17/2023 in the setting of intermittent chest pain, mildly  elevated troponin revealed normal coronary arteries, EF 55 to 65%. Stable with no anginal symptoms.  Aspirin, Lipitor.  2. Lightheadedness/dizziness: BP/EKG stable.  She denies presyncope, syncope. Recent echo as below.  Repeat echo pending as below.  Will check carotid ultrasound.  Discussed ED precautions.  Encouraged adequate hydration.  3. LVH: Recent echo showed EF 65 to 70%, hyperdynamic LV function, no RWMA, mild LVH of basal septal segment, G1 DD, normal RV systolic function, moderate LAE, aortic valve sclerosis without evidence of stenosis. Will repeat echo to reassess LVOT gradient.   4. Hyperlipidemia: No recent LDL on file. She notes she is due for a physical. Monitored and managed per PCP. Continue Lipitor.     5. Tobacco use: She has quit smoking but has  been vaping.  Full cessation advised. Provided Rx for nicotine patch today.  6. Disposition: Follow-up in 3 months.       Lenna Sciara, NP 03/03/2023, 4:13 PM

## 2023-03-05 DIAGNOSIS — F331 Major depressive disorder, recurrent, moderate: Secondary | ICD-10-CM | POA: Diagnosis not present

## 2023-03-05 DIAGNOSIS — E782 Mixed hyperlipidemia: Secondary | ICD-10-CM | POA: Diagnosis not present

## 2023-03-05 DIAGNOSIS — E559 Vitamin D deficiency, unspecified: Secondary | ICD-10-CM | POA: Diagnosis not present

## 2023-03-06 DIAGNOSIS — Z0000 Encounter for general adult medical examination without abnormal findings: Secondary | ICD-10-CM | POA: Diagnosis not present

## 2023-03-06 DIAGNOSIS — M5137 Other intervertebral disc degeneration, lumbosacral region: Secondary | ICD-10-CM | POA: Diagnosis not present

## 2023-03-06 DIAGNOSIS — G4700 Insomnia, unspecified: Secondary | ICD-10-CM | POA: Diagnosis not present

## 2023-03-06 DIAGNOSIS — R41840 Attention and concentration deficit: Secondary | ICD-10-CM | POA: Diagnosis not present

## 2023-03-06 DIAGNOSIS — E782 Mixed hyperlipidemia: Secondary | ICD-10-CM | POA: Diagnosis not present

## 2023-03-06 DIAGNOSIS — E559 Vitamin D deficiency, unspecified: Secondary | ICD-10-CM | POA: Diagnosis not present

## 2023-03-06 DIAGNOSIS — F331 Major depressive disorder, recurrent, moderate: Secondary | ICD-10-CM | POA: Diagnosis not present

## 2023-03-06 DIAGNOSIS — E119 Type 2 diabetes mellitus without complications: Secondary | ICD-10-CM | POA: Diagnosis not present

## 2023-03-06 DIAGNOSIS — K219 Gastro-esophageal reflux disease without esophagitis: Secondary | ICD-10-CM | POA: Diagnosis not present

## 2023-03-11 DIAGNOSIS — Z0130 Encounter for examination of blood pressure without abnormal findings: Secondary | ICD-10-CM | POA: Diagnosis not present

## 2023-03-11 DIAGNOSIS — Z6822 Body mass index (BMI) 22.0-22.9, adult: Secondary | ICD-10-CM | POA: Diagnosis not present

## 2023-03-11 DIAGNOSIS — M62838 Other muscle spasm: Secondary | ICD-10-CM | POA: Diagnosis not present

## 2023-03-11 DIAGNOSIS — Z79899 Other long term (current) drug therapy: Secondary | ICD-10-CM | POA: Diagnosis not present

## 2023-03-11 DIAGNOSIS — M539 Dorsopathy, unspecified: Secondary | ICD-10-CM | POA: Diagnosis not present

## 2023-03-11 DIAGNOSIS — F909 Attention-deficit hyperactivity disorder, unspecified type: Secondary | ICD-10-CM | POA: Diagnosis not present

## 2023-03-11 DIAGNOSIS — M549 Dorsalgia, unspecified: Secondary | ICD-10-CM | POA: Diagnosis not present

## 2023-03-11 DIAGNOSIS — J302 Other seasonal allergic rhinitis: Secondary | ICD-10-CM | POA: Diagnosis not present

## 2023-03-17 DIAGNOSIS — Z79899 Other long term (current) drug therapy: Secondary | ICD-10-CM | POA: Diagnosis not present

## 2023-03-18 ENCOUNTER — Ambulatory Visit (HOSPITAL_COMMUNITY)
Admission: RE | Admit: 2023-03-18 | Discharge: 2023-03-18 | Disposition: A | Discharge status: Home / Self Care | Payer: Medicare HMO | Source: Ambulatory Visit | Attending: Cardiology | Admitting: Cardiology

## 2023-03-18 DIAGNOSIS — R42 Dizziness and giddiness: Secondary | ICD-10-CM | POA: Diagnosis not present

## 2023-03-18 DIAGNOSIS — I517 Cardiomegaly: Secondary | ICD-10-CM | POA: Diagnosis not present

## 2023-03-21 ENCOUNTER — Telehealth: Payer: Self-pay

## 2023-03-21 NOTE — Telephone Encounter (Signed)
Spoke with pt. Pt was notified of carotid results. Pt will continue current medication and f/u as planned.

## 2023-03-21 NOTE — Telephone Encounter (Signed)
Lmom to discuss lab results. Waiting on a return call.  

## 2023-03-21 NOTE — Telephone Encounter (Signed)
Patient is returning call to discuss lab results. 

## 2023-04-02 ENCOUNTER — Ambulatory Visit (HOSPITAL_COMMUNITY): Payer: Medicare HMO | Attending: Nurse Practitioner

## 2023-04-02 DIAGNOSIS — E782 Mixed hyperlipidemia: Secondary | ICD-10-CM | POA: Diagnosis not present

## 2023-04-02 DIAGNOSIS — F331 Major depressive disorder, recurrent, moderate: Secondary | ICD-10-CM | POA: Diagnosis not present

## 2023-04-02 DIAGNOSIS — I517 Cardiomegaly: Secondary | ICD-10-CM | POA: Insufficient documentation

## 2023-04-02 DIAGNOSIS — R42 Dizziness and giddiness: Secondary | ICD-10-CM

## 2023-04-02 DIAGNOSIS — E559 Vitamin D deficiency, unspecified: Secondary | ICD-10-CM | POA: Diagnosis not present

## 2023-04-02 LAB — ECHOCARDIOGRAM COMPLETE
AV Mean grad: 10.7 mmHg
AV Peak grad: 19.2 mmHg
Ao pk vel: 2.19 m/s
Area-P 1/2: 3.75 cm2
Est EF: >75
MV M vel: 5.11 m/s
MV Peak grad: 104.4 mmHg
S' Lateral: 2.9 cm

## 2023-04-09 DIAGNOSIS — F909 Attention-deficit hyperactivity disorder, unspecified type: Secondary | ICD-10-CM | POA: Diagnosis not present

## 2023-04-09 DIAGNOSIS — Z6822 Body mass index (BMI) 22.0-22.9, adult: Secondary | ICD-10-CM | POA: Diagnosis not present

## 2023-04-09 DIAGNOSIS — M62838 Other muscle spasm: Secondary | ICD-10-CM | POA: Diagnosis not present

## 2023-04-09 DIAGNOSIS — Z79899 Other long term (current) drug therapy: Secondary | ICD-10-CM | POA: Diagnosis not present

## 2023-04-09 DIAGNOSIS — Z0130 Encounter for examination of blood pressure without abnormal findings: Secondary | ICD-10-CM | POA: Diagnosis not present

## 2023-04-09 DIAGNOSIS — M542 Cervicalgia: Secondary | ICD-10-CM | POA: Diagnosis not present

## 2023-04-09 DIAGNOSIS — J302 Other seasonal allergic rhinitis: Secondary | ICD-10-CM | POA: Diagnosis not present

## 2023-04-09 DIAGNOSIS — M549 Dorsalgia, unspecified: Secondary | ICD-10-CM | POA: Diagnosis not present

## 2023-04-09 DIAGNOSIS — G8929 Other chronic pain: Secondary | ICD-10-CM | POA: Diagnosis not present

## 2023-04-09 DIAGNOSIS — M539 Dorsopathy, unspecified: Secondary | ICD-10-CM | POA: Diagnosis not present

## 2023-04-10 DIAGNOSIS — Z780 Asymptomatic menopausal state: Secondary | ICD-10-CM | POA: Diagnosis not present

## 2023-04-26 DIAGNOSIS — Z6822 Body mass index (BMI) 22.0-22.9, adult: Secondary | ICD-10-CM | POA: Diagnosis not present

## 2023-04-26 DIAGNOSIS — K5900 Constipation, unspecified: Secondary | ICD-10-CM | POA: Diagnosis not present

## 2023-04-26 DIAGNOSIS — R030 Elevated blood-pressure reading, without diagnosis of hypertension: Secondary | ICD-10-CM | POA: Diagnosis not present

## 2023-04-26 DIAGNOSIS — R072 Precordial pain: Secondary | ICD-10-CM | POA: Diagnosis not present

## 2023-04-26 DIAGNOSIS — Z79899 Other long term (current) drug therapy: Secondary | ICD-10-CM | POA: Diagnosis not present

## 2023-04-26 DIAGNOSIS — F419 Anxiety disorder, unspecified: Secondary | ICD-10-CM | POA: Diagnosis not present

## 2023-04-26 DIAGNOSIS — R112 Nausea with vomiting, unspecified: Secondary | ICD-10-CM | POA: Diagnosis not present

## 2023-04-30 DIAGNOSIS — M419 Scoliosis, unspecified: Secondary | ICD-10-CM | POA: Diagnosis not present

## 2023-04-30 DIAGNOSIS — D539 Nutritional anemia, unspecified: Secondary | ICD-10-CM | POA: Diagnosis not present

## 2023-04-30 DIAGNOSIS — F419 Anxiety disorder, unspecified: Secondary | ICD-10-CM | POA: Diagnosis not present

## 2023-04-30 DIAGNOSIS — R109 Unspecified abdominal pain: Secondary | ICD-10-CM | POA: Diagnosis not present

## 2023-04-30 DIAGNOSIS — Z6822 Body mass index (BMI) 22.0-22.9, adult: Secondary | ICD-10-CM | POA: Diagnosis not present

## 2023-04-30 DIAGNOSIS — K5900 Constipation, unspecified: Secondary | ICD-10-CM | POA: Diagnosis not present

## 2023-04-30 DIAGNOSIS — Z79899 Other long term (current) drug therapy: Secondary | ICD-10-CM | POA: Diagnosis not present

## 2023-05-01 DIAGNOSIS — Z79899 Other long term (current) drug therapy: Secondary | ICD-10-CM | POA: Diagnosis not present

## 2023-05-05 DIAGNOSIS — Z79899 Other long term (current) drug therapy: Secondary | ICD-10-CM | POA: Diagnosis not present

## 2023-05-06 DIAGNOSIS — M4312 Spondylolisthesis, cervical region: Secondary | ICD-10-CM | POA: Diagnosis not present

## 2023-05-06 DIAGNOSIS — M5412 Radiculopathy, cervical region: Secondary | ICD-10-CM | POA: Diagnosis not present

## 2023-05-06 DIAGNOSIS — M4802 Spinal stenosis, cervical region: Secondary | ICD-10-CM | POA: Diagnosis not present

## 2023-05-06 DIAGNOSIS — M5481 Occipital neuralgia: Secondary | ICD-10-CM | POA: Diagnosis not present

## 2023-05-16 DIAGNOSIS — R3989 Other symptoms and signs involving the genitourinary system: Secondary | ICD-10-CM | POA: Diagnosis not present

## 2023-05-16 DIAGNOSIS — Z9181 History of falling: Secondary | ICD-10-CM | POA: Diagnosis not present

## 2023-05-16 DIAGNOSIS — R8290 Unspecified abnormal findings in urine: Secondary | ICD-10-CM | POA: Diagnosis not present

## 2023-05-16 DIAGNOSIS — D500 Iron deficiency anemia secondary to blood loss (chronic): Secondary | ICD-10-CM | POA: Diagnosis not present

## 2023-05-16 DIAGNOSIS — E782 Mixed hyperlipidemia: Secondary | ICD-10-CM | POA: Diagnosis not present

## 2023-05-16 DIAGNOSIS — R112 Nausea with vomiting, unspecified: Secondary | ICD-10-CM | POA: Diagnosis not present

## 2023-05-16 DIAGNOSIS — K921 Melena: Secondary | ICD-10-CM | POA: Diagnosis not present

## 2023-05-16 DIAGNOSIS — Z6822 Body mass index (BMI) 22.0-22.9, adult: Secondary | ICD-10-CM | POA: Diagnosis not present

## 2023-05-16 DIAGNOSIS — R011 Cardiac murmur, unspecified: Secondary | ICD-10-CM | POA: Diagnosis not present

## 2023-05-17 ENCOUNTER — Other Ambulatory Visit: Payer: Self-pay

## 2023-05-17 ENCOUNTER — Observation Stay (HOSPITAL_BASED_OUTPATIENT_CLINIC_OR_DEPARTMENT_OTHER)
Admission: EM | Admit: 2023-05-17 | Discharge: 2023-05-18 | Disposition: A | Discharge status: Home / Self Care | Payer: Medicare HMO | Attending: Internal Medicine | Admitting: Internal Medicine

## 2023-05-17 ENCOUNTER — Encounter (HOSPITAL_BASED_OUTPATIENT_CLINIC_OR_DEPARTMENT_OTHER): Payer: Self-pay | Admitting: Emergency Medicine

## 2023-05-17 DIAGNOSIS — D62 Acute posthemorrhagic anemia: Secondary | ICD-10-CM

## 2023-05-17 DIAGNOSIS — K449 Diaphragmatic hernia without obstruction or gangrene: Secondary | ICD-10-CM | POA: Insufficient documentation

## 2023-05-17 DIAGNOSIS — D500 Iron deficiency anemia secondary to blood loss (chronic): Secondary | ICD-10-CM | POA: Insufficient documentation

## 2023-05-17 DIAGNOSIS — R7989 Other specified abnormal findings of blood chemistry: Secondary | ICD-10-CM | POA: Diagnosis not present

## 2023-05-17 DIAGNOSIS — Z7982 Long term (current) use of aspirin: Secondary | ICD-10-CM | POA: Diagnosis not present

## 2023-05-17 DIAGNOSIS — F988 Other specified behavioral and emotional disorders with onset usually occurring in childhood and adolescence: Secondary | ICD-10-CM | POA: Diagnosis present

## 2023-05-17 DIAGNOSIS — K922 Gastrointestinal hemorrhage, unspecified: Secondary | ICD-10-CM | POA: Diagnosis not present

## 2023-05-17 DIAGNOSIS — F17210 Nicotine dependence, cigarettes, uncomplicated: Secondary | ICD-10-CM | POA: Diagnosis not present

## 2023-05-17 DIAGNOSIS — K31819 Angiodysplasia of stomach and duodenum without bleeding: Secondary | ICD-10-CM | POA: Diagnosis not present

## 2023-05-17 DIAGNOSIS — F32A Depression, unspecified: Secondary | ICD-10-CM | POA: Diagnosis present

## 2023-05-17 DIAGNOSIS — Z79899 Other long term (current) drug therapy: Secondary | ICD-10-CM | POA: Diagnosis not present

## 2023-05-17 DIAGNOSIS — K921 Melena: Principal | ICD-10-CM

## 2023-05-17 DIAGNOSIS — I5032 Chronic diastolic (congestive) heart failure: Secondary | ICD-10-CM | POA: Diagnosis not present

## 2023-05-17 DIAGNOSIS — D649 Anemia, unspecified: Secondary | ICD-10-CM

## 2023-05-17 DIAGNOSIS — G894 Chronic pain syndrome: Secondary | ICD-10-CM | POA: Diagnosis present

## 2023-05-17 DIAGNOSIS — R079 Chest pain, unspecified: Secondary | ICD-10-CM | POA: Insufficient documentation

## 2023-05-17 DIAGNOSIS — Z789 Other specified health status: Secondary | ICD-10-CM

## 2023-05-17 LAB — VITAMIN B12: Vitamin B-12: 251 pg/mL (ref 180–914)

## 2023-05-17 LAB — IRON AND TIBC
Iron: 15 ug/dL — ABNORMAL LOW (ref 28–170)
Saturation Ratios: 3 % — ABNORMAL LOW (ref 10.4–31.8)
TIBC: 503 ug/dL — ABNORMAL HIGH (ref 250–450)
UIBC: 488 ug/dL

## 2023-05-17 LAB — CBC WITH DIFFERENTIAL/PLATELET
Abs Immature Granulocytes: 0.01 10*3/uL (ref 0.00–0.07)
Basophils Absolute: 0 10*3/uL (ref 0.0–0.1)
Basophils Relative: 0 %
Eosinophils Absolute: 0 10*3/uL (ref 0.0–0.5)
Eosinophils Relative: 1 %
HCT: 18.2 % — ABNORMAL LOW (ref 36.0–46.0)
Hemoglobin: 5.8 g/dL — CL (ref 12.0–15.0)
Immature Granulocytes: 0 %
Lymphocytes Relative: 32 %
Lymphs Abs: 1.8 10*3/uL (ref 0.7–4.0)
MCH: 27.1 pg (ref 26.0–34.0)
MCHC: 31.9 g/dL (ref 30.0–36.0)
MCV: 85 fL (ref 80.0–100.0)
Monocytes Absolute: 0.5 10*3/uL (ref 0.1–1.0)
Monocytes Relative: 9 %
Neutro Abs: 3.3 10*3/uL (ref 1.7–7.7)
Neutrophils Relative %: 58 %
Platelets: 438 10*3/uL — ABNORMAL HIGH (ref 150–400)
RBC: 2.14 MIL/uL — ABNORMAL LOW (ref 3.87–5.11)
RDW: 16.8 % — ABNORMAL HIGH (ref 11.5–15.5)
WBC: 5.7 10*3/uL (ref 4.0–10.5)
nRBC: 0 % (ref 0.0–0.2)

## 2023-05-17 LAB — BASIC METABOLIC PANEL
Anion gap: 9 (ref 5–15)
BUN: 18 mg/dL (ref 8–23)
CO2: 25 mmol/L (ref 22–32)
Calcium: 9.1 mg/dL (ref 8.9–10.3)
Chloride: 106 mmol/L (ref 98–111)
Creatinine, Ser: 0.86 mg/dL (ref 0.44–1.00)
GFR, Estimated: >60 mL/min (ref >60)
Glucose, Bld: 116 mg/dL — ABNORMAL HIGH (ref 70–99)
Potassium: 3.8 mmol/L (ref 3.5–5.1)
Sodium: 140 mmol/L (ref 135–145)

## 2023-05-17 LAB — TROPONIN I (HIGH SENSITIVITY): Troponin I (High Sensitivity): 8 ng/L (ref <18)

## 2023-05-17 LAB — FOLATE: Folate: 13.2 ng/mL (ref >5.9)

## 2023-05-17 LAB — RETICULOCYTES
Immature Retic Fract: 20.9 % — ABNORMAL HIGH (ref 2.3–15.9)
RBC.: 2.13 MIL/uL — ABNORMAL LOW (ref 3.87–5.11)
Retic Count, Absolute: 67 10*3/uL (ref 19.0–186.0)
Retic Ct Pct: 3.1 % (ref 0.4–3.1)

## 2023-05-17 LAB — FERRITIN: Ferritin: 3 ng/mL — ABNORMAL LOW (ref 11–307)

## 2023-05-17 MED ORDER — FOLIC ACID 1 MG PO TABS: 1.0000 mg | ORAL_TABLET | Freq: Every day | ORAL | Status: DC

## 2023-05-17 MED ORDER — PANTOPRAZOLE INFUSION (NEW) - SIMPLE MED
8.0000 mg/h | INTRAVENOUS | Status: DC
  Administered 2023-05-17: 8 mg/h via INTRAVENOUS
  Filled 2023-05-17 (×2): qty 100

## 2023-05-17 MED ORDER — TRAZODONE HCL 50 MG PO TABS
225.0000 mg | ORAL_TABLET | Freq: Every day | ORAL | Status: DC
  Administered 2023-05-17: 225 mg via ORAL
  Filled 2023-05-17: qty 1

## 2023-05-17 MED ORDER — PANTOPRAZOLE SODIUM 40 MG IV SOLR
INTRAVENOUS | Status: AC
  Filled 2023-05-17: qty 20

## 2023-05-17 MED ORDER — LORAZEPAM 1 MG PO TABS: 1.0000 mg | ORAL_TABLET | ORAL | Status: DC | PRN

## 2023-05-17 MED ORDER — PANTOPRAZOLE SODIUM 40 MG IV SOLR: 40.0000 mg | Freq: Two times a day (BID) | INTRAVENOUS | Status: DC

## 2023-05-17 MED ORDER — PANTOPRAZOLE 80MG IVPB - SIMPLE MED
80.0000 mg | Freq: Once | INTRAVENOUS | Status: AC
  Administered 2023-05-17: 80 mg via INTRAVENOUS
  Filled 2023-05-17: qty 100

## 2023-05-17 MED ORDER — HYDROCODONE-ACETAMINOPHEN 5-325 MG PO TABS
1.0000 | ORAL_TABLET | Freq: Once | ORAL | Status: AC
  Administered 2023-05-17: 1 via ORAL
  Filled 2023-05-17: qty 1

## 2023-05-17 MED ORDER — THIAMINE MONONITRATE 100 MG PO TABS: 100.0000 mg | ORAL_TABLET | Freq: Every day | ORAL | Status: DC

## 2023-05-17 MED ORDER — ADULT MULTIVITAMIN W/MINERALS CH: 1.0000 | ORAL_TABLET | Freq: Every day | ORAL | Status: DC

## 2023-05-17 MED ORDER — ACETAMINOPHEN 325 MG PO TABS: 650.0000 mg | ORAL_TABLET | Freq: Four times a day (QID) | ORAL | Status: DC | PRN

## 2023-05-17 MED ORDER — LORAZEPAM 2 MG/ML IJ SOLN: 1.0000 mg | INTRAMUSCULAR | Status: DC | PRN

## 2023-05-17 MED ORDER — SODIUM CHLORIDE 0.9 % IV SOLN: INTRAVENOUS | Status: AC

## 2023-05-17 MED ORDER — THIAMINE HCL 100 MG/ML IJ SOLN: 100.0000 mg | Freq: Every day | INTRAMUSCULAR | Status: DC

## 2023-05-17 MED ORDER — ACETAMINOPHEN 650 MG RE SUPP: 650.0000 mg | Freq: Four times a day (QID) | RECTAL | Status: DC | PRN

## 2023-05-17 MED ORDER — SODIUM CHLORIDE 0.9% IV SOLUTION: Freq: Once | INTRAVENOUS | Status: AC

## 2023-05-17 NOTE — ED Provider Notes (Signed)
[place] EMERGENCY DEPARTMENT AT [facility] Provider Note   CSN: [id number] Arrival date & time: 05/17/23  1747     History  No chief complaint on file.   Cynthia Best is a 67 y.o. female.  HPI     67 year old female comes in with chief complaint of low hemoglobin. Patient has chronic pain syndrome, hyperlipidemia.  She reports that she had gone to [facility] for her pain medications earlier in the month.  They told her that her hemoglobin had dropped to 8.5.  There after they had her repeat the blood and her hemoglobin of 7.5.  She did not hear back from them -but then she went to the visit today and they told her that she likely will need transfusion and sent her to the emergency room.  Patient states that she has intermittent bowel movement, but over the last months she has noted dark, tarry stools.  Patient takes baby aspirin's daily since March or April.  She denies any blood thinners.  Review of systems also positive for dizziness, generalized weakness, exertional shortness of breath. Home Medications Prior to Admission medications   Medication Sig Start Date End Date Taking? Authorizing Provider  amphetamine-dextroamphetamine (ADDERALL) 30 MG tablet Take 1 tablet by mouth 2 times daily 02/14/22     aspirin EC 81 MG tablet Take 81 mg by mouth daily. Swallow whole.    [provider]  atorvastatin (LIPITOR) 80 MG tablet Take 80 mg by mouth daily.    [provider]  cyclobenzaprine (FLEXERIL) 10 MG tablet Take 10 mg by mouth in the morning and at bedtime.    [provider]  FLUoxetine (PROZAC) 40 MG capsule Take 80 mg by mouth daily.    [provider]  fluticasone (FLONASE) 50 MCG/ACT nasal spray Place 1 spray into both nostrils daily as needed for allergies or rhinitis.    [provider]  gabapentin (NEURONTIN) 300 MG capsule Take 900 mg by mouth 3 (three) times daily.    [provider]  levocetirizine  (XYZAL) 5 MG tablet Take 5 mg by mouth daily as needed for allergies.    [provider]  nicotine (NICODERM CQ - DOSED IN MG/24 HOURS) 14 mg/24hr patch Place 1 patch (14 mg total) onto the skin daily as needed (smoking cessation). 03/03/23   Joylene Grapes, NP  rOPINIRole (REQUIP) 3 MG tablet Take 1 tablet (3 mg total) by mouth at bedtime for 30 days. 02/26/19 03/28/19  Fulp, Cammie, MD  rOPINIRole (REQUIP) 3 MG tablet Take 3 mg by mouth in the morning and at bedtime.    [provider]  traMADol (ULTRAM) 50 MG tablet Take 50 mg by mouth 3 (three) times daily as needed for severe pain.    [provider]  traZODone (DESYREL) 150 MG tablet Take 225 mg by mouth at bedtime. 12/24/22   [provider]  valACYclovir (VALTREX) 500 MG tablet Take 500 mg by mouth daily.    [provider]  Vitamin D, Ergocalciferol, (DRISDOL) 1.25 MG (50000 UT) CAPS capsule Take 1 capsule (50,000 Units total) by mouth every 7 (seven) days. 12/13/18   Claiborne Rigg, NP      Allergies    Thimerosal (thiomersal) and Sulfa antibiotics    Review of Systems   Review of Systems  All other systems reviewed and are negative.   Physical Exam Updated Vital Signs BP 103/66   Pulse 90   Temp 99.1 F (37.3 C)  Resp 18   SpO2 95%  Physical Exam Vitals and nursing note reviewed.  Constitutional:      Appearance: She is well-developed.  HENT:     Head: Normocephalic and atraumatic.  Eyes:     Extraocular Movements: Extraocular movements intact.  Cardiovascular:     Rate and Rhythm: Normal rate.  Pulmonary:     Effort: Pulmonary effort is normal.  Musculoskeletal:     Cervical back: Normal range of motion and neck supple.  Skin:    General: Skin is dry.     Coloration: Skin is pale.  Neurological:     Mental Status: She is alert and oriented to person, place, and time.     ED Results / Procedures / Treatments   Labs (all labs ordered are listed, but only abnormal  results are displayed) Labs Reviewed  CBC WITH DIFFERENTIAL/PLATELET - Abnormal; Notable for the following components:      Result Value   RBC 2.14 (*)    Hemoglobin 5.8 (*)    HCT 18.2 (*)    RDW 16.8 (*)    Platelets 438 (*)    All other components within normal limits  BASIC METABOLIC PANEL - Abnormal; Notable for the following components:   Glucose, Bld 116 (*)    All other components within normal limits  RETICULOCYTES - Abnormal; Notable for the following components:   RBC. 2.13 (*)    Immature Retic Fract 20.9 (*)    All other components within normal limits  VITAMIN B12  FOLATE  IRON AND TIBC  FERRITIN    EKG None  Radiology No results found.  Procedures .Critical Care  Performed by: Derwood Kaplan, MD Authorized by: Derwood Kaplan, MD   Critical care provider statement:    Critical care time (minutes):  47   Critical care was necessary to treat or prevent imminent or life-threatening deterioration of the following conditions:  Circulatory failure   Critical care was time spent personally by me on the following activities:  Development of treatment plan with patient or surrogate, discussions with consultants, evaluation of patient's response to treatment, examination of patient, ordering and review of laboratory studies, ordering and review of radiographic studies, ordering and performing treatments and interventions, pulse oximetry, re-evaluation of patient's condition, review of old charts and obtaining history from patient or surrogate     Medications Ordered in ED Medications  pantoprazole (PROTONIX) 80 mg /NS 100 mL IVPB (80 mg Intravenous New Bag/Given 05/17/23 1914)  pantoprazole (PROTONIX) 40 MG injection (  Not Given 05/17/23 1918)    ED Course/ Medical Decision Making/ A&P Clinical Course as of 05/17/23 1921  Sat May 17, 2023  1847 Hemoglobin(!!): 5.8 Patient's hemoglobin is 5.8.  She has normal MCV with it.  Likely this is blood loss anemia. We  will have to transfer her to the hospital where she can get blood transfusion given that she is symptomatic. [AN]  1847 Basic metabolic profile is normal.  [AN]  1847 Hemoglobin(!!): 5.8 Results of the ER workup discussed with the patient.  She is made aware that she will be transferred to [facility] or [facility] hospital.  Her hemoglobin in our system was over 12 earlier this month.  Now it is less than 6.  She is having dark tarry stools, this is GI bleed and to prone otherwise. [AN]  1921 Dr. Ewing Schlein will have the GI team round on her in the morning.  N.p.o. after midnight. [AN]    Clinical Course  User Index [AN] Derwood Kaplan, MD                             Medical Decision Making Amount and/or Complexity of Data Reviewed Labs: ordered. Decision-making details documented in ED Course.  Risk Decision regarding hospitalization.   67 year old female comes in with chief complaint of abnormal lab.  She has history of chronic pain.  Patient was sent here because of anemia.  She indicates that her hemoglobin was 8.5, then 7.5.  Today the clinician at [facility] medical advised her to come to the emergency room, she likely will need transfusion.  She states that she has had intermittent stools that are dark and tarry.  It appears that patient likely has severe anemia, likely blood loss anemia.  Differential diagnosis for her includes: Variceal bleeding PUD/Gastritis/ulcers Diverticular bleed Colon cancer Rectal bleed Internal hemorrhoids External hemorrhoids  Patient sees GI with [facility].  I am unable to open records of her colonoscopy from 2022.  She does not appear to have had endoscopy in the past.  Appropriate labs have been sent.  Final Clinical Impression(s) / ED Diagnoses Final diagnoses:  Melena  Symptomatic anemia    Rx / DC Orders ED Discharge Orders     None         Derwood Kaplan, MD 05/17/23 1921

## 2023-05-17 NOTE — ED Notes (Signed)
CRITICAL VALUE STICKER  CRITICAL VALUE:Hgb 5.8  RECEIVER (on-site recipient of call):Carmon Ginsberg, RN  DATE & TIME NOTIFIED:   MESSENGER (representative from lab):  MD NOTIFIED: Dr. Rhunette Croft  TIME OF NOTIFICATION:1820  RESPONSE:

## 2023-05-17 NOTE — Progress Notes (Signed)
   Patient Name: Cynthia Best, Cynthia Best DOB: [birth year] MRN: [id number] Transferring facility: [facility] Requesting provider: nanavati, md Reason for transfer: GI bleed, symptomatic anemia 5.8 g/dl 67 yo WF with black, tarry stools intermittent x 1 month. seen at [facility] early this month. send to ER for hgb of 7.5.  repeat HgB 5.8. BUN 18, SCr 0.86. on ASA 81 mg. no systemic AC.  hx of chronic pain. [facility] aware([facility]). last BM was 2 days ago and was melanotic. started on protonix gtts. Going to: [facility] Admission Status: inpatient Bed Type: progressive To Do: make sure GI sees patient in the AM.  [facility] will assume care on arrival to accepting facility. Until arrival, medical decision making responsibilities remain with the EDP.  However, [facility] available 24/7 for questions and assistance.   Nursing staff please page [facility] Admits and Consults [phone]) as soon as the patient arrives to the hospital.  Carollee Herter, DO [facility]

## 2023-05-17 NOTE — ED Triage Notes (Addendum)
Pt presents to ED POV from [facility] medical center. Pt reports that she has been having black, tary stools x55m. Pt now feels weak and unsteady on her feet. Pt reports that her hmg was 8.4 and then a week later on 5/14 her hmg was 7.5. Sent here from [facility] for blood transfusion.

## 2023-05-17 NOTE — H&P (Signed)
History and Physical    Cynthia Best:[id number] DOB: 28-Mar-1956 DOA: 05/17/2023  PCP: [facility]  Patient coming from: [facility] ED  Chief Complaint: Black stools  HPI: Cynthia Best is a 67 y.o. female with medical history significant of chronic HFpEF, hyperlipidemia, depression, chronic pain syndrome, ADD presented to the ED with complaint of intermittent black, tarry stools x 1 month.  Seen at [facility] and sent to the ED for evaluation of worsening anemia (hemoglobin 8.5 > 7.5).  In the ED, patient's blood pressure slightly low with systolic in the 90s but not tachycardic.  Labs showing hemoglobin 5.8 with MCV 85.0 (hemoglobin was previously 11.9-12.1 on labs done in February 2024).  Anemia panel pending.  ED physician consulted [facility] (Dr. Ewing Schlein aware) and recommended n.p.o. at midnight.  Patient was given IV Protonix bolus and started on continuous infusion.  Patient reports 1 month history of intermittent black tarry stools.  States she was seen at [facility] on May 4 when her hemoglobin was 8.5.  States she had repeat labs done on May 8 and her hemoglobin had dropped to 7.5 but no one informed her.  She then went back to [facility] a few days ago and was advised to go to the ED for evaluation.  States she has endoscopy scheduled at [facility] on Wednesday but she does not wish to get it done over there.  Never had endoscopy done in the past.  She takes 2 tablets of aspirin 81 mg daily but is not on anticoagulation.  She drinks 1-2 beers every night.  Denies abdominal pain and she has not vomited blood.  Patient states she was admitted to the hospital back in February for chest pain and discharged but has continued to have episodes of exertional chest pain since then.  This happens every time she climbs steps, experiences pain in both of her arms and burning pain across her chest.  Denies any chest pain at present.   She is endorsing generalized weakness.  Patient is requesting her home trazodone 225 mg which she takes every night for insomnia.  Review of Systems:  Review of Systems  All other systems reviewed and are negative.   Past Medical History:  Diagnosis Date   Arthritis    Depression    Herpes    HLD (hyperlipidemia)    Scoliosis    Spondylolysis of cervical region     Past Surgical History:  Procedure Laterality Date   ABDOMINAL HYSTERECTOMY  01/14/2006   APPENDECTOMY  01/14/2005   LEFT HEART CATH AND CORONARY ANGIOGRAPHY N/A 02/17/2023   Procedure: LEFT HEART CATH AND CORONARY ANGIOGRAPHY;  Surgeon: Runell Gess, MD;  Location: [facility] INVASIVE CV LAB;  Service: Cardiovascular;  Laterality: N/A;   Myoectomy     PLACEMENT OF BREAST IMPLANTS     VAGINAL PROLAPSE REPAIR  04/14/2006     reports that she has been smoking cigarettes. She has a 5.78 pack-year smoking history. She quit smokeless tobacco use about 8 years ago. She reports current alcohol use. She reports that she does not use drugs.  Allergies  Allergen Reactions   Thimerosal (Thiomersal) Swelling    In eyes, due to thimerosal in contact solution    Sulfa Antibiotics Other (See Comments)    Doesn't remember     Family History  Problem Relation Age of Onset   Stroke Mother    Heart disease Father    Hyperlipidemia  Brother    Diabetes Maternal Grandmother     Prior to Admission medications   Medication Sig Start Date End Date Taking? Authorizing Provider  amphetamine-dextroamphetamine (ADDERALL) 30 MG tablet Take 1 tablet by mouth 2 times daily 02/14/22     aspirin EC 81 MG tablet Take 81 mg by mouth daily. Swallow whole.    [provider]  atorvastatin (LIPITOR) 80 MG tablet Take 80 mg by mouth daily.    [provider]  cyclobenzaprine (FLEXERIL) 10 MG tablet Take 10 mg by mouth in the morning and at bedtime.    [provider]  FLUoxetine (PROZAC) 40 MG capsule Take 80 mg by mouth  daily.    [provider]  fluticasone (FLONASE) 50 MCG/ACT nasal spray Place 1 spray into both nostrils daily as needed for allergies or rhinitis.    [provider]  gabapentin (NEURONTIN) 300 MG capsule Take 900 mg by mouth 3 (three) times daily.    [provider]  levocetirizine (XYZAL) 5 MG tablet Take 5 mg by mouth daily as needed for allergies.    [provider]  nicotine (NICODERM CQ - DOSED IN MG/24 HOURS) 14 mg/24hr patch Place 1 patch (14 mg total) onto the skin daily as needed (smoking cessation). 03/03/23   Joylene Grapes, NP  rOPINIRole (REQUIP) 3 MG tablet Take 1 tablet (3 mg total) by mouth at bedtime for 30 days. 02/26/19 03/28/19  Fulp, Cammie, MD  rOPINIRole (REQUIP) 3 MG tablet Take 3 mg by mouth in the morning and at bedtime.    [provider]  traMADol (ULTRAM) 50 MG tablet Take 50 mg by mouth 3 (three) times daily as needed for severe pain.    [provider]  traZODone (DESYREL) 150 MG tablet Take 225 mg by mouth at bedtime. 12/24/22   [provider]  valACYclovir (VALTREX) 500 MG tablet Take 500 mg by mouth daily.    [provider]  Vitamin D, Ergocalciferol, (DRISDOL) 1.25 MG (50000 UT) CAPS capsule Take 1 capsule (50,000 Units total) by mouth every 7 (seven) days. 12/13/18   Claiborne Rigg, NP    Physical Exam: Vitals:   05/17/23 1815 05/17/23 1830 05/17/23 1845 05/17/23 1900  BP: 123/69 104/68 97/67 103/66  Pulse: 81 80 87 90  Resp:      Temp:      SpO2: 100% 100% 98% 95%    Physical Exam Vitals reviewed.  Constitutional:      General: She is not in acute distress. HENT:     Head: Normocephalic and atraumatic.  Eyes:     Extraocular Movements: Extraocular movements intact.  Cardiovascular:     Rate and Rhythm: Normal rate and regular rhythm.     Pulses: Normal pulses.  Pulmonary:     Effort: No respiratory distress.     Breath sounds: Normal breath sounds. No wheezing or rales.   Abdominal:     General: Bowel sounds are normal. There is no distension.     Palpations: Abdomen is soft.     Tenderness: There is no abdominal tenderness. There is no guarding.  Musculoskeletal:     Cervical back: Normal range of motion.     Right lower leg: No edema.     Left lower leg: No edema.  Skin:    General: Skin is warm and dry.  Neurological:     General: No focal deficit present.     Mental Status: She is alert and oriented to  person, place, and time.     Labs on Admission: I have personally reviewed following labs and imaging studies  CBC: Recent Labs  Lab 05/17/23 1810  WBC 5.7  NEUTROABS 3.3  HGB 5.8*  HCT 18.2*  MCV 85.0  PLT 438*   Basic Metabolic Panel: Recent Labs  Lab 05/17/23 1810  NA 140  K 3.8  CL 106  CO2 25  GLUCOSE 116*  BUN 18  CREATININE 0.86  CALCIUM 9.1   GFR: CrCl cannot be calculated (Unknown ideal weight.). Liver Function Tests: No results for input(s): "AST", "ALT", "ALKPHOS", "BILITOT", "PROT", "ALBUMIN" in the last 168 hours. No results for input(s): "LIPASE", "AMYLASE" in the last 168 hours. No results for input(s): "AMMONIA" in the last 168 hours. Coagulation Profile: No results for input(s): "INR", "PROTIME" in the last 168 hours. Cardiac Enzymes: No results for input(s): "CKTOTAL", "CKMB", "CKMBINDEX", "TROPONINI" in the last 168 hours. BNP (last 3 results) No results for input(s): "PROBNP" in the last 8760 hours. HbA1C: No results for input(s): "HGBA1C" in the last 72 hours. CBG: No results for input(s): "GLUCAP" in the last 168 hours. Lipid Profile: No results for input(s): "CHOL", "HDL", "LDLCALC", "TRIG", "CHOLHDL", "LDLDIRECT" in the last 72 hours. Thyroid Function Tests: No results for input(s): "TSH", "T4TOTAL", "FREET4", "T3FREE", "THYROIDAB" in the last 72 hours. Anemia Panel: Recent Labs    05/17/23 1810  RETICCTPCT 3.1   Urine analysis:    Component Value Date/Time   COLORURINE YELLOW  10/30/2014 1309   APPEARANCEUR CLEAR 10/30/2014 1309   LABSPEC 1.011 10/30/2014 1309   PHURINE 5.5 10/30/2014 1309   GLUCOSEU NEGATIVE 10/30/2014 1309   HGBUR NEGATIVE 10/30/2014 1309   BILIRUBINUR negative 11/11/2017 1644   KETONESUR NEGATIVE 10/30/2014 1309   PROTEINUR negative 11/11/2017 1644   PROTEINUR NEGATIVE 10/30/2014 1309   UROBILINOGEN 0.2 11/11/2017 1644   UROBILINOGEN 0.2 10/30/2014 1309   NITRITE negative 11/11/2017 1644   NITRITE NEGATIVE 10/30/2014 1309   LEUKOCYTESUR Negative 11/11/2017 1644    Radiological Exams on Admission: No results found.  Assessment and Plan  Symptomatic acute blood loss anemia secondary to suspected upper GI bleed Patient presenting with intermittent black, tarry stools x 1 month.  No hematemesis.  Hemoglobin 5.8 and was previously 11.9-12.1 on labs done in February 2024.  Blood pressure soft but not tachycardic.  She reports daily alcohol use and takes aspirin 162 mg daily.  No prior EGD results in the chart but differentials include gastritis and PUD.  [facility] consulted.  Hold aspirin.  Keep n.p.o. after midnight, IV fluid hydration, and continue Protonix drip.  Anemia panel pending.  Type and screen, 2 units PRBCs ordered after obtaining consent from the patient.  Follow-up posttransfusion H&H and continue to transfuse if hemoglobin less than 7.  Alcohol use No signs of withdrawal at this time.  Placed on CIWA protocol; Ativan as needed.  Thiamine, folate, and multivitamin.  Exertional chest pain Admitted for this problem back in February 2024 and cardiology was consulted.  LHC was showing normal coronary arteries.  Her symptoms were thought to be related to GERD and/or esophageal spasm and possibly side effect of high doses of stimulant medications.  Patient denies active chest pain at this time.  Will check EKG and troponin.  Anemia likely contributing.  Chronic HFpEF Echo done in April 2024 showing preserved EF.  No signs of volume  overload, check BNP.  Insomnia Continue home trazodone 225 mg at bedtime (patient confirmed dose).  Hyperlipidemia Depression Chronic  pain syndrome ADD Pharmacy med rec pending.  DVT prophylaxis: SCDs Code Status: Full Code (discussed with the patient) Level of care: Progressive Care Unit Admission status: It is my clinical opinion that admission to INPATIENT is reasonable and necessary because of the expectation that this patient will require hospital care that crosses at least 2 midnights to treat this condition based on the medical complexity of the problems presented.  Given the aforementioned information, the predictability of an adverse outcome is felt to be significant.   John Giovanni MD [facility]  If 7PM-7AM, please contact night-coverage [email or web address]  05/17/2023, 9:47 PM

## 2023-05-18 ENCOUNTER — Inpatient Hospital Stay (HOSPITAL_COMMUNITY): Payer: Medicare HMO

## 2023-05-18 ENCOUNTER — Encounter (HOSPITAL_COMMUNITY): Payer: Self-pay | Admitting: Internal Medicine

## 2023-05-18 ENCOUNTER — Inpatient Hospital Stay (HOSPITAL_COMMUNITY): Payer: Medicare HMO | Admitting: Certified Registered Nurse Anesthetist

## 2023-05-18 ENCOUNTER — Encounter (HOSPITAL_COMMUNITY)
Admission: EM | Disposition: A | Discharge status: Home / Self Care | Payer: Self-pay | Source: Home / Self Care | Attending: Emergency Medicine

## 2023-05-18 DIAGNOSIS — D509 Iron deficiency anemia, unspecified: Secondary | ICD-10-CM | POA: Diagnosis not present

## 2023-05-18 DIAGNOSIS — F909 Attention-deficit hyperactivity disorder, unspecified type: Secondary | ICD-10-CM | POA: Diagnosis not present

## 2023-05-18 DIAGNOSIS — D62 Acute posthemorrhagic anemia: Secondary | ICD-10-CM | POA: Diagnosis not present

## 2023-05-18 DIAGNOSIS — K922 Gastrointestinal hemorrhage, unspecified: Secondary | ICD-10-CM | POA: Diagnosis not present

## 2023-05-18 DIAGNOSIS — K31811 Angiodysplasia of stomach and duodenum with bleeding: Secondary | ICD-10-CM

## 2023-05-18 DIAGNOSIS — J449 Chronic obstructive pulmonary disease, unspecified: Secondary | ICD-10-CM | POA: Diagnosis not present

## 2023-05-18 DIAGNOSIS — F17210 Nicotine dependence, cigarettes, uncomplicated: Secondary | ICD-10-CM

## 2023-05-18 DIAGNOSIS — K921 Melena: Secondary | ICD-10-CM | POA: Diagnosis not present

## 2023-05-18 DIAGNOSIS — F17200 Nicotine dependence, unspecified, uncomplicated: Secondary | ICD-10-CM | POA: Diagnosis not present

## 2023-05-18 DIAGNOSIS — K449 Diaphragmatic hernia without obstruction or gangrene: Secondary | ICD-10-CM | POA: Diagnosis not present

## 2023-05-18 DIAGNOSIS — D500 Iron deficiency anemia secondary to blood loss (chronic): Secondary | ICD-10-CM | POA: Diagnosis not present

## 2023-05-18 DIAGNOSIS — K31819 Angiodysplasia of stomach and duodenum without bleeding: Secondary | ICD-10-CM | POA: Diagnosis not present

## 2023-05-18 HISTORY — PX: ESOPHAGOGASTRODUODENOSCOPY (EGD) WITH PROPOFOL: SHX5813

## 2023-05-18 HISTORY — PX: HOT HEMOSTASIS: SHX5433

## 2023-05-18 LAB — POCT I-STAT, CHEM 8
BUN: 12 mg/dL (ref 8–23)
Calcium, Ion: 1.21 mmol/L (ref 1.15–1.40)
Chloride: 106 mmol/L (ref 98–111)
Creatinine, Ser: 0.7 mg/dL (ref 0.44–1.00)
Glucose, Bld: 91 mg/dL (ref 70–99)
HCT: 25 % — ABNORMAL LOW (ref 36.0–46.0)
Hemoglobin: 8.5 g/dL — ABNORMAL LOW (ref 12.0–15.0)
Potassium: 3.9 mmol/L (ref 3.5–5.1)
Sodium: 141 mmol/L (ref 135–145)
TCO2: 25 mmol/L (ref 22–32)

## 2023-05-18 LAB — HEMOGLOBIN AND HEMATOCRIT, BLOOD
HCT: 25.1 % — ABNORMAL LOW (ref 36.0–46.0)
Hemoglobin: 8.3 g/dL — ABNORMAL LOW (ref 12.0–15.0)

## 2023-05-18 LAB — HIV ANTIBODY (ROUTINE TESTING W REFLEX): HIV Screen 4th Generation wRfx: NONREACTIVE

## 2023-05-18 LAB — TYPE AND SCREEN
Antibody Screen: NEGATIVE
Unit division: 0

## 2023-05-18 LAB — BPAM RBC: Blood Product Expiration Date: 202406072359

## 2023-05-18 LAB — PREPARE RBC (CROSSMATCH)

## 2023-05-18 LAB — BRAIN NATRIURETIC PEPTIDE: B Natriuretic Peptide: 252.3 pg/mL — ABNORMAL HIGH (ref 0.0–100.0)

## 2023-05-18 SURGERY — ESOPHAGOGASTRODUODENOSCOPY (EGD) WITH PROPOFOL
Anesthesia: Monitor Anesthesia Care

## 2023-05-18 MED ORDER — PROPOFOL 500 MG/50ML IV EMUL
INTRAVENOUS | Status: DC | PRN
  Administered 2023-05-18: 125 ug/kg/min via INTRAVENOUS

## 2023-05-18 MED ORDER — SODIUM CHLORIDE 0.9 % IV SOLN
250.0000 mg | Freq: Once | INTRAVENOUS | Status: AC
  Administered 2023-05-18: 250 mg via INTRAVENOUS
  Filled 2023-05-18: qty 20

## 2023-05-18 MED ORDER — PANTOPRAZOLE SODIUM 40 MG PO TBEC: 40.0000 mg | DELAYED_RELEASE_TABLET | Freq: Every day | ORAL | Status: DC

## 2023-05-18 MED ORDER — ASPIRIN 81 MG PO TBEC: 81.0000 mg | DELAYED_RELEASE_TABLET | Freq: Every day | ORAL | 12 refills | Status: AC

## 2023-05-18 MED ORDER — IOHEXOL 300 MG/ML  SOLN
100.0000 mL | Freq: Once | INTRAMUSCULAR | Status: AC | PRN
  Administered 2023-05-18: 100 mL via INTRAVENOUS

## 2023-05-18 MED ORDER — LIDOCAINE 2% (20 MG/ML) 5 ML SYRINGE
INTRAMUSCULAR | Status: DC | PRN
  Administered 2023-05-18: 70 mg via INTRAVENOUS

## 2023-05-18 MED ORDER — PANTOPRAZOLE SODIUM 40 MG PO TBEC: 40.0000 mg | DELAYED_RELEASE_TABLET | Freq: Every day | ORAL | 3 refills | Status: DC

## 2023-05-18 MED ORDER — LACTATED RINGERS IV SOLN
INTRAVENOUS | Status: AC | PRN
  Administered 2023-05-18: 20 mL/h via INTRAVENOUS

## 2023-05-18 MED ORDER — PROPOFOL 10 MG/ML IV BOLUS
INTRAVENOUS | Status: DC | PRN
  Administered 2023-05-18: 30 mg via INTRAVENOUS
  Administered 2023-05-18: 20 mg via INTRAVENOUS
  Administered 2023-05-18: 30 mg via INTRAVENOUS
  Administered 2023-05-18 (×2): 20 mg via INTRAVENOUS
  Administered 2023-05-18: 30 mg via INTRAVENOUS

## 2023-05-18 MED ORDER — FERROUS SULFATE 325 (65 FE) MG PO TBEC: 325.0000 mg | DELAYED_RELEASE_TABLET | Freq: Every day | ORAL | Status: AC

## 2023-05-18 MED ORDER — PROPOFOL 1000 MG/100ML IV EMUL
INTRAVENOUS | Status: AC
  Filled 2023-05-18: qty 100

## 2023-05-18 MED ORDER — IOHEXOL 9 MG/ML PO SOLN
ORAL | Status: AC
  Filled 2023-05-18: qty 1000

## 2023-05-18 MED ORDER — PROPOFOL 10 MG/ML IV BOLUS
INTRAVENOUS | Status: AC
  Filled 2023-05-18: qty 20

## 2023-05-18 MED ORDER — IOHEXOL 9 MG/ML PO SOLN
500.0000 mL | ORAL | Status: AC
  Administered 2023-05-18: 500 mL via ORAL

## 2023-05-18 SURGICAL SUPPLY — 15 items

## 2023-05-18 NOTE — Care Management CC44 (Signed)
Condition Code 44 Documentation Completed  Patient Details  Name: JAYME MCIRVIN MRN: [id number] Date of Birth: [birth year]   Condition Code 44 given:  Yes Patient signature on Condition Code 44 notice:  Yes Documentation of 2 MD's agreement:  Yes Code 44 added to claim:  Yes    Adrian Prows, RN 05/18/2023, 6:39 PM

## 2023-05-18 NOTE — Discharge Instructions (Signed)
Patient given list of [facility] Primary Care Physicians 

## 2023-05-18 NOTE — Assessment & Plan Note (Signed)
-   On chronic Norco.  Database reviewed.  Laxative regimen also recommended to the patient

## 2023-05-18 NOTE — Care Management Obs Status (Signed)
MEDICARE OBSERVATION STATUS NOTIFICATION   Patient Details  Name: Cynthia Best MRN: [id number] Date of Birth: 06-13-1956   Medicare Observation Status Notification Given:  Yes    Adrian Prows, RN 05/18/2023, 6:40 PM

## 2023-05-18 NOTE — Transfer of Care (Signed)
Immediate Anesthesia Transfer of Care Note  Patient: Cynthia Best  Procedure(s) Performed: ESOPHAGOGASTRODUODENOSCOPY (EGD) WITH PROPOFOL HOT HEMOSTASIS (ARGON PLASMA COAGULATION/BICAP)  Patient Location: PACU  Anesthesia Type:MAC  Level of Consciousness: drowsy and patient cooperative  Airway & Oxygen Therapy: Patient Spontanous Breathing and Patient connected to face mask oxygen  Post-op Assessment: Report given to RN and Post -op Vital signs reviewed and stable  Post vital signs: Reviewed and stable  Last Vitals:  Vitals Value Taken Time  BP 125/64 05/18/23 0950  Temp 36.1 C 05/18/23 0950  Pulse 64 05/18/23 0951  Resp 17 05/18/23 0951  SpO2 100 % 05/18/23 0951  Vitals shown include unvalidated device data.  Last Pain:  Vitals:   05/18/23 0950  TempSrc:   PainSc: 0-No pain         Complications: No notable events documented.

## 2023-05-18 NOTE — Op Note (Signed)
[facility] Patient Name: Cynthia Best Procedure Date: 05/18/2023 MRN: [id number] Attending MD: Vida Rigger , MD, [id number] Date of Birth: [birth year] CSN: [id number] Age: 67 Admit Type: Inpatient Procedure:                Upper GI endoscopy Indications:              Iron deficiency anemia secondary to chronic blood                            loss, Melena Providers:                Vida Rigger, MD, Fransisca Connors, Priscella Mann,                            Technician Referring MD:              Medicines:                Monitored Anesthesia Care Complications:            No immediate complications. Estimated Blood Loss:     Estimated blood loss was minimal. Procedure:                Pre-Anesthesia Assessment:                           - Prior to the procedure, a History and Physical                            was performed, and patient medications and                            allergies were reviewed. The patient's tolerance of                            previous anesthesia was also reviewed. The risks                            and benefits of the procedure and the sedation                            options and risks were discussed with the patient.                            All questions were answered, and informed consent                            was obtained. Prior Anticoagulants: The patient has                            taken no anticoagulant or antiplatelet agents                            except for aspirin. ASA Grade Assessment: III - A  patient with severe systemic disease. After                            reviewing the risks and benefits, the patient was                            deemed in satisfactory condition to undergo the                            procedure.                           After obtaining informed consent, the endoscope was                            passed under direct vision. Throughout the                             procedure, the patient's blood pressure, pulse, and                            oxygen saturations were monitored continuously. The                            [id number]-[id number] ([id number]) Olympus endoscope was introduced                            through the mouth, and advanced to the third part                            of duodenum. The upper GI endoscopy was                            accomplished without difficulty. The patient                            tolerated the procedure well. Scope In: Scope Out: Findings:      The larynx was normal.      A small hiatal hernia was present.      The entire examined stomach was normal.      A single small angiodysplastic lesion without bleeding was found in the       second portion of the duodenum. Fulguration to ablate the lesion to       prevent bleeding by argon plasma at 2 liters/minute and 20 watts was       successful.      The duodenal bulb, first portion of the duodenum and third portion of       the duodenum were normal.      The exam was otherwise without abnormality. Impression:               - Normal larynx.                           - Small hiatal hernia.                           -  Normal stomach.                           - A single non-bleeding angiodysplastic lesion in                            the duodenum. Treated with argon plasma coagulation                            (APC).                           - Normal duodenal bulb, first portion of the                            duodenum and third portion of the duodenum.                           - The examination was otherwise normal.                           - No specimens collected. Moderate Sedation:      Not Applicable - Patient had care per Anesthesia. Recommendation:           - Soft diet today. Will check a CT scan with                            contrast to rule out any significant mass lesion                            and as long as no significant finding probably go                             home with further outpatient workup by primary                            gastroenterology                           - Continue present medications. Reevaluate aspirin                            requirements as well as narcotic use and add                            MiraLAX to laxative regiment and consider trying                            other prescription laxatives if needed including of                            narcotics or using the antinarcotic prescription                            laxative                           -  Return to your primary GI clinic in [facility] in                            2 weeks to consider capsule endoscopy or repeat                            colonoscopy just to be sure.                           - Telephone GI clinic if symptomatic PRN. Procedure Code(s):        --- Professional ---                           [phone], Esophagogastroduodenoscopy, flexible,                            transoral; with control of bleeding, any method Diagnosis Code(s):        --- Professional ---                           K44.9, Diaphragmatic hernia without obstruction or                            gangrene                           K31.819, Angiodysplasia of stomach and duodenum                            without bleeding                           D50.0, Iron deficiency anemia secondary to blood                            loss (chronic)                           K92.1, Melena (includes Hematochezia) CPT copyright 2022 [employer] Medical Association. All rights reserved. The codes documented in this report are preliminary and upon coder review may  be revised to meet current compliance requirements. Vida Rigger, MD 05/18/2023 9:46:09 AM This report has been signed electronically. Number of Addenda: 0

## 2023-05-18 NOTE — Anesthesia Postprocedure Evaluation (Signed)
Anesthesia Post Note  Patient: CHANETTE KILL  Procedure(s) Performed: ESOPHAGOGASTRODUODENOSCOPY (EGD) WITH PROPOFOL HOT HEMOSTASIS (ARGON PLASMA COAGULATION/BICAP)     Patient location during evaluation: PACU Anesthesia Type: MAC Level of consciousness: awake and alert, patient cooperative and oriented Pain management: pain level controlled Vital Signs Assessment: post-procedure vital signs reviewed and stable Respiratory status: spontaneous breathing, nonlabored ventilation and respiratory function stable Cardiovascular status: blood pressure returned to baseline and stable Postop Assessment: no apparent nausea or vomiting Anesthetic complications: no   No notable events documented.  Last Vitals:  Vitals:   05/18/23 0908 05/18/23 0950  BP: (!) 136/56 125/64  Pulse: 69 67  Resp: 17 18  Temp:  (!) 36.1 C  SpO2: 99% 100%    Last Pain:  Vitals:   05/18/23 0950  TempSrc:   PainSc: 0-No pain                 Gilverto Dileonardo,E. Jay Haskew

## 2023-05-18 NOTE — Assessment & Plan Note (Signed)
-   On Adderall outpatient

## 2023-05-18 NOTE — Assessment & Plan Note (Signed)
-   no s/s exacerbation 

## 2023-05-18 NOTE — Consult Note (Addendum)
Reason for Consult: Iron deficiency melena Referring Physician: Hospital team  Cynthia Best is an 67 y.o. female.  HPI: Patient seen and examined and hospital computer chart and care everywhere reviewed and she has had 2 colonoscopies at least in [facility] last in 22 without significant findings and she was iron deficient years ago without an obvious explanation according to her and since they started narcotics for her back she has had increased constipation Linzess has not been helpful but she was started on aspirin after her heart catheterization in February and probably takes 2 a day and has had black stools for a month although not diarrhea she has no other GI complaints and specifically no upper tract symptoms and her family history is negative  Past Medical History:  Diagnosis Date   Arthritis    Depression    Herpes    HLD (hyperlipidemia)    Scoliosis    Spondylolysis of cervical region     Past Surgical History:  Procedure Laterality Date   ABDOMINAL HYSTERECTOMY  01/14/2006   APPENDECTOMY  01/14/2005   LEFT HEART CATH AND CORONARY ANGIOGRAPHY N/A 02/17/2023   Procedure: LEFT HEART CATH AND CORONARY ANGIOGRAPHY;  Surgeon: Runell Gess, MD;  Location: [facility];  Service: Cardiovascular;  Laterality: N/A;   Myoectomy     PLACEMENT OF BREAST IMPLANTS     VAGINAL PROLAPSE REPAIR  04/14/2006    Family History  Problem Relation Age of Onset   Stroke Mother    Heart disease Father    Hyperlipidemia Brother    Diabetes Maternal Grandmother     Social History:  reports that she has been smoking cigarettes. She has a 5.78 pack-year smoking history. She quit smokeless tobacco use about 8 years ago. She reports current alcohol use. She reports that she does not use drugs.  Allergies:  Allergies  Allergen Reactions   Thimerosal (Thiomersal) Swelling    In eyes, due to thimerosal in contact solution    Sulfa Antibiotics Other (See Comments)    Doesn't remember      Medications: I have reviewed the patient's current medications.  Results for orders placed or performed during the hospital encounter of 05/17/23 (from the past 48 hour(s))  CBC with Differential     Status: Abnormal   Collection Time: 05/17/23  6:10 PM  Result Value Ref Range   WBC 5.7 4.0 - 10.5 K/uL   RBC 2.14 (L) 3.87 - 5.11 MIL/uL   Hemoglobin 5.8 (LL) 12.0 - 15.0 g/dL    Comment: This critical result has verified and been called to SMITH,T by Corliss Skains on 05 25 2024 at 1821, and has been read back.    HCT 18.2 (L) 36.0 - 46.0 %   MCV 85.0 80.0 - 100.0 fL   MCH 27.1 26.0 - 34.0 pg   MCHC 31.9 30.0 - 36.0 g/dL   RDW 16.1 (H) 09.6 - 04.5 %   Platelets 438 (H) 150 - 400 K/uL   nRBC 0.0 0.0 - 0.2 %   Neutrophils Relative % 58 %   Neutro Abs 3.3 1.7 - 7.7 K/uL   Lymphocytes Relative 32 %   Lymphs Abs 1.8 0.7 - 4.0 K/uL   Monocytes Relative 9 %   Monocytes Absolute 0.5 0.1 - 1.0 K/uL   Eosinophils Relative 1 %   Eosinophils Absolute 0.0 0.0 - 0.5 K/uL   Basophils Relative 0 %   Basophils Absolute 0.0 0.0 - 0.1 K/uL   Immature  Granulocytes 0 %   Abs Immature Granulocytes 0.01 0.00 - 0.07 K/uL    Comment: Performed at [facility], [address] [place], [place] [address]  Basic metabolic panel     Status: Abnormal   Collection Time: 05/17/23  6:10 PM  Result Value Ref Range   Sodium 140 135 - 145 mmol/L   Potassium 3.8 3.5 - 5.1 mmol/L   Chloride 106 98 - 111 mmol/L   CO2 25 22 - 32 mmol/L   Glucose, Bld 116 (H) 70 - 99 mg/dL    Comment: Glucose reference range applies only to samples taken after fasting for at least 8 hours.   BUN 18 8 - 23 mg/dL   Creatinine, Ser 0.98 0.44 - 1.00 mg/dL   Calcium 9.1 8.9 - 11.9 mg/dL   GFR, Estimated >14 >78 mL/min    Comment: (NOTE) Calculated using the CKD-EPI Creatinine Equation (2021)    Anion gap 9 5 - 15    Comment: Performed at [facility], [address] [place], [place]  [address]  Reticulocytes     Status: Abnormal   Collection Time: 05/17/23  6:10 PM  Result Value Ref Range   Retic Ct Pct 3.1 0.4 - 3.1 %    Comment: REPEATED TO VERIFY RESULTS CONFIRMED BY MANUAL DILUTION    RBC. 2.13 (L) 3.87 - 5.11 MIL/uL   Retic Count, Absolute 67.0 19.0 - 186.0 K/uL   Immature Retic Fract 20.9 (H) 2.3 - 15.9 %    Comment: Performed at [facility], [address], [place], [place] [address]  Vitamin B12     Status: None   Collection Time: 05/17/23  9:51 PM  Result Value Ref Range   Vitamin B-12 251 180 - 914 pg/mL    Comment: (NOTE) This assay is not validated for testing neonatal or myeloproliferative syndrome specimens for Vitamin B12 levels. Performed at [facility], [address], [place], [place] [address]   Folate     Status: None   Collection Time: 05/17/23  9:51 PM  Result Value Ref Range   Folate 13.2 >5.9 ng/mL    Comment: Performed at [facility], [address], [place], [place] [address]  Iron and TIBC     Status: Abnormal   Collection Time: 05/17/23  9:51 PM  Result Value Ref Range   Iron 15 (L) 28 - 170 ug/dL   TIBC 284 (H) 132 - 440 ug/dL   Saturation Ratios 3 (L) 10.4 - 31.8 %   UIBC 488 ug/dL    Comment: Performed at [facility], [address], [place], [place] [address]  Ferritin     Status: Abnormal   Collection Time: 05/17/23  9:51 PM  Result Value Ref Range   Ferritin 3 (L) 11 - 307 ng/mL    Comment: Performed at [facility], [address] [place], [place] [address]  Type and screen [facility]     Status: None (Preliminary result)   Collection Time: 05/17/23 11:15 PM  Result Value Ref Range   ABO/RH(D) A POS    Antibody Screen NEG    Sample Expiration      05/20/2023,2359 Performed at [facility], [address], [place], [place] [address]    Unit Number [id number]    Blood Component  Type RED CELLS,LR    Unit division 00    Status of Unit ISSUED    Transfusion Status OK  TO TRANSFUSE    Crossmatch Result Compatible    Unit Number [id number]    Blood Component Type RED CELLS,LR    Unit division 00    Status of Unit ISSUED    Transfusion Status OK TO TRANSFUSE    Crossmatch Result Compatible    Unit Number [id number]    Blood Component Type RED CELLS,LR    Unit division 00    Status of Unit ALLOCATED    Transfusion Status OK TO TRANSFUSE    Crossmatch Result Compatible   Prepare RBC (crossmatch)     Status: None   Collection Time: 05/17/23 11:15 PM  Result Value Ref Range   Order Confirmation      ORDER PROCESSED BY BLOOD BANK Performed at [facility], [address], [place], [place] [address]   Troponin I (High Sensitivity)     Status: None   Collection Time: 05/17/23 11:20 PM  Result Value Ref Range   Troponin I (High Sensitivity) 8 <18 ng/L    Comment: (NOTE) Elevated high sensitivity troponin I (hsTnI) values and significant  changes across serial measurements may suggest ACS but many other  chronic and acute conditions are known to elevate hsTnI results.  Refer to the "Links" section for chest pain algorithms and additional  guidance. Performed at [facility], [address], [place], [place] [address]   Prepare RBC (crossmatch)     Status: None   Collection Time: 05/18/23  8:00 AM  Result Value Ref Range   Order Confirmation      ORDER PROCESSED BY BLOOD BANK Performed at [facility], [address] [place], [place] [address]     No results found.  Review of Systems negative except above Blood pressure 122/62, pulse 70, temperature 98.3 F (36.8 C), temperature source Oral, resp. rate 18, height 5\' 9"  (1.753 m), weight 68.2 kg, SpO2 96 %. Physical Exam vital signs stable afebrile no acute distress exam please see preassessment evaluation labs  reviewed  Assessment/Plan: Melena in a patient on aspirin a day and iron deficient Plan: The risk benefits methods of endoscopy was discussed with the patient and we will proceed today with anesthesia assistance and would consider trying to find a different pain medicine for her back or if needed in the future the antinarcotic laxative prescription medicine and further workup and plans pending endoscopic findings and I question whether she actually even needs an aspirin a day with her normal cath  [facility] E 05/18/2023, 9:05 AM

## 2023-05-18 NOTE — Assessment & Plan Note (Signed)
-   See above. -Hemoglobin stable, 8.3 g/dL at discharge

## 2023-05-18 NOTE — Assessment & Plan Note (Signed)
Resume Prozac 

## 2023-05-18 NOTE — TOC Initial Note (Signed)
Transition of Care [facility]) - Initial/Assessment Note    Patient Details  Name: Cynthia Best MRN: [id number] Date of Birth: 09/30/1956  Transition of Care [facility]) CM/SW Contact:    Adrian Prows, RN Phone Number: 05/18/2023, 6:48 PM  Clinical Narrative:                 [facility] consult for SA counseling/education; spoke w/ pt in room; pt says she is from home w. Room mates and she plans to return at d/c; pt denies IPV, food insecurity, and difficulty paying utilities; pt says she has transportation home; pt says she depends on a friend who works to transport her otherwise; pt has glasses; she does not have dentures, HA, DME, HH services or home oxygen; pt says she needs a new PCP; pt declines resources for SA counseling; she agrees to receive resources for transportation, and [facility] PCPs; pt also encouraged to contact her insurance for additional transportation resources; she verbalized understanding; pt also given list of [facility] providers; pt will contact agencies/PCP of choice; pt given copies of resources; resources placed in d/c instructions; no TOC needs.  Expected Discharge Plan: Home/Self Care Barriers to Discharge: No Barriers Identified   Patient Goals and CMS Choice Patient states their goals for this hospitalization and ongoing recovery are:: home CMS Medicare.gov Compare Post Acute Care list provided to:: Patient Choice offered to / list presented to : Patient      Expected Discharge Plan and Services   Discharge Planning Services: CM Consult Post Acute Care Choice: NA Living arrangements for the past 2 months: Single Family Home Expected Discharge Date: 05/18/23               DME Arranged: N/A DME Agency: NA       HH Arranged: NA HH Agency: NA        Prior Living Arrangements/Services Living arrangements for the past 2 months: Single Family Home Lives with:: Roommate Patient language and need for interpreter reviewed:: Yes Do you feel safe going back to the place  where you live?: Yes      Need for Family Participation in Patient Care: Yes (Comment) Care giver support system in place?: Yes (comment) Current home services:  (n/a) Criminal Activity/Legal Involvement Pertinent to Current Situation/Hospitalization: No - Comment as needed  Activities of Daily Living   ADL Screening (condition at time of admission) Patient's cognitive ability adequate to safely complete daily activities?: Yes Is the patient deaf or have difficulty hearing?: No Does the patient have difficulty seeing, even when wearing glasses/contacts?: No Does the patient have difficulty concentrating, remembering, or making decisions?: No Patient able to express need for assistance with ADLs?: Yes Does the patient have difficulty dressing or bathing?: No Independently performs ADLs?: Yes (appropriate for developmental age) Does the patient have difficulty walking or climbing stairs?: Yes Weakness of Legs: None Weakness of Arms/Hands: None  Permission Sought/Granted Permission sought to share information with : Case Manager Permission granted to share information with : Yes, Verbal Permission Granted  Share Information with NAME: Burnard Bunting, RN, CM     Permission granted to share info w Relationship: Carlos Levering (friend) [phone]     Emotional Assessment Appearance:: Appears stated age Attitude/Demeanor/Rapport: Gracious Affect (typically observed): Accepting Orientation: : Oriented to Self, Oriented to Place, Oriented to  Time, Oriented to Situation Alcohol / Substance Use: Alcohol Use Psych Involvement: No (comment)  Admission diagnosis:  Melena [K92.1] GI bleeding [K92.2] Symptomatic anemia [D64.9] GIB (gastrointestinal bleeding) [K92.2] Patient  Active Problem List   Diagnosis Date Noted   GIB (gastrointestinal bleeding) 05/18/2023   GI bleeding 05/17/2023   Acute blood loss anemia 05/17/2023   Alcohol use 05/17/2023   ADD (attention deficit disorder)  02/17/2023   Chronic diastolic CHF (congestive heart failure) (HCC) 02/17/2023   Chronic pain syndrome 02/17/2023   Actinic keratoses 01/24/2017   Vitamin D deficiency 12/19/2015   Family history of heart disease in female family member before age 64 12/15/2015   Genital herpes 12/15/2015   Nail problem 12/15/2015   Obstructive chronic bronchitis without exacerbation (HCC) 06/14/2015   Lung nodules 04/11/2015   Systolic ejection murmur 04/11/2015   S/P hysterectomy 04/04/2015   Restless leg syndrome 03/28/2015   Tobacco abuse in remission 03/07/2015   Depression 03/07/2015   Insomnia 03/07/2015   PCP:  [facility]:   [facility] [phone] [place], [place] - [address] AT [facility] OF [address] & [facility] [address] [place] [address] Phone: [phone] Fax: [phone]  [facility] [id number] [place], [place] - [address] [place] [address] Phone: [phone] Fax: [phone]  [facility] [id number] - [place], [place] - [address] [place] [address] Phone: [phone] Fax: [phone]  [id number] - [place], [place] - [address] AT CORNER OF [facility] [address] [place] [address] Phone: [phone] Fax: [phone]     Social Determinants of Health (SDOH) Social History: SDOH Screenings   Food Insecurity: No Food Insecurity (05/18/2023)  Housing: Low Risk  (05/18/2023)  Recent Concern: Housing - Medium Risk (05/17/2023)  Transportation Needs: No Transportation Needs (05/18/2023)  Recent Concern: Transportation Needs - Unmet Transportation Needs (05/17/2023)  Utilities: Not At Risk (05/18/2023)  Depression (PHQ2-9): Low Risk  (02/26/2019)  Tobacco Use: High Risk (05/18/2023)   SDOH Interventions: Food Insecurity Interventions: Inpatient TOC Housing Interventions: Inpatient  TOC Transportation Interventions: Inpatient TOC Utilities Interventions: Inpatient TOC   Readmission Risk Interventions     No data to display

## 2023-05-18 NOTE — Anesthesia Preprocedure Evaluation (Addendum)
Anesthesia Evaluation  Patient identified by MRN, date of birth, ID band Patient awake    Reviewed: Allergy & Precautions, NPO status , Patient's Chart, lab work & pertinent test results  History of Anesthesia Complications Negative for: history of anesthetic complications  Airway Mallampati: II  TM Distance: >3 FB Neck ROM: Full    Dental  (+) Chipped, Dental Advisory Given   Pulmonary COPD, Current Smoker   breath sounds clear to auscultation       Cardiovascular (-) angina  Rhythm:Regular Rate:Normal  03/2023 ECHO: EF >75%, hyperdynamic LVF, mild asymmetric septal hypertrophy, possible late SAM, Grade 1 DD, normal RVF, mild MR  01/2023 cath: normal coronary arteries and normal LV function   Neuro/Psych  PSYCHIATRIC DISORDERS (ADHD)  Depression    Scoliosis DDD: daily narcotics    GI/Hepatic Neg liver ROS,GERD  Medicated,,  Endo/Other  negative endocrine ROS    Renal/GU negative Renal ROS     Musculoskeletal   Abdominal   Peds  Hematology  (+) Blood dyscrasia (Hb 5.8, plt 438k: s/p 2u pRBC Hb 8.5), anemia   Anesthesia Other Findings   Reproductive/Obstetrics                              Anesthesia Physical Anesthesia Plan  ASA: 3  Anesthesia Plan: MAC   Post-op Pain Management: Minimal or no pain anticipated   Induction:   PONV Risk Score and Plan: 1 and Treatment may vary due to age or medical condition  Airway Management Planned: Natural Airway and Nasal Cannula  Additional Equipment: None  Intra-op Plan:   Post-operative Plan:   Informed Consent: I have reviewed the patients History and Physical, chart, labs and discussed the procedure including the risks, benefits and alternatives for the proposed anesthesia with the patient or authorized representative who has indicated his/her understanding and acceptance.     Dental advisory given  Plan Discussed with: CRNA and  Surgeon  Anesthesia Plan Comments:         Anesthesia Quick Evaluation

## 2023-05-18 NOTE — Assessment & Plan Note (Signed)
-   no s/s withdrawal

## 2023-05-18 NOTE — Assessment & Plan Note (Signed)
-   Presented with intermittent black tarry stools for approximately 1 month.  Initially evaluated at [facility] and found to have hemoglobin 5.8 g/dL.  Outpatient workup was planned but patient elected to present to the hospital for further workup. -GI consulted and patient underwent EGD on 05/18/2023.  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  She was recommended to continue on 1 baby aspirin daily.  Patient endorsed confusion as to why she was taking 2 tablets and understands recommendation for resuming back on 1 tablet once aspirin safely resumed -Patient understands recommendations to follow-up with her outpatient GI to consider capsule endoscopy or colonoscopy -Bowel regimen also recommended -Aspirin will be held for 2 weeks at discharge then resumed -Hemoglobin 5.8 g/dL on admission and improved to 8.3 g/dL after 2 units PRBC.  Iron studies also low and she underwent infusion with Ferrlecit 250 mg x 1 -CT abdomen/pelvis ordered prior to discharge per GI.

## 2023-05-18 NOTE — Discharge Summary (Signed)
Physician Discharge Summary   Cynthia Best ZOX:[id number] DOB: 12/01/1956 DOA: 05/17/2023  PCP: [facility]  Admit date: 05/17/2023 Discharge date: 05/18/2023  Admitted From: Home Disposition:  Home Discharging physician: Lewie Chamber, MD Barriers to discharge: none  Recommendations at discharge: Follow up with GI Repeat CBC  Discharge Condition: stable CODE STATUS: Full Diet recommendation:  Diet Orders (From admission, onward)     Start     Ordered   05/18/23 1031  DIET SOFT Fluid consistency: Thin  Diet effective now       Question:  Fluid consistency:  Answer:  Thin   05/18/23 1030            Hospital Course: No notes on file  Assessment and Plan: * GI bleeding - Presented with intermittent black tarry stools for approximately 1 month.  Initially evaluated at [facility] and found to have hemoglobin 5.8 g/dL.  Outpatient workup was planned but patient elected to present to the hospital for further workup. -GI consulted and patient underwent EGD on 05/18/2023.  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  She was recommended to continue on 1 baby aspirin daily.  Patient endorsed confusion as to why she was taking 2 tablets and understands recommendation for resuming back on 1 tablet once aspirin safely resumed -Patient understands recommendations to follow-up with her outpatient GI to consider capsule endoscopy or colonoscopy -Bowel regimen also recommended -Aspirin will be held for 2 weeks at discharge then resumed -Hemoglobin 5.8 g/dL on admission and improved to 8.3 g/dL after 2 units PRBC.  Iron studies also low and she underwent infusion with Ferrlecit 250 mg x 1 -CT abdomen/pelvis ordered prior to discharge per GI.  Acute blood loss anemia - See  above. -Hemoglobin stable, 8.3 g/dL at discharge  Alcohol use - no s/s withdrawal   Chronic pain syndrome - On chronic Norco.  Database reviewed.  Laxative regimen also recommended to the patient  Chronic diastolic CHF (congestive heart failure) (HCC) - no s/s exacerbation  ADD (attention deficit disorder) - On Adderall outpatient  Depression - Resume Prozac   The patient's chronic medical conditions were treated accordingly per the patient's home medication regimen except as noted.  On day of discharge, patient was felt deemed stable for discharge. Patient/family member advised to call PCP or come back to ER if needed.   Principal Diagnosis: GI bleeding  Discharge Diagnoses: Active Hospital Problems   Diagnosis Date Noted   GI bleeding 05/17/2023    Priority: 1.   Acute blood loss anemia 05/17/2023    Priority: 2.   Alcohol use 05/17/2023   Chronic diastolic CHF (congestive heart failure) (HCC) 02/17/2023   ADD (attention deficit disorder) 02/17/2023   Chronic pain syndrome 02/17/2023   Depression 03/07/2015    Resolved Hospital Problems   Diagnosis Date Noted Date Resolved   Chest pain 02/17/2023 05/18/2023     Discharge Instructions     Increase activity slowly   Complete by: As directed       Allergies as of 05/18/2023       Reactions   Thimerosal (thiomersal) Swelling   In eyes, due to thimerosal in contact solution    Sulfa Antibiotics Other (See Comments)   Doesn't remember         Medication List     TAKE  these medications    amphetamine-dextroamphetamine 30 MG tablet Commonly known as: ADDERALL Take 1 tablet by mouth 2 times daily   aspirin EC 81 MG tablet Take 1 tablet (81 mg total) by mouth daily. Swallow whole. Start taking on: June 01, 2023 What changed:  how much to take These instructions start on June 01, 2023. If you are unsure what to do until then, ask your doctor or other care provider.   atorvastatin 80 MG tablet Commonly  known as: LIPITOR Take 80 mg by mouth every evening.   cyclobenzaprine 10 MG tablet Commonly known as: FLEXERIL Take 10 mg by mouth in the morning and at bedtime.   ferrous sulfate 325 (65 FE) MG EC tablet Take 1 tablet (325 mg total) by mouth daily with breakfast.   FLUoxetine 40 MG capsule Commonly known as: PROZAC Take 80 mg by mouth every evening.   fluticasone 50 MCG/ACT nasal spray Commonly known as: FLONASE Place 1 spray into both nostrils daily as needed for allergies or rhinitis.   gabapentin 300 MG capsule Commonly known as: NEURONTIN Take 900 mg by mouth every morning.   HYDROcodone-acetaminophen 5-325 MG tablet Commonly known as: NORCO/VICODIN Take 1 tablet by mouth daily.   levocetirizine 5 MG tablet Commonly known as: XYZAL Take 5 mg by mouth every evening.   nicotine 14 mg/24hr patch Commonly known as: NICODERM CQ - dosed in mg/24 hours Place 1 patch (14 mg total) onto the skin daily as needed (smoking cessation).   ondansetron 4 MG disintegrating tablet Commonly known as: ZOFRAN-ODT Take 4 mg by mouth every 6 (six) hours as needed for nausea or vomiting.   pantoprazole 40 MG tablet Commonly known as: PROTONIX Take 1 tablet (40 mg total) by mouth daily. What changed:  medication strength how much to take when to take this   rOPINIRole 3 MG tablet Commonly known as: REQUIP Take 3 mg by mouth in the morning and at bedtime. What changed: Another medication with the same name was removed. Continue taking this medication, and follow the directions you see here.   traZODone 150 MG tablet Commonly known as: DESYREL Take 225 mg by mouth at bedtime.   valACYclovir 500 MG tablet Commonly known as: VALTREX Take 500 mg by mouth every evening.   Vitamin D (Ergocalciferol) 1.25 MG (50000 UNIT) Caps capsule Commonly known as: DRISDOL Take 1 capsule (50,000 Units total) by mouth every 7 (seven) days.        Allergies  Allergen Reactions   Thimerosal  (Thiomersal) Swelling    In eyes, due to thimerosal in contact solution    Sulfa Antibiotics Other (See Comments)    Doesn't remember     Consultations: GI  Procedures: 05/18/23: EGD  Discharge Exam: BP 115/72 (BP Location: Right Arm)   Pulse 73   Temp 98.7 F (37.1 C) (Oral)   Resp 18   Ht 5\' 9"  (1.753 m)   Wt 68.2 kg   SpO2 98%   BMI 22.20 kg/m  Physical Exam Constitutional:      Appearance: Normal appearance.  HENT:     Head: Normocephalic and atraumatic.     Mouth/Throat:     Mouth: Mucous membranes are moist.  Eyes:     Extraocular Movements: Extraocular movements intact.  Cardiovascular:     Rate and Rhythm: Normal rate and regular rhythm.  Pulmonary:     Effort: Pulmonary effort is normal.     Breath sounds: Normal breath sounds.  Abdominal:  General: Bowel sounds are normal. There is no distension.     Palpations: Abdomen is soft.     Tenderness: There is no abdominal tenderness.  Musculoskeletal:        General: Normal range of motion.     Cervical back: Normal range of motion and neck supple.  Skin:    General: Skin is warm and dry.  Neurological:     General: No focal deficit present.     Mental Status: She is alert.  Psychiatric:        Mood and Affect: Mood normal.      The results of significant diagnostics from this hospitalization (including imaging, microbiology, ancillary and laboratory) are listed below for reference.   Microbiology: No results found for this or any previous visit (from the past 240 hour(s)).   Labs: BNP (last 3 results) Recent Labs    02/17/23 0550 05/18/23 1143  BNP 70.7 252.3*   Basic Metabolic Panel: Recent Labs  Lab 05/17/23 1810 05/18/23 0909  NA 140 141  K 3.8 3.9  CL 106 106  CO2 25  --   GLUCOSE 116* 91  BUN 18 12  CREATININE 0.86 0.70  CALCIUM 9.1  --    Liver Function Tests: No results for input(s): "AST", "ALT", "ALKPHOS", "BILITOT", "PROT", "ALBUMIN" in the last 168 hours. No results  for input(s): "LIPASE", "AMYLASE" in the last 168 hours. No results for input(s): "AMMONIA" in the last 168 hours. CBC: Recent Labs  Lab 05/17/23 1810 05/18/23 0909 05/18/23 1143  WBC 5.7  --   --   NEUTROABS 3.3  --   --   HGB 5.8* 8.5* 8.3*  HCT 18.2* 25.0* 25.1*  MCV 85.0  --   --   PLT 438*  --   --    Cardiac Enzymes: No results for input(s): "CKTOTAL", "CKMB", "CKMBINDEX", "TROPONINI" in the last 168 hours. BNP: Invalid input(s): "POCBNP" CBG: No results for input(s): "GLUCAP" in the last 168 hours. D-Dimer No results for input(s): "DDIMER" in the last 72 hours. Hgb A1c No results for input(s): "HGBA1C" in the last 72 hours. Lipid Profile No results for input(s): "CHOL", "HDL", "LDLCALC", "TRIG", "CHOLHDL", "LDLDIRECT" in the last 72 hours. Thyroid function studies No results for input(s): "TSH", "T4TOTAL", "T3FREE", "THYROIDAB" in the last 72 hours.  Invalid input(s): "FREET3" Anemia work up Recent Labs    05/17/23 1810 05/17/23 2151  VITAMINB12  --  251  FOLATE  --  13.2  FERRITIN  --  3*  TIBC  --  503*  IRON  --  15*  RETICCTPCT 3.1  --    Urinalysis    Component Value Date/Time   COLORURINE YELLOW 10/30/2014 1309   APPEARANCEUR CLEAR 10/30/2014 1309   LABSPEC 1.011 10/30/2014 1309   PHURINE 5.5 10/30/2014 1309   GLUCOSEU NEGATIVE 10/30/2014 1309   HGBUR NEGATIVE 10/30/2014 1309   BILIRUBINUR negative 11/11/2017 1644   KETONESUR NEGATIVE 10/30/2014 1309   PROTEINUR negative 11/11/2017 1644   PROTEINUR NEGATIVE 10/30/2014 1309   UROBILINOGEN 0.2 11/11/2017 1644   UROBILINOGEN 0.2 10/30/2014 1309   NITRITE negative 11/11/2017 1644   NITRITE NEGATIVE 10/30/2014 1309   LEUKOCYTESUR Negative 11/11/2017 1644   Sepsis Labs Recent Labs  Lab 05/17/23 1810  WBC 5.7   Microbiology No results found for this or any previous visit (from the past 240 hour(s)).  Procedures/Studies: CT ABDOMEN PELVIS W CONTRAST  Result Date: 05/18/2023 CLINICAL DATA:   Lower GI bleed. EXAM: CT ABDOMEN AND PELVIS  WITH CONTRAST TECHNIQUE: Multidetector CT imaging of the abdomen and pelvis was performed using the standard protocol following bolus administration of intravenous contrast. RADIATION DOSE REDUCTION: This exam was performed according to the departmental dose-optimization program which includes automated exposure control, adjustment of the mA and/or kV according to patient size and/or use of iterative reconstruction technique. CONTRAST:  OMNIPAQUE IOHEXOL 300 MG/ML  SOLN COMPARISON:  November 26, 2016 FINDINGS: Lower chest: Minimal bilateral pleural effusions versus pleural thickening. Bibasilar atelectasis versus airspace consolidation. Hepatobiliary: No focal liver abnormality is seen. No gallstones, gallbladder wall thickening, or biliary dilatation. Pancreas: Unremarkable. No pancreatic ductal dilatation or surrounding inflammatory changes. Spleen: Normal in size without focal abnormality. Adrenals/Urinary Tract: Bilateral adrenal hypertrophy. Normal appearance of the kidneys and urinary bladder. Stomach/Bowel: Normal stomach and small bowel. Featureless appearance of the descending and sigmoid colon. Vascular/Lymphatic: Aortic atherosclerosis. No enlarged abdominal or pelvic lymph nodes. Reproductive: Status post hysterectomy. No adnexal masses. Other: No abdominal wall hernia or abnormality. No abdominopelvic ascites. Musculoskeletal: Mild spondylosis of the lumbosacral spine. Large Schmorl's node off of the superior endplate of L4 vertebral body. IMPRESSION: 1. Featureless appearance of the descending and sigmoid colon, which may be seen in the setting of inflammatory bowel disease or colitis. 2. Minimal bilateral pleural effusions versus pleural thickening. 3. Bibasilar atelectasis versus airspace consolidation. 4. Aortic atherosclerosis. Aortic Atherosclerosis (ICD10-I70.0). Electronically Signed   By: Ted Mcalpine M.D.   On: 05/18/2023 17:08      Time coordinating discharge: Over 30 minutes    Lewie Chamber, MD  [facility] 05/18/2023, 5:22 PM

## 2023-05-19 ENCOUNTER — Encounter (HOSPITAL_COMMUNITY): Payer: Self-pay | Admitting: Gastroenterology

## 2023-05-19 ENCOUNTER — Other Ambulatory Visit: Payer: Self-pay | Admitting: Internal Medicine

## 2023-05-19 LAB — TYPE AND SCREEN
ABO/RH(D): A POS
Unit division: 0

## 2023-05-19 LAB — BPAM RBC
Blood Product Expiration Date: 202406192359
ISSUE DATE / TIME: 202405260515
Unit Type and Rh: 6200

## 2023-05-19 MED ORDER — PANTOPRAZOLE SODIUM 40 MG PO TBEC: 40.0000 mg | DELAYED_RELEASE_TABLET | Freq: Every day | ORAL | 3 refills | Status: AC

## 2023-05-21 ENCOUNTER — Other Ambulatory Visit: Payer: Self-pay

## 2023-05-21 ENCOUNTER — Encounter (HOSPITAL_COMMUNITY): Payer: Self-pay

## 2023-05-21 ENCOUNTER — Emergency Department (HOSPITAL_COMMUNITY)
Admission: EM | Admit: 2023-05-21 | Discharge: 2023-05-21 | Disposition: A | Discharge status: Home / Self Care | Payer: Medicare HMO | Attending: Emergency Medicine | Admitting: Emergency Medicine

## 2023-05-21 DIAGNOSIS — I809 Phlebitis and thrombophlebitis of unspecified site: Secondary | ICD-10-CM | POA: Diagnosis not present

## 2023-05-21 DIAGNOSIS — Z720 Tobacco use: Secondary | ICD-10-CM | POA: Insufficient documentation

## 2023-05-21 DIAGNOSIS — Z7982 Long term (current) use of aspirin: Secondary | ICD-10-CM | POA: Diagnosis not present

## 2023-05-21 DIAGNOSIS — I509 Heart failure, unspecified: Secondary | ICD-10-CM | POA: Insufficient documentation

## 2023-05-21 DIAGNOSIS — M7989 Other specified soft tissue disorders: Secondary | ICD-10-CM | POA: Diagnosis present

## 2023-05-21 DIAGNOSIS — I808 Phlebitis and thrombophlebitis of other sites: Secondary | ICD-10-CM | POA: Diagnosis not present

## 2023-05-21 LAB — BPAM RBC
Blood Product Expiration Date: 202406192359
ISSUE DATE / TIME: 202405260118
Unit Type and Rh: 6200
Unit Type and Rh: 6200

## 2023-05-21 LAB — TYPE AND SCREEN: Unit division: 0

## 2023-05-21 MED ORDER — CEPHALEXIN 500 MG PO CAPS: 500.0000 mg | ORAL_CAPSULE | Freq: Four times a day (QID) | ORAL | 0 refills | Status: AC

## 2023-05-21 MED ORDER — CEPHALEXIN 500 MG PO CAPS
500.0000 mg | ORAL_CAPSULE | Freq: Once | ORAL | Status: AC
  Administered 2023-05-21: 500 mg via ORAL
  Filled 2023-05-21: qty 1

## 2023-05-21 MED ORDER — OXYCODONE-ACETAMINOPHEN 5-325 MG PO TABS
1.0000 | ORAL_TABLET | Freq: Once | ORAL | Status: AC
  Administered 2023-05-21: 1 via ORAL
  Filled 2023-05-21: qty 1

## 2023-05-21 NOTE — ED Triage Notes (Signed)
Pt arrives c/o swelling and redness to L antecubital fossa. Recently hospitalized for GI bleed, discharged on 5/26. Pt had an IV in the L AC and received 2 units of blood and iron infusion through it. States that she noted the swelling this AM, and has progressively worsened. Tried cold compresses at home without relief. Did not take any meds for pain prior to arrival. Denies hx of blood clots or thinners.

## 2023-05-21 NOTE — ED Provider Notes (Signed)
[place] EMERGENCY DEPARTMENT AT [facility] Provider Note   CSN: [id number] Arrival date & time: 05/21/23  1913     History  Chief Complaint  Patient presents with   Arm Swelling    Cynthia Best is a 67 y.o. female with past medical history significant for GI bleed, alcohol use, chronic pain syndrome, CHF, hyperlipidemia presents to the ED complaining of swelling, redness and pain to the left antecubital fossa that began this morning.  Patient was recently hospitalized for GI bleed and discharged 5/26.  She reports that she received all infusions and medications through the IV that was placed in her left arm.  She states her symptoms have rapidly progressed throughout the day.  She has tried cold compresses at home without relief.  She has not tried and medications.  Denies history of blood clots or being on blood thinners.  Patient is unable to take ASA or NSAIDs due to GI bleed.       Home Medications Prior to Admission medications   Medication Sig Start Date End Date Taking? Authorizing Provider  amphetamine-dextroamphetamine (ADDERALL) 30 MG tablet Take 1 tablet by mouth 2 times daily 02/14/22     aspirin EC 81 MG tablet Take 1 tablet (81 mg total) by mouth daily. Swallow whole. 06/01/23   Lewie Chamber, MD  atorvastatin (LIPITOR) 80 MG tablet Take 80 mg by mouth every evening.    [provider]  cephALEXin (KEFLEX) 500 MG capsule Take 1 capsule (500 mg total) by mouth 4 (four) times daily. 05/21/23  Yes Brinnley Lacap R, PA-C  cyclobenzaprine (FLEXERIL) 10 MG tablet Take 10 mg by mouth in the morning and at bedtime.    [provider]  ferrous sulfate 325 (65 FE) MG EC tablet Take 1 tablet (325 mg total) by mouth daily with breakfast. 05/18/23 05/17/24  Lewie Chamber, MD  FLUoxetine (PROZAC) 40 MG capsule Take 80 mg by mouth every evening.    [provider]  fluticasone (FLONASE) 50 MCG/ACT nasal spray Place 1 spray into both nostrils daily as  needed for allergies or rhinitis.    [provider]  gabapentin (NEURONTIN) 300 MG capsule Take 900 mg by mouth every morning.    [provider]  HYDROcodone-acetaminophen (NORCO/VICODIN) 5-325 MG tablet Take 1 tablet by mouth daily. 04/10/23   [provider]  levocetirizine (XYZAL) 5 MG tablet Take 5 mg by mouth every evening.    [provider]  nicotine (NICODERM CQ - DOSED IN MG/24 HOURS) 14 mg/24hr patch Place 1 patch (14 mg total) onto the skin daily as needed (smoking cessation). Patient not taking: Reported on 05/17/2023 03/03/23   Joylene Grapes, NP  ondansetron (ZOFRAN-ODT) 4 MG disintegrating tablet Take 4 mg by mouth every 6 (six) hours as needed for nausea or vomiting. 05/16/23   [provider]  pantoprazole (PROTONIX) 40 MG tablet Take 1 tablet (40 mg total) by mouth daily. 05/19/23   Lewie Chamber, MD  rOPINIRole (REQUIP) 3 MG tablet Take 3 mg by mouth in the morning and at bedtime.    [provider]  traZODone (DESYREL) 150 MG tablet Take 225 mg by mouth at bedtime. 12/24/22   [provider]  valACYclovir (VALTREX) 500 MG tablet Take 500 mg by mouth every evening.    [provider]  Vitamin D, Ergocalciferol, (DRISDOL) 1.25 MG (50000 UT) CAPS capsule Take 1 capsule (50,000 Units total) by mouth every 7 (seven) days. 12/13/18   Meredeth Ide,  Shea Stakes, NP      Allergies    Thimerosal (thiomersal) and Sulfa antibiotics    Review of Systems   Review of Systems  Physical Exam Updated Vital Signs BP (!) 163/79   Pulse 88   Temp 98.7 F (37.1 C) (Oral)   Resp 16   Ht 5\' 9"  (1.753 m)   Wt 68.2 kg   SpO2 95%   BMI 22.20 kg/m  Physical Exam Vitals and nursing note reviewed.  Constitutional:      General: She is not in acute distress.    Appearance: Normal appearance. She is not ill-appearing or diaphoretic.  Cardiovascular:     Rate and Rhythm: Normal rate and regular rhythm.     Pulses:          Radial  pulses are 2+ on the left side.  Pulmonary:     Effort: Pulmonary effort is normal.  Musculoskeletal:     Left elbow: Normal range of motion.     Comments: Swelling and erythema at the left antecubital fossa with exquisite tenderness to palpation.  Swelling extends to the elbow.  Normal flexion and extension of the elbow.  There is increased warmth and firmness along the central vein within the swollen area.  Erythema and swelling do not extend beyond the elbow.    Neurological:     Mental Status: She is alert. Mental status is at baseline.  Psychiatric:        Mood and Affect: Mood normal.        Behavior: Behavior normal.     ED Results / Procedures / Treatments   Labs (all labs ordered are listed, but only abnormal results are displayed) Labs Reviewed - No data to display  EKG None  Radiology No results found.  Procedures Procedures    Medications Ordered in ED Medications  oxyCODONE-acetaminophen (PERCOCET/ROXICET) 5-325 MG per tablet 1 tablet (1 tablet Oral Given 05/21/23 2037)  cephALEXin (KEFLEX) capsule 500 mg (500 mg Oral Given 05/21/23 2037)    ED Course/ Medical Decision Making/ A&P                             Medical Decision Making Risk Prescription drug management.   This patient presents to the ED with chief complaint(s) of redness, swelling, pain to left arm after IV infusions with pertinent past medical history of GI bleed.  The complaint involves an extensive differential diagnosis and also carries with it a high risk of complications and morbidity.    The differential diagnosis includes superficial phlebitis, cellulitis, IV infusion injury   Initial Assessment:   Exam significant for erythema, increased warmth, and swelling to the left antecubital fossa.  Swelling extends to the elbow.  There is firmness over the center vein within the swelling and erythema.  Area is exquisitely tender to palpation.  She has normal flexion and extension of the elbow.   Radial pulse is 2+.  Erythema does not extend proximally or distally on the left upper extremity.    Treatment and Reassessment: Patient given pain medicine with improvement in her symptoms.  She was also given first dose of antibiotic while in ED.    Disposition:   Patient's symptoms are consistent with phlebitis, but will also cover for possible cellulitis given the amount of redness, swelling, and pain patient is having and how rapidly it progressed.  Patient has been out of the hospital for 3 days and symptoms began  today.  Advised patient to follow up with her primary care provider.  Unable to give patient NSAIDs due to recent GI bleed.  Recommended Tylenol.  Patient appears to be under pain management after PDMP review.    The patient has been appropriately medically screened and/or stabilized in the ED. I have low suspicion for any other emergent medical condition which would require further screening, evaluation or treatment in the ED or require inpatient management. At time of discharge the patient is hemodynamically stable and in no acute distress. I have discussed work-up results and diagnosis with patient and answered all questions. Patient is agreeable with discharge plan. We discussed strict return precautions for returning to the emergency department and they verbalized understanding.    Social Determinants of Health:   Patient's  tobacco use, alcohol use   increases the complexity of managing their presentation         Final Clinical Impression(s) / ED Diagnoses Final diagnoses:  Phlebitis after infusion, initial encounter    Rx / DC Orders ED Discharge Orders          Ordered    cephALEXin (KEFLEX) 500 MG capsule  4 times daily        05/21/23 2108              Lenard Simmer, PA-C 05/21/23 2120    Charlynne Pander, MD 05/21/23 2303

## 2023-05-21 NOTE — Discharge Instructions (Addendum)
Thank you for allowing me to be a part of your care today.   I have sent an antibiotic to your pharmacy to treat possible infectious cause of your left arm swelling, pain, and redness.  I have also attached information about both cellulitis and phlebitis.  I recommend using warm compresses throughout the day for no longer than 20 minutes at a time.  Always use a barrier between the heat and your skin.   I also recommend propping your arm up when possible.  You may take 1000 mg of Tylenol every 6 hours as needed for pain and swelling.  DO NOT exceed 4000 mg of Tylenol from all sources in a 24-hour period.    Return to the ED if you develop sudden worsening of your symptoms or if you have any new concerns.

## 2023-05-22 DIAGNOSIS — E782 Mixed hyperlipidemia: Secondary | ICD-10-CM | POA: Diagnosis not present

## 2023-05-22 DIAGNOSIS — F331 Major depressive disorder, recurrent, moderate: Secondary | ICD-10-CM | POA: Diagnosis not present

## 2023-05-22 DIAGNOSIS — E559 Vitamin D deficiency, unspecified: Secondary | ICD-10-CM | POA: Diagnosis not present

## 2023-06-02 NOTE — Progress Notes (Unsigned)
Office Visit    Patient Name: Cynthia Best Date of Encounter: 06/03/2023  Primary Care Provider:  [facility] Cardiologist:  Nicki Guadalajara, MD  Chief Complaint    67 year old female with a history of atypical chest pain, LVH, hyperlipidemia, ADHD, cervical radiculopathy, GI bleed, tobacco use, and GERD who presents for follow-up related to dizziness.   Past Medical History    Past Medical History:  Diagnosis Date   Arthritis    Depression    Herpes    HLD (hyperlipidemia)    Scoliosis    Spondylolysis of cervical region    Past Surgical History:  Procedure Laterality Date   ABDOMINAL HYSTERECTOMY  01/14/2006   APPENDECTOMY  01/14/2005   ESOPHAGOGASTRODUODENOSCOPY (EGD) WITH PROPOFOL N/A 05/18/2023   Procedure: ESOPHAGOGASTRODUODENOSCOPY (EGD) WITH PROPOFOL;  Surgeon: Vida Rigger, MD;  Location: [facility];  Service: Gastroenterology;  Laterality: N/A;   HOT HEMOSTASIS N/A 05/18/2023   Procedure: HOT HEMOSTASIS (ARGON PLASMA COAGULATION/BICAP);  Surgeon: Vida Rigger, MD;  Location: [facility];  Service: Gastroenterology;  Laterality: N/A;   LEFT HEART CATH AND CORONARY ANGIOGRAPHY N/A 02/17/2023   Procedure: LEFT HEART CATH AND CORONARY ANGIOGRAPHY;  Surgeon: Runell Gess, MD;  Location: [facility];  Service: Cardiovascular;  Laterality: N/A;   Myoectomy     PLACEMENT OF BREAST IMPLANTS     VAGINAL PROLAPSE REPAIR  04/14/2006    Allergies  Allergies  Allergen Reactions   Thimerosal Swelling    In eyes, due to thimerosal in contact solution   Thimerosal (Thiomersal) Swelling    In eyes, due to thimerosal in contact solution    Other     Other Reaction(s): Other (See Comments)   Amoxicillin-Pot Clavulanate Diarrhea    Other Reaction(s): Diarrhea   Sulfa Antibiotics Other (See Comments)    Doesn't remember    Sulfasalazine     Other Reaction(s): GI Intolerance  Doesn't remember     Labs/Other Studies Reviewed     The following studies were reviewed today:  Cardiac Studies & Procedures   CARDIAC CATHETERIZATION  CARDIAC CATHETERIZATION 02/17/2023  Narrative Images from the original result were not included.    The left ventricular systolic function is normal.   LV end diastolic pressure is mildly elevated.   The left ventricular ejection fraction is 55-65% by visual estimate.  Cynthia Best is a 67 y.o. female   [id number] LOCATION:  FACILITY: [facility] PHYSICIAN: Nanetta Batty, M.D. 06/14/1956   DATE OF PROCEDURE:  02/17/2023  DATE OF DISCHARGE:     CARDIAC CATHETERIZATION    History obtained from chart review.Cynthia Best is a 67 y.o. female with a hx of smoking, FHx of CAD, HLD, GERD, ADHD, cervical radiculopathy who is being seen 02/17/2023 for the evaluation of chest pain at the request of the ED. her enzymes were negative.  Her EKG showed no acute changes.  She was referred for diagnostic coronary angiography to rule out an ischemic etiology.    PROCEDURE DESCRIPTION:  The patient was brought to the second floor [facility] cath lab in the postabsorptive state.  She was premedicated with IV Versed and fentanyl.  Her right wrist and right groin Were prepped and shaved in usual sterile fashion. Xylocaine 1% was used for local anesthesia. A 6 French sheath was inserted into the right Radial artery using standard Seldinger technique.  There was resistance with advancing the diagnostic catheter.  A angiogram was performed that showed a radial loop  and I therefore switched to femoral approach.  A 5 French sheath was inserted into the right common femoral artery using standard Seldinger technique.  5 French right left second sinus the catheters as well as a 3 DRC catheter used for selective coronary angiography and left ventriculography respectively.  Isovue dye is used for the entirety of the case (70 cc of contrast total patient).  Retrograde ordered, ventricular and pullback  pressures were recorded.  LVEDP was measured at 25.  The patient did receive 3500 units of IV heparin because of her radial sheath.  Impression Ms. Dicarlo had normal coronary arteries and normal LV function.  She did have a right radial loop precluding radial cath.  I did place a Mynx closure device in her right common femoral artery achieving hemostasis.  The patient left lab in stable condition.  Her chest pain was most likely noncardiac.  I suspect that she will be stable for discharge home later today.  I have communicated this to her attending cardiologist, Dr. Daphene Jaeger.  Nanetta Batty. MD, [facility] 02/17/2023 10:46 AM  Findings Coronary Findings Diagnostic  Dominance: Right  No diagnostic findings have been documented. Intervention  No interventions have been documented.     ECHOCARDIOGRAM  ECHOCARDIOGRAM COMPLETE 04/02/2023  Narrative ECHOCARDIOGRAM REPORT    Patient Name:   Cynthia Best  Date of Exam: 04/02/2023 Medical Rec #:  [id number]     Height:       69.0 in Accession #:    [id number]    Weight:       153.6 lb Date of Birth:  1956-10-02      BSA:          1.847 m Patient Age:    66 years      BP:           124/78 mmHg Patient Gender: F             HR:           83 bpm. Exam Location:  [address]  Procedure: 2D Echo, Cardiac Doppler and Color Doppler  Indications:    R55 Syncope  History:        Patient has prior history of Echocardiogram examinations, most recent 02/17/2023. Risk Factors:Dyslipidemia, Alcohol use and Former Smoker. Scoliosis.  Sonographer:    Sedonia Small Rodgers-Jones RDCS Referring Phys: [id number] Elexis Pollak C Nasean Zapf   Sonographer Comments: Patient unable to comprehend various valsalva maneuvers. Gradients may be higher if valsalva was successful. IMPRESSIONS   1. Left ventricular ejection fraction, by estimation, is >75%. The left ventricle has hyperdynamic function. The left ventricle has no regional wall motion abnormalities. There is mild asymmetric  left ventricular hypertrophy of the basal-septal segment. Left ventricular diastolic parameters are consistent with Grade I diastolic dysfunction (impaired relaxation). 2. There is flow acceleration at the level of the LV outflow tract likely due to hyperdynamic LV as well as basal septal hypertrophy. Possible late SAM based on color doppler. No AS visualized. Peak LVOT gradient at rest ~8mmHg that increases to ~55mmHg with valsalva. Suspect this is related to hyperdynamic state with sigmoid septum, but could consider CMR for evaluation of HOCM as noted in prior echo. 3. Right ventricular systolic function is normal. The right ventricular size is normal. 4. The mitral valve is grossly normal. Mild mitral valve regurgitation. 5. The aortic valve is tricuspid. Aortic valve regurgitation is not visualized. Aortic valve sclerosis is present, with no evidence of aortic valve stenosis. 6. The inferior vena  cava is normal in size with greater than 50% respiratory variability, suggesting right atrial pressure of 3 mmHg.  Comparison(s): Compared to prior study, there is no significant change. There continues to be hyperdynamic LV systolic function with LVOT gradient. Suspect late systolic SAM. May be related to sigmoid septum and hyperdynamic, small LV but could consider CMR for work-up of HOCM.  FINDINGS Left Ventricle: There is flow acceleration at the level of the LV outflow tract likely due to hyperdynamic LV as well as basal septal hypertrophy. Possible late SAM based on color doppler. No AS visualized. Peak LVOT gradient at rest ~18mmHg that increases to ~27mmHg with valsalva. Left ventricular ejection fraction, by estimation, is >75%. The left ventricle has hyperdynamic function. The left ventricle has no regional wall motion abnormalities. The left ventricular internal cavity size was normal in size. There is mild asymmetric left ventricular hypertrophy of the basal-septal segment. Left ventricular  diastolic parameters are consistent with Grade I diastolic dysfunction (impaired relaxation).  Right Ventricle: The right ventricular size is normal. No increase in right ventricular wall thickness. Right ventricular systolic function is normal.  Left Atrium: Left atrial size was normal in size.  Right Atrium: Right atrial size was normal in size.  Pericardium: There is no evidence of pericardial effusion.  Mitral Valve: The mitral valve is grossly normal. Mild mitral valve regurgitation.  Tricuspid Valve: The tricuspid valve is normal in structure. Tricuspid valve regurgitation is trivial.  Aortic Valve: The aortic valve is tricuspid. Aortic valve regurgitation is not visualized. Aortic valve sclerosis is present, with no evidence of aortic valve stenosis. Aortic valve mean gradient measures 10.7 mmHg. Aortic valve peak gradient measures 19.2 mmHg.  Pulmonic Valve: The pulmonic valve was normal in structure. Pulmonic valve regurgitation is trivial.  Aorta: The aortic root is normal in size and structure.  Venous: The inferior vena cava is normal in size with greater than 50% respiratory variability, suggesting right atrial pressure of 3 mmHg.  IAS/Shunts: The atrial septum is grossly normal.   LEFT VENTRICLE PLAX 2D LVIDd:         4.50 cm   Diastology LVIDs:         2.90 cm   LV e' medial:    6.04 cm/s LV PW:         0.90 cm   LV E/e' medial:  15.7 LV IVS:        0.70 cm   LV e' lateral:   7.94 cm/s LVOT diam:     1.90 cm   LV E/e' lateral: 11.9 LVOT Area:     2.84 cm   RIGHT VENTRICLE             IVC RV Basal diam:  3.20 cm     IVC diam: 1.70 cm RV S prime:     14.95 cm/s TAPSE (M-mode): 2.6 cm  LEFT ATRIUM             Index        RIGHT ATRIUM          Index LA diam:        4.30 cm 2.33 cm/m   RA Area:     9.76 cm LA Vol (A2C):   50.8 ml 27.51 ml/m  RA Volume:   22.40 ml 12.13 ml/m LA Vol (A4C):   49.2 ml 26.64 ml/m LA Biplane Vol: 52.0 ml 28.16 ml/m AORTIC  VALVE AV Vmax:      219.00 cm/s AV Vmean:  152.000 cm/s AV VTI:       0.458 m AV Peak Grad: 19.2 mmHg AV Mean Grad: 10.7 mmHg  AORTA Ao Root diam: 3.00 cm  MITRAL VALVE MV Area (PHT): 3.75 cm     SHUNTS MV Decel Time: 203 msec     Systemic Diam: 1.90 cm MR Peak grad: 104.4 mmHg MR Vmax:      511.00 cm/s MV E velocity: 94.60 cm/s MV A velocity: 103.00 cm/s MV E/A ratio:  0.92  Laurance Flatten MD Electronically signed by Laurance Flatten MD Signature Date/Time: 04/02/2023/1:26:50 PM    Final            Recent Labs: 02/17/2023: ALT 39; Magnesium 1.9 05/17/2023: Platelets 438 05/18/2023: B Natriuretic Peptide 252.3; BUN 12; Creatinine, Ser 0.70; Hemoglobin 8.3; Potassium 3.9; Sodium 141  Recent Lipid Panel    Component Value Date/Time   CHOL 247 (H) 12/11/2018 1709   TRIG 149 12/11/2018 1709   HDL 64 12/11/2018 1709   CHOLHDL 3.9 12/11/2018 1709   CHOLHDL 3.0 01/24/2017 1520   VLDL 17 01/24/2017 1520   LDLCALC 153 (H) 12/11/2018 1709    History of Present Illness    67 year old female with the above past medical history including atypical chest pain, LVH, hyperlipidemia, ADHD, cervical radiculopathy, GI bleed, tobacco use, and GERD.    She has a family history of premature CAD in her father. She presented to the ED on 02/17/2023 with 1 week history of intermittent chest pain.  EKG was nonischemic, troponin was minimally elevated.  Cardiology was consulted.  She underwent cardiac catheterization on 02/17/2023 which revealed normal coronary arteries, EF 55 to 65%.  Echocardiogram showed EF 65 to 70%, hyperdynamic LV function, no RWMA, mild LVH of basal septal segment, G1 DD, normal RV systolic function, moderate LAE, aortic valve sclerosis without evidence of stenosis.  Repeat echocardiogram was recommended following volume repletion with proactive maneuvers to better evaluate LVOT gradient. Her chest discomfort was thought to be related to GERD and/or esophageal spasm,  possible side effect of high-dose of stimulant medications.  She was last seen in the office on 03/03/2023 and was stable overall from a cardiac standpoint.  She denied symptoms concerning for angina.  She did note occasional lightheadedness, dizziness.  Repeat echocardiogram showed EF greater than 75%, hyperdynamic LV function, no RWMA, mild asymmetric LVH, G1 DD, fluctuation in the level of LV outflow tract likely due to hyperdynamic LV as well as possible apical septal hypertrophy, mild mitral valve regurgitation.  No significant change from prior study, no further testing was recommended. Carotid ultrasound showed no evidence of significant carotid artery stenosis.  She was hospitalized in May 2024 in the setting of GI bleed, hemoglobin was 5.8 on admission.  GI was consulted and she underwent EGD which revealed small angiodysplastic lesion without bleeding in the second portion of the duodenum from treated with APC).  Aspirin was held for 2 weeks.  Hemoglobin improved to 8.3 following 2 units PRBC. Outpatient follow-up with GI was recommended. She was seen in the ED on 05/21/2023 in the setting of left forearm swelling, pain.  She was treated with antibiotics for given concern for phlebitis and discharged home in stable condition.  She presents today for follow-up. Since her last visit and since her recent hospitalization she has been stable overall from a cardiac standpoint.  She denies any further lightheadedness, dizziness, presyncope or syncope.  Denies chest pain, dyspnea.  Overall, she reports feeling well.  Home Medications  Current Outpatient Medications  Medication Sig Dispense Refill   amphetamine-dextroamphetamine (ADDERALL) 30 MG tablet Take 1 tablet by mouth 2 times daily 60 tablet 0   aspirin EC 81 MG tablet Take 1 tablet (81 mg total) by mouth daily. Swallow whole. 30 tablet 12   atorvastatin (LIPITOR) 80 MG tablet Take 80 mg by mouth every evening.     cyclobenzaprine (FLEXERIL) 10 MG  tablet Take 10 mg by mouth in the morning and at bedtime.     ferrous sulfate 325 (65 FE) MG EC tablet Take 1 tablet (325 mg total) by mouth daily with breakfast.     FLUoxetine (PROZAC) 40 MG capsule Take 80 mg by mouth every evening.     fluticasone (FLONASE) 50 MCG/ACT nasal spray Place 1 spray into both nostrils daily as needed for allergies or rhinitis.     gabapentin (NEURONTIN) 300 MG capsule Take 900 mg by mouth every morning.     HYDROcodone-acetaminophen (NORCO/VICODIN) 5-325 MG tablet Take 1 tablet by mouth daily.     levocetirizine (XYZAL) 5 MG tablet Take 5 mg by mouth every evening.     pantoprazole (PROTONIX) 40 MG tablet Take 1 tablet (40 mg total) by mouth daily. 30 tablet 3   rOPINIRole (REQUIP) 3 MG tablet Take 3 mg by mouth in the morning and at bedtime.     traZODone (DESYREL) 150 MG tablet Take 225 mg by mouth at bedtime.     valACYclovir (VALTREX) 500 MG tablet Take 500 mg by mouth every evening.     Vitamin D, Ergocalciferol, (DRISDOL) 1.25 MG (50000 UT) CAPS capsule Take 1 capsule (50,000 Units total) by mouth every 7 (seven) days. 12 capsule 1   cephALEXin (KEFLEX) 500 MG capsule Take 1 capsule (500 mg total) by mouth 4 (four) times daily. (Patient not taking: Reported on 06/03/2023) 20 capsule 0   nicotine (NICODERM CQ - DOSED IN MG/24 HOURS) 14 mg/24hr patch Place 1 patch (14 mg total) onto the skin daily as needed (smoking cessation). (Patient not taking: Reported on 06/03/2023) 28 patch 2   ondansetron (ZOFRAN-ODT) 4 MG disintegrating tablet Take 4 mg by mouth every 6 (six) hours as needed for nausea or vomiting. (Patient not taking: Reported on 06/03/2023)     No current facility-administered medications for this visit.     Review of Systems    She denies chest pain, palpitations, dyspnea, pnd, orthopnea, n, v, dizziness, syncope, edema, weight gain, or early satiety. All other systems reviewed and are otherwise negative except as noted above.   Physical Exam     VS:  BP 128/76 (BP Location: Left Arm, Patient Position: Sitting, Cuff Size: Normal)   Pulse 93   Ht 5\' 9"  (1.753 m)   Wt 147 lb (66.7 kg)   SpO2 95%   BMI 21.71 kg/m   GEN: Well nourished, well developed, in no acute distress. HEENT: normal. Neck: Supple, no JVD, carotid bruits, or masses. Cardiac: RRR,  3/6 murmur, no rubs, or gallops. No clubbing, cyanosis, edema.  Radials/DP/PT 2+ and equal bilaterally.  Respiratory:  Respirations regular and unlabored, clear to auscultation bilaterally. GI: Soft, nontender, nondistended, BS + x 4. MS: no deformity or atrophy. Skin: warm and dry, no rash. Neuro:  Strength and sensation are intact. Psych: Normal affect.  Accessory Clinical Findings    ECG personally reviewed by me today - No EKG in office today.    Lab Results  Component Value Date   WBC 5.7 05/17/2023   HGB  8.3 (L) 05/18/2023   HCT 25.1 (L) 05/18/2023   MCV 85.0 05/17/2023   PLT 438 (H) 05/17/2023   Lab Results  Component Value Date   CREATININE 0.70 05/18/2023   BUN 12 05/18/2023   NA 141 05/18/2023   K 3.9 05/18/2023   CL 106 05/18/2023   CO2 25 05/17/2023   Lab Results  Component Value Date   ALT 39 02/17/2023   AST 29 02/17/2023   ALKPHOS 57 02/17/2023   BILITOT 0.6 02/17/2023   Lab Results  Component Value Date   CHOL 247 (H) 12/11/2018   HDL 64 12/11/2018   LDLCALC 153 (H) 12/11/2018   TRIG 149 12/11/2018   CHOLHDL 3.9 12/11/2018    Lab Results  Component Value Date   HGBA1C 5.8 (H) 03/28/2015    Assessment & Plan    1. Chest pain:  Cath on 02/17/2023 in the setting of intermittent chest pain, mildly  elevated troponin revealed normal coronary arteries, EF 55 to 65%. Stable with no anginal symptoms.  Continue aspirin, Lipitor.   2. Lightheadedness/dizziness/LVH: Echo in 03/2023 showed EF greater than 75%, hyperdynamic LV function, no RWMA, mild asymmetric LVH, G1 DD, fluctuation in the level of LV outflow tract likely due to hyperdynamic LV as  well as possible apical septal hypertrophy, mild mitral valve regurgitation.  No significant change from prior study, no further testing was recommended. Carotid ultrasound showed no evidence of significant carotid artery stenosis.  Her lightheadedness/dizziness improved with stabilization of hemoglobin.  Should she have future symptoms, could consider cardiac MRI as below.  Encouraged adequate hydration.  3. Hyperlipidemia: No recent LDL on file.  Will update fasting lipid panel, CMET.  Continue Lipitor.                  4. GI bleed: Recent hospitalization for same, s/p 2 units PRBC, IV iron.  Denies any recent bleeding.  Will update CBC.  Recommend follow-up with GI.  5. Tobacco use: She is no longer smoking.  I congratulated her on this.  6. Disposition: Follow-up in 6 months.       Joylene Grapes, NP 06/03/2023, 12:26 PM

## 2023-06-03 ENCOUNTER — Ambulatory Visit: Payer: Medicare HMO | Attending: Nurse Practitioner | Admitting: Nurse Practitioner

## 2023-06-03 ENCOUNTER — Encounter: Payer: Self-pay | Admitting: Nurse Practitioner

## 2023-06-03 VITALS — BP 128/76 | HR 93 | Ht 69.0 in | Wt 147.0 lb

## 2023-06-03 DIAGNOSIS — Z8719 Personal history of other diseases of the digestive system: Secondary | ICD-10-CM

## 2023-06-03 DIAGNOSIS — E782 Mixed hyperlipidemia: Secondary | ICD-10-CM

## 2023-06-03 DIAGNOSIS — R0789 Other chest pain: Secondary | ICD-10-CM | POA: Diagnosis not present

## 2023-06-03 DIAGNOSIS — R42 Dizziness and giddiness: Secondary | ICD-10-CM | POA: Diagnosis not present

## 2023-06-03 DIAGNOSIS — I517 Cardiomegaly: Secondary | ICD-10-CM | POA: Diagnosis not present

## 2023-06-03 DIAGNOSIS — Z720 Tobacco use: Secondary | ICD-10-CM

## 2023-06-03 NOTE — Patient Instructions (Addendum)
Medication Instructions:  Your physician recommends that you continue on your current medications as directed. Please refer to the Current Medication list given to you today.  *If you need a refill on your cardiac medications before your next appointment, please call your pharmacy*   Lab Work: Your physician recommends that you return for lab work at your convenience. CMET, CBC, Fasting lipid panel.    Testing/Procedures: NONE ordered at this time of appointment     Follow-Up: At [facility], you and your health needs are our priority.  As part of our continuing mission to provide you with exceptional heart care, we have created designated Provider Care Teams.  These Care Teams include your primary Cardiologist (physician) and Advanced Practice Providers (APPs -  Physician Assistants and Nurse Practitioners) who all work together to provide you with the care you need, when you need it.  We recommend signing up for the patient portal called "MyChart".  Sign up information is provided on this After Visit Summary.  MyChart is used to connect with patients for Virtual Visits (Telemedicine).  Patients are able to view lab/test results, encounter notes, upcoming appointments, etc.  Non-urgent messages can be sent to your provider as well.   To learn more about what you can do with MyChart, go to [email or web address].    Your next appointment:   6 month(s)  Provider:   Nicki Guadalajara, MD     Other Instructions

## 2023-06-07 LAB — COMPREHENSIVE METABOLIC PANEL
ALT: 28 IU/L (ref 0–32)
AST: 25 IU/L (ref 0–40)
Albumin/Globulin Ratio: 1.8
Albumin: 4.4 g/dL (ref 3.9–4.9)
Alkaline Phosphatase: 90 IU/L (ref 44–121)
BUN/Creatinine Ratio: 26 (ref 12–28)
BUN: 20 mg/dL (ref 8–27)
Bilirubin Total: 0.3 mg/dL (ref 0.0–1.2)
CO2: 25 mmol/L (ref 20–29)
Calcium: 9.6 mg/dL (ref 8.7–10.3)
Chloride: 104 mmol/L (ref 96–106)
Creatinine, Ser: 0.76 mg/dL (ref 0.57–1.00)
Globulin, Total: 2.4 g/dL (ref 1.5–4.5)
Glucose: 95 mg/dL (ref 70–99)
Potassium: 4.4 mmol/L (ref 3.5–5.2)
Sodium: 139 mmol/L (ref 134–144)
Total Protein: 6.8 g/dL (ref 6.0–8.5)
eGFR: 86 mL/min/{1.73_m2} (ref >59)

## 2023-06-07 LAB — CBC
Hematocrit: 33.3 % — ABNORMAL LOW (ref 34.0–46.6)
Hemoglobin: 10.5 g/dL — ABNORMAL LOW (ref 11.1–15.9)
MCH: 28.4 pg (ref 26.6–33.0)
MCHC: 31.5 g/dL (ref 31.5–35.7)
MCV: 90 fL (ref 79–97)
Platelets: 379 10*3/uL (ref 150–450)
RBC: 3.7 x10E6/uL — ABNORMAL LOW (ref 3.77–5.28)
RDW: 18 % — ABNORMAL HIGH (ref 11.7–15.4)
WBC: 4.7 10*3/uL (ref 3.4–10.8)

## 2023-06-07 LAB — LIPID PANEL
Chol/HDL Ratio: 3.1 ratio (ref 0.0–4.4)
Cholesterol, Total: 151 mg/dL (ref 100–199)
HDL: 48 mg/dL (ref >39)
LDL Chol Calc (NIH): 79 mg/dL (ref 0–99)
Triglycerides: 137 mg/dL (ref 0–149)
VLDL Cholesterol Cal: 24 mg/dL (ref 5–40)

## 2023-06-11 ENCOUNTER — Telehealth: Payer: Self-pay

## 2023-06-11 NOTE — Telephone Encounter (Signed)
Lmom to discuss lab results. Waiting on a return call.  

## 2023-06-17 NOTE — Telephone Encounter (Signed)
Letter mailed with lab results

## 2023-07-08 ENCOUNTER — Other Ambulatory Visit: Payer: Self-pay | Admitting: Nurse Practitioner

## 2023-07-08 DIAGNOSIS — M064 Inflammatory polyarthropathy: Secondary | ICD-10-CM

## 2023-12-03 ENCOUNTER — Ambulatory Visit: Payer: Medicare HMO | Admitting: Cardiovascular Disease
